# Patient Record
Sex: Male | Born: 1935 | Race: Black or African American | Hispanic: No | Marital: Married | Smoking: Former smoker
Health system: Southern US, Community
[De-identification: ages and names within clinical notes are randomized; demographics above are authoritative.]

## PROBLEM LIST (undated history)

## (undated) DIAGNOSIS — I2510 Atherosclerotic heart disease of native coronary artery without angina pectoris: Secondary | ICD-10-CM

## (undated) DIAGNOSIS — M069 Rheumatoid arthritis, unspecified: Secondary | ICD-10-CM

## (undated) DIAGNOSIS — I509 Heart failure, unspecified: Secondary | ICD-10-CM

## (undated) DIAGNOSIS — Z860101 Personal history of adenomatous and serrated colon polyps: Secondary | ICD-10-CM

## (undated) DIAGNOSIS — I10 Essential (primary) hypertension: Secondary | ICD-10-CM

## (undated) DIAGNOSIS — Z86010 Personal history of colonic polyps: Secondary | ICD-10-CM

## (undated) DIAGNOSIS — N289 Disorder of kidney and ureter, unspecified: Secondary | ICD-10-CM

## (undated) DIAGNOSIS — E785 Hyperlipidemia, unspecified: Secondary | ICD-10-CM

## (undated) DIAGNOSIS — R911 Solitary pulmonary nodule: Secondary | ICD-10-CM

## (undated) DIAGNOSIS — I219 Acute myocardial infarction, unspecified: Secondary | ICD-10-CM

## (undated) HISTORY — DX: Rheumatoid arthritis, unspecified: M06.9

## (undated) HISTORY — DX: Heart failure, unspecified: I50.9

## (undated) HISTORY — DX: Disorder of kidney and ureter, unspecified: N28.9

## (undated) HISTORY — PX: HERNIA REPAIR: SHX51

## (undated) HISTORY — DX: Personal history of adenomatous and serrated colon polyps: Z86.0101

## (undated) HISTORY — DX: Personal history of colonic polyps: Z86.010

## (undated) HISTORY — DX: Hyperlipidemia, unspecified: E78.5

## (undated) HISTORY — DX: Acute myocardial infarction, unspecified: I21.9

## (undated) HISTORY — PX: ANGIOPLASTY: SHX39

## (undated) HISTORY — DX: Essential (primary) hypertension: I10

## (undated) HISTORY — DX: Atherosclerotic heart disease of native coronary artery without angina pectoris: I25.10

## (undated) HISTORY — DX: Solitary pulmonary nodule: R91.1

---

## 1998-03-05 ENCOUNTER — Ambulatory Visit (HOSPITAL_COMMUNITY): Admission: RE | Admit: 1998-03-05 | Discharge: 1998-03-05 | Payer: Self-pay | Admitting: Nephrology

## 1998-04-20 ENCOUNTER — Ambulatory Visit (HOSPITAL_COMMUNITY): Admission: RE | Admit: 1998-04-20 | Discharge: 1998-04-20 | Payer: Self-pay | Admitting: Nephrology

## 1999-02-23 ENCOUNTER — Encounter: Payer: Self-pay | Admitting: Internal Medicine

## 2000-02-15 ENCOUNTER — Ambulatory Visit (HOSPITAL_COMMUNITY): Admission: RE | Admit: 2000-02-15 | Discharge: 2000-02-15 | Payer: Self-pay | Admitting: Internal Medicine

## 2000-02-15 ENCOUNTER — Encounter: Payer: Self-pay | Admitting: Internal Medicine

## 2000-02-23 ENCOUNTER — Ambulatory Visit (HOSPITAL_COMMUNITY): Admission: RE | Admit: 2000-02-23 | Discharge: 2000-02-23 | Payer: Self-pay | Admitting: Internal Medicine

## 2000-02-23 ENCOUNTER — Encounter: Payer: Self-pay | Admitting: Internal Medicine

## 2000-03-20 ENCOUNTER — Encounter: Admission: RE | Admit: 2000-03-20 | Discharge: 2000-04-05 | Payer: Self-pay | Admitting: Internal Medicine

## 2000-04-04 ENCOUNTER — Encounter: Admission: RE | Admit: 2000-04-04 | Discharge: 2000-07-03 | Payer: Self-pay | Admitting: Internal Medicine

## 2000-10-09 HISTORY — PX: CORONARY STENT PLACEMENT: SHX1402

## 2000-11-23 ENCOUNTER — Inpatient Hospital Stay (HOSPITAL_COMMUNITY): Admission: EM | Admit: 2000-11-23 | Discharge: 2000-11-29 | Payer: Self-pay | Admitting: Emergency Medicine

## 2000-11-23 ENCOUNTER — Encounter: Payer: Self-pay | Admitting: Emergency Medicine

## 2000-11-27 ENCOUNTER — Encounter: Payer: Self-pay | Admitting: *Deleted

## 2004-09-15 ENCOUNTER — Ambulatory Visit: Payer: Self-pay | Admitting: Internal Medicine

## 2005-01-12 ENCOUNTER — Ambulatory Visit: Payer: Self-pay | Admitting: Internal Medicine

## 2005-02-01 ENCOUNTER — Ambulatory Visit: Payer: Self-pay | Admitting: Internal Medicine

## 2005-04-26 ENCOUNTER — Ambulatory Visit: Payer: Self-pay | Admitting: Internal Medicine

## 2005-05-03 ENCOUNTER — Ambulatory Visit: Payer: Self-pay | Admitting: Internal Medicine

## 2005-06-13 ENCOUNTER — Ambulatory Visit: Payer: Self-pay | Admitting: Internal Medicine

## 2005-06-15 ENCOUNTER — Encounter: Admission: RE | Admit: 2005-06-15 | Discharge: 2005-06-15 | Payer: Self-pay | Admitting: Internal Medicine

## 2005-06-19 ENCOUNTER — Ambulatory Visit: Payer: Self-pay | Admitting: Internal Medicine

## 2005-08-23 ENCOUNTER — Ambulatory Visit: Payer: Self-pay | Admitting: Internal Medicine

## 2005-12-22 ENCOUNTER — Ambulatory Visit: Payer: Self-pay | Admitting: Internal Medicine

## 2006-02-01 ENCOUNTER — Ambulatory Visit: Payer: Self-pay | Admitting: Internal Medicine

## 2006-03-22 ENCOUNTER — Ambulatory Visit: Payer: Self-pay | Admitting: Internal Medicine

## 2006-03-29 ENCOUNTER — Ambulatory Visit: Payer: Self-pay | Admitting: Internal Medicine

## 2006-07-11 ENCOUNTER — Encounter: Payer: Self-pay | Admitting: Internal Medicine

## 2006-07-11 ENCOUNTER — Ambulatory Visit: Payer: Self-pay | Admitting: Internal Medicine

## 2006-07-11 DIAGNOSIS — E785 Hyperlipidemia, unspecified: Secondary | ICD-10-CM

## 2006-07-11 DIAGNOSIS — I252 Old myocardial infarction: Secondary | ICD-10-CM

## 2006-07-11 DIAGNOSIS — I10 Essential (primary) hypertension: Secondary | ICD-10-CM | POA: Insufficient documentation

## 2006-07-11 DIAGNOSIS — I509 Heart failure, unspecified: Secondary | ICD-10-CM | POA: Insufficient documentation

## 2006-07-11 DIAGNOSIS — E118 Type 2 diabetes mellitus with unspecified complications: Secondary | ICD-10-CM

## 2006-07-11 DIAGNOSIS — E1165 Type 2 diabetes mellitus with hyperglycemia: Secondary | ICD-10-CM

## 2006-10-23 ENCOUNTER — Ambulatory Visit: Payer: Self-pay | Admitting: Internal Medicine

## 2006-10-23 LAB — CONVERTED CEMR LAB
ALT: 17 units/L (ref 0–40)
AST: 20 units/L (ref 0–37)
Albumin: 3.3 g/dL — ABNORMAL LOW (ref 3.5–5.2)
Alkaline Phosphatase: 63 units/L (ref 39–117)
BUN: 24 mg/dL — ABNORMAL HIGH (ref 6–23)
Bilirubin, Direct: 0.2 mg/dL (ref 0.0–0.3)
CO2: 27 meq/L (ref 19–32)
Calcium: 9 mg/dL (ref 8.4–10.5)
Chloride: 101 meq/L (ref 96–112)
Cholesterol: 145 mg/dL (ref 0–200)
Creatinine, Ser: 1.7 mg/dL — ABNORMAL HIGH (ref 0.4–1.5)
GFR calc Af Amer: 51 mL/min
GFR calc non Af Amer: 42 mL/min
Glucose, Bld: 134 mg/dL — ABNORMAL HIGH (ref 70–99)
HDL: 29.9 mg/dL — ABNORMAL LOW (ref >39.0)
Hgb A1c MFr Bld: 6.9 % — ABNORMAL HIGH (ref 4.6–6.0)
LDL Cholesterol: 87 mg/dL (ref 0–99)
Potassium: 4 meq/L (ref 3.5–5.1)
Sodium: 136 meq/L (ref 135–145)
Total Bilirubin: 1 mg/dL (ref 0.3–1.2)
Total CHOL/HDL Ratio: 4.8
Total Protein: 7.1 g/dL (ref 6.0–8.3)
Triglycerides: 141 mg/dL (ref 0–149)
VLDL: 28 mg/dL (ref 0–40)

## 2007-01-23 ENCOUNTER — Ambulatory Visit: Payer: Self-pay | Admitting: Internal Medicine

## 2007-02-18 ENCOUNTER — Inpatient Hospital Stay (HOSPITAL_COMMUNITY): Admission: EM | Admit: 2007-02-18 | Discharge: 2007-02-20 | Payer: Self-pay | Admitting: Emergency Medicine

## 2007-02-18 ENCOUNTER — Ambulatory Visit: Payer: Self-pay | Admitting: Internal Medicine

## 2007-02-18 ENCOUNTER — Ambulatory Visit: Payer: Self-pay | Admitting: Cardiology

## 2007-02-22 ENCOUNTER — Encounter: Payer: Self-pay | Admitting: Internal Medicine

## 2007-02-22 ENCOUNTER — Ambulatory Visit: Payer: Self-pay | Admitting: Internal Medicine

## 2007-02-22 ENCOUNTER — Ambulatory Visit: Payer: Self-pay | Admitting: Cardiology

## 2007-02-22 ENCOUNTER — Ambulatory Visit: Payer: Self-pay

## 2007-02-22 LAB — CONVERTED CEMR LAB
BUN: 23 mg/dL (ref 6–23)
CO2: 27 meq/L (ref 19–32)
Calcium: 9.2 mg/dL (ref 8.4–10.5)
Chloride: 103 meq/L (ref 96–112)
Creatinine, Ser: 1.8 mg/dL — ABNORMAL HIGH (ref 0.4–1.5)
GFR calc Af Amer: 48 mL/min
GFR calc Af Amer: 51 mL/min
GFR calc non Af Amer: 40 mL/min
GFR calc non Af Amer: 42 mL/min
Glucose, Bld: 130 mg/dL — ABNORMAL HIGH (ref 70–99)
Potassium: 4.4 meq/L (ref 3.5–5.1)
Potassium: 4 meq/L (ref 3.5–5.1)
Sodium: 139 meq/L (ref 135–145)
Sodium: 137 meq/L (ref 135–145)

## 2007-03-07 ENCOUNTER — Ambulatory Visit: Payer: Self-pay | Admitting: Cardiology

## 2007-03-15 ENCOUNTER — Ambulatory Visit: Payer: Self-pay | Admitting: Internal Medicine

## 2007-03-15 LAB — CONVERTED CEMR LAB
ALT: 18 units/L (ref 0–40)
AST: 19 units/L (ref 0–37)
Albumin: 3.4 g/dL — ABNORMAL LOW (ref 3.5–5.2)
Alkaline Phosphatase: 63 units/L (ref 39–117)
BUN: 22 mg/dL (ref 6–23)
Bilirubin, Direct: 0.1 mg/dL (ref 0.0–0.3)
CO2: 28 meq/L (ref 19–32)
Calcium: 9.3 mg/dL (ref 8.4–10.5)
Chloride: 107 meq/L (ref 96–112)
Cholesterol: 138 mg/dL (ref 0–200)
Creatinine, Ser: 1.8 mg/dL — ABNORMAL HIGH (ref 0.4–1.5)
GFR calc Af Amer: 48 mL/min
GFR calc non Af Amer: 40 mL/min
Glucose, Bld: 109 mg/dL — ABNORMAL HIGH (ref 70–99)
HDL: 27.7 mg/dL — ABNORMAL LOW (ref >39.0)
Hgb A1c MFr Bld: 7.1 % — ABNORMAL HIGH (ref 4.6–6.0)
LDL Cholesterol: 89 mg/dL (ref 0–99)
Potassium: 4.8 meq/L (ref 3.5–5.1)
Sodium: 140 meq/L (ref 135–145)
Total Bilirubin: 1 mg/dL (ref 0.3–1.2)
Total CHOL/HDL Ratio: 5
Total Protein: 7.6 g/dL (ref 6.0–8.3)
Triglycerides: 105 mg/dL (ref 0–149)
VLDL: 21 mg/dL (ref 0–40)

## 2007-03-21 DIAGNOSIS — N259 Disorder resulting from impaired renal tubular function, unspecified: Secondary | ICD-10-CM | POA: Insufficient documentation

## 2007-03-21 DIAGNOSIS — I2510 Atherosclerotic heart disease of native coronary artery without angina pectoris: Secondary | ICD-10-CM | POA: Insufficient documentation

## 2007-04-22 ENCOUNTER — Encounter: Payer: Self-pay | Admitting: Internal Medicine

## 2007-04-22 ENCOUNTER — Ambulatory Visit: Payer: Self-pay | Admitting: Internal Medicine

## 2007-05-22 ENCOUNTER — Ambulatory Visit: Payer: Self-pay | Admitting: Internal Medicine

## 2007-05-22 LAB — CONVERTED CEMR LAB
Cholesterol, target level: 200 mg/dL
HDL goal, serum: 40 mg/dL
LDL Goal: 70 mg/dL

## 2007-05-28 ENCOUNTER — Telehealth (INDEPENDENT_AMBULATORY_CARE_PROVIDER_SITE_OTHER): Payer: Self-pay | Admitting: *Deleted

## 2007-07-03 ENCOUNTER — Ambulatory Visit: Payer: Self-pay | Admitting: Internal Medicine

## 2007-07-04 LAB — CONVERTED CEMR LAB
ALT: 14 units/L (ref 0–53)
AST: 20 units/L (ref 0–37)
Albumin: 3.8 g/dL (ref 3.5–5.2)
Alkaline Phosphatase: 69 units/L (ref 39–117)
BUN: 20 mg/dL (ref 6–23)
Bilirubin, Direct: 0.2 mg/dL (ref 0.0–0.3)
CO2: 29 meq/L (ref 19–32)
Calcium: 9.2 mg/dL (ref 8.4–10.5)
Chloride: 109 meq/L (ref 96–112)
Cholesterol: 161 mg/dL (ref 0–200)
Creatinine, Ser: 1.6 mg/dL — ABNORMAL HIGH (ref 0.4–1.5)
GFR calc Af Amer: 55 mL/min
GFR calc non Af Amer: 45 mL/min
Glucose, Bld: 115 mg/dL — ABNORMAL HIGH (ref 70–99)
HDL: 35.4 mg/dL — ABNORMAL LOW (ref >39.0)
Hgb A1c MFr Bld: 6.3 % — ABNORMAL HIGH (ref 4.6–6.0)
LDL Cholesterol: 96 mg/dL (ref 0–99)
Potassium: 4.5 meq/L (ref 3.5–5.1)
Sodium: 143 meq/L (ref 135–145)
Total Bilirubin: 0.8 mg/dL (ref 0.3–1.2)
Total CHOL/HDL Ratio: 4.5
Total Protein: 7.4 g/dL (ref 6.0–8.3)
Triglycerides: 147 mg/dL (ref 0–149)
VLDL: 29 mg/dL (ref 0–40)

## 2007-07-22 ENCOUNTER — Ambulatory Visit: Payer: Self-pay | Admitting: Internal Medicine

## 2007-08-13 ENCOUNTER — Ambulatory Visit: Payer: Self-pay

## 2007-08-13 ENCOUNTER — Encounter: Payer: Self-pay | Admitting: Internal Medicine

## 2007-09-25 ENCOUNTER — Ambulatory Visit: Payer: Self-pay | Admitting: Cardiology

## 2007-09-25 LAB — CONVERTED CEMR LAB
Chloride: 102 meq/L (ref 96–112)
Creatinine, Ser: 1.7 mg/dL — ABNORMAL HIGH (ref 0.4–1.5)
Glucose, Bld: 147 mg/dL — ABNORMAL HIGH (ref 70–99)
Sodium: 139 meq/L (ref 135–145)

## 2007-10-22 ENCOUNTER — Ambulatory Visit: Payer: Self-pay | Admitting: Internal Medicine

## 2007-10-22 LAB — CONVERTED CEMR LAB
ALT: 16 units/L (ref 0–53)
Albumin: 3.7 g/dL (ref 3.5–5.2)
Alkaline Phosphatase: 58 units/L (ref 39–117)
BUN: 23 mg/dL (ref 6–23)
Bilirubin, Direct: 0.2 mg/dL (ref 0.0–0.3)
Calcium: 9 mg/dL (ref 8.4–10.5)
GFR calc Af Amer: 55 mL/min
GFR calc non Af Amer: 45 mL/min
LDL Cholesterol: 93 mg/dL (ref 0–99)
Total CHOL/HDL Ratio: 4.7
VLDL: 33 mg/dL (ref 0–40)

## 2007-10-29 ENCOUNTER — Ambulatory Visit: Payer: Self-pay | Admitting: Internal Medicine

## 2007-12-02 ENCOUNTER — Ambulatory Visit: Payer: Self-pay

## 2008-02-21 ENCOUNTER — Ambulatory Visit: Payer: Self-pay | Admitting: Internal Medicine

## 2008-02-21 LAB — CONVERTED CEMR LAB
ALT: 15 units/L (ref 0–53)
CO2: 28 meq/L (ref 19–32)
Calcium: 8.9 mg/dL (ref 8.4–10.5)
Creatinine, Ser: 1.6 mg/dL — ABNORMAL HIGH (ref 0.4–1.5)
HDL: 30 mg/dL — ABNORMAL LOW (ref >39.0)
Hgb A1c MFr Bld: 6.8 % — ABNORMAL HIGH (ref 4.6–6.0)
LDL Cholesterol: 101 mg/dL — ABNORMAL HIGH (ref 0–99)
Total Bilirubin: 0.7 mg/dL (ref 0.3–1.2)
Total CHOL/HDL Ratio: 5.1
Total Protein: 7.3 g/dL (ref 6.0–8.3)
Triglycerides: 107 mg/dL (ref 0–149)

## 2008-02-28 ENCOUNTER — Ambulatory Visit: Payer: Self-pay | Admitting: Internal Medicine

## 2008-05-11 ENCOUNTER — Ambulatory Visit: Payer: Self-pay | Admitting: Internal Medicine

## 2008-05-12 ENCOUNTER — Ambulatory Visit: Payer: Self-pay | Admitting: Internal Medicine

## 2008-05-15 ENCOUNTER — Ambulatory Visit: Payer: Self-pay | Admitting: Internal Medicine

## 2008-06-18 ENCOUNTER — Ambulatory Visit: Payer: Self-pay | Admitting: Internal Medicine

## 2008-06-18 LAB — CONVERTED CEMR LAB
ALT: 15 units/L (ref 0–53)
AST: 19 units/L (ref 0–37)
Albumin: 3.6 g/dL (ref 3.5–5.2)
Alkaline Phosphatase: 48 units/L (ref 39–117)
BUN: 19 mg/dL (ref 6–23)
CO2: 27 meq/L (ref 19–32)
Chloride: 105 meq/L (ref 96–112)
Cholesterol: 152 mg/dL (ref 0–200)
Glucose, Bld: 103 mg/dL — ABNORMAL HIGH (ref 70–99)
Potassium: 4.3 meq/L (ref 3.5–5.1)
Total Protein: 7.6 g/dL (ref 6.0–8.3)
VLDL: 27 mg/dL (ref 0–40)

## 2008-06-26 ENCOUNTER — Ambulatory Visit: Payer: Self-pay | Admitting: Internal Medicine

## 2008-09-09 ENCOUNTER — Ambulatory Visit: Payer: Self-pay | Admitting: Cardiology

## 2008-10-16 ENCOUNTER — Ambulatory Visit: Payer: Self-pay | Admitting: Internal Medicine

## 2008-10-16 LAB — CONVERTED CEMR LAB
ALT: 15 units/L (ref 0–53)
AST: 20 units/L (ref 0–37)
Bilirubin, Direct: 0.1 mg/dL (ref 0.0–0.3)
CO2: 30 meq/L (ref 19–32)
Chloride: 109 meq/L (ref 96–112)
GFR calc non Af Amer: 49 mL/min
Sodium: 142 meq/L (ref 135–145)
Total Bilirubin: 0.9 mg/dL (ref 0.3–1.2)
Total CHOL/HDL Ratio: 3.7

## 2008-10-20 ENCOUNTER — Ambulatory Visit: Payer: Self-pay | Admitting: Internal Medicine

## 2009-02-17 ENCOUNTER — Ambulatory Visit: Payer: Self-pay | Admitting: Internal Medicine

## 2009-02-17 LAB — CONVERTED CEMR LAB
BUN: 25 mg/dL — ABNORMAL HIGH (ref 6–23)
Bilirubin, Direct: 0.2 mg/dL (ref 0.0–0.3)
Chloride: 112 meq/L (ref 96–112)
Cholesterol: 122 mg/dL (ref 0–200)
GFR calc non Af Amer: 54.56 mL/min (ref >60)
HDL: 33.2 mg/dL — ABNORMAL LOW (ref >39.00)
Hgb A1c MFr Bld: 6.7 % — ABNORMAL HIGH (ref 4.6–6.5)
LDL Cholesterol: 73 mg/dL (ref 0–99)
Potassium: 4.4 meq/L (ref 3.5–5.1)
Sodium: 142 meq/L (ref 135–145)
Total Bilirubin: 0.8 mg/dL (ref 0.3–1.2)
Total Protein: 6.8 g/dL (ref 6.0–8.3)
VLDL: 15.6 mg/dL (ref 0.0–40.0)

## 2009-02-24 ENCOUNTER — Ambulatory Visit: Payer: Self-pay | Admitting: Internal Medicine

## 2009-03-15 ENCOUNTER — Ambulatory Visit: Payer: Self-pay | Admitting: Family Medicine

## 2009-03-17 ENCOUNTER — Ambulatory Visit: Payer: Self-pay | Admitting: Internal Medicine

## 2009-04-21 ENCOUNTER — Ambulatory Visit: Payer: Self-pay | Admitting: Internal Medicine

## 2009-04-26 ENCOUNTER — Telehealth: Payer: Self-pay | Admitting: Internal Medicine

## 2009-05-18 ENCOUNTER — Telehealth: Payer: Self-pay | Admitting: Internal Medicine

## 2009-06-16 ENCOUNTER — Ambulatory Visit: Payer: Self-pay | Admitting: Internal Medicine

## 2009-06-16 LAB — CONVERTED CEMR LAB
ALT: 13 units/L (ref 0–53)
AST: 18 units/L (ref 0–37)
Albumin: 3.3 g/dL — ABNORMAL LOW (ref 3.5–5.2)
Calcium: 8.7 mg/dL (ref 8.4–10.5)
GFR calc non Af Amer: 50.83 mL/min (ref >60)
HDL: 34.5 mg/dL — ABNORMAL LOW (ref >39.00)
Hgb A1c MFr Bld: 6.6 % — ABNORMAL HIGH (ref 4.6–6.5)
Potassium: 4.2 meq/L (ref 3.5–5.1)
Sodium: 140 meq/L (ref 135–145)
Total Bilirubin: 0.8 mg/dL (ref 0.3–1.2)
Total Protein: 7.5 g/dL (ref 6.0–8.3)
Triglycerides: 106 mg/dL (ref 0.0–149.0)
VLDL: 21.2 mg/dL (ref 0.0–40.0)

## 2009-06-23 ENCOUNTER — Ambulatory Visit: Payer: Self-pay | Admitting: Internal Medicine

## 2009-06-23 LAB — CONVERTED CEMR LAB: Anti Nuclear Antibody(ANA): NEGATIVE

## 2009-06-24 ENCOUNTER — Ambulatory Visit: Payer: Self-pay | Admitting: Internal Medicine

## 2009-06-24 LAB — CONVERTED CEMR LAB: Rhuematoid fact SerPl-aCnc: 270.2 intl units/mL — ABNORMAL HIGH (ref 0.0–20.0)

## 2009-06-30 LAB — CONVERTED CEMR LAB: Cyclic Citrullin Peptide Ab: 800 units — ABNORMAL HIGH (ref <7)

## 2009-07-05 ENCOUNTER — Ambulatory Visit: Payer: Self-pay | Admitting: Internal Medicine

## 2009-07-05 DIAGNOSIS — M069 Rheumatoid arthritis, unspecified: Secondary | ICD-10-CM | POA: Insufficient documentation

## 2009-07-16 ENCOUNTER — Encounter: Payer: Self-pay | Admitting: Internal Medicine

## 2009-07-19 ENCOUNTER — Ambulatory Visit: Payer: Self-pay | Admitting: Internal Medicine

## 2009-08-03 ENCOUNTER — Telehealth: Payer: Self-pay | Admitting: Internal Medicine

## 2009-08-09 ENCOUNTER — Ambulatory Visit: Payer: Self-pay | Admitting: Internal Medicine

## 2009-08-09 DIAGNOSIS — M109 Gout, unspecified: Secondary | ICD-10-CM

## 2009-08-16 ENCOUNTER — Encounter (INDEPENDENT_AMBULATORY_CARE_PROVIDER_SITE_OTHER): Payer: Self-pay | Admitting: *Deleted

## 2009-09-10 ENCOUNTER — Ambulatory Visit: Payer: Self-pay | Admitting: Cardiology

## 2009-09-13 ENCOUNTER — Telehealth: Payer: Self-pay | Admitting: Internal Medicine

## 2009-09-14 ENCOUNTER — Encounter (INDEPENDENT_AMBULATORY_CARE_PROVIDER_SITE_OTHER): Payer: Self-pay | Admitting: *Deleted

## 2009-09-15 ENCOUNTER — Encounter (INDEPENDENT_AMBULATORY_CARE_PROVIDER_SITE_OTHER): Payer: Self-pay | Admitting: *Deleted

## 2009-09-17 ENCOUNTER — Ambulatory Visit: Payer: Self-pay | Admitting: Cardiology

## 2009-09-27 LAB — CONVERTED CEMR LAB
BUN: 21 mg/dL (ref 6–23)
Calcium: 9 mg/dL (ref 8.4–10.5)
Chloride: 104 meq/L (ref 96–112)
Creatinine, Ser: 1.8 mg/dL — ABNORMAL HIGH (ref 0.4–1.5)

## 2009-12-15 ENCOUNTER — Encounter: Payer: Self-pay | Admitting: Internal Medicine

## 2010-02-15 ENCOUNTER — Encounter: Payer: Self-pay | Admitting: Internal Medicine

## 2010-04-08 LAB — HM DIABETES EYE EXAM: HM Diabetic Eye Exam: NORMAL

## 2010-04-13 ENCOUNTER — Telehealth (INDEPENDENT_AMBULATORY_CARE_PROVIDER_SITE_OTHER): Payer: Self-pay | Admitting: *Deleted

## 2010-04-13 ENCOUNTER — Encounter: Payer: Self-pay | Admitting: Internal Medicine

## 2010-05-03 ENCOUNTER — Ambulatory Visit: Payer: Self-pay | Admitting: Internal Medicine

## 2010-05-05 LAB — CONVERTED CEMR LAB
Albumin: 3.7 g/dL (ref 3.5–5.2)
Basophils Absolute: 0 10*3/uL (ref 0.0–0.1)
CO2: 29 meq/L (ref 19–32)
Calcium: 8.9 mg/dL (ref 8.4–10.5)
Cholesterol: 162 mg/dL (ref 0–200)
Creatinine, Ser: 1.6 mg/dL — ABNORMAL HIGH (ref 0.4–1.5)
Direct LDL: 81.2 mg/dL
Eosinophils Relative: 1.2 % (ref 0.0–5.0)
GFR calc non Af Amer: 55.18 mL/min (ref >60)
Glucose, Bld: 125 mg/dL — ABNORMAL HIGH (ref 70–99)
HCT: 38.8 % — ABNORMAL LOW (ref 39.0–52.0)
Hemoglobin: 12.8 g/dL — ABNORMAL LOW (ref 13.0–17.0)
Hgb A1c MFr Bld: 8.3 % — ABNORMAL HIGH (ref 4.6–6.5)
Lymphs Abs: 1.1 10*3/uL (ref 0.7–4.0)
MCV: 92.7 fL (ref 78.0–100.0)
Monocytes Absolute: 0.7 10*3/uL (ref 0.1–1.0)
Monocytes Relative: 6.3 % (ref 3.0–12.0)
Neutro Abs: 9.2 10*3/uL — ABNORMAL HIGH (ref 1.4–7.7)
PSA: 2.07 ng/mL (ref 0.10–4.00)
Platelets: 140 10*3/uL — ABNORMAL LOW (ref 150.0–400.0)
RDW: 16.9 % — ABNORMAL HIGH (ref 11.5–14.6)
Sodium: 137 meq/L (ref 135–145)
Total CHOL/HDL Ratio: 4
Total Protein: 7 g/dL (ref 6.0–8.3)
Triglycerides: 215 mg/dL — ABNORMAL HIGH (ref 0.0–149.0)

## 2010-05-19 ENCOUNTER — Encounter: Payer: Self-pay | Admitting: Internal Medicine

## 2010-05-27 ENCOUNTER — Encounter (INDEPENDENT_AMBULATORY_CARE_PROVIDER_SITE_OTHER): Payer: Self-pay | Admitting: *Deleted

## 2010-06-15 ENCOUNTER — Telehealth: Payer: Self-pay | Admitting: Internal Medicine

## 2010-06-15 ENCOUNTER — Ambulatory Visit: Payer: Self-pay | Admitting: Internal Medicine

## 2010-06-15 DIAGNOSIS — D649 Anemia, unspecified: Secondary | ICD-10-CM

## 2010-06-17 LAB — CONVERTED CEMR LAB
Albumin: 3.8 g/dL (ref 3.5–5.2)
Basophils Absolute: 0 10*3/uL (ref 0.0–0.1)
CO2: 27 meq/L (ref 19–32)
Calcium: 9.1 mg/dL (ref 8.4–10.5)
Creatinine, Ser: 1.5 mg/dL (ref 0.4–1.5)
Eosinophils Absolute: 0.2 10*3/uL (ref 0.0–0.7)
Folate: 11.9 ng/mL
Iron: 85 ug/dL (ref 42–165)
Lymphocytes Relative: 21.5 % (ref 12.0–46.0)
MCHC: 32.9 g/dL (ref 30.0–36.0)
Monocytes Relative: 11.7 % (ref 3.0–12.0)
Neutro Abs: 4.4 10*3/uL (ref 1.4–7.7)
Neutrophils Relative %: 63.7 % (ref 43.0–77.0)
Platelets: 137 10*3/uL — ABNORMAL LOW (ref 150.0–400.0)
RDW: 15.2 % — ABNORMAL HIGH (ref 11.5–14.6)
Sodium: 139 meq/L (ref 135–145)
TSH: 1.43 microintl units/mL (ref 0.35–5.50)
Vitamin B-12: 395 pg/mL (ref 211–911)

## 2010-07-27 ENCOUNTER — Ambulatory Visit: Payer: Self-pay | Admitting: Internal Medicine

## 2010-08-18 ENCOUNTER — Telehealth: Payer: Self-pay | Admitting: Internal Medicine

## 2010-09-06 ENCOUNTER — Ambulatory Visit: Payer: Self-pay | Admitting: Internal Medicine

## 2010-09-06 LAB — CONVERTED CEMR LAB
ALT: 19 units/L (ref 0–53)
GFR calc non Af Amer: 51.71 mL/min (ref >60)
Glucose, Bld: 98 mg/dL (ref 70–99)
HDL: 32.7 mg/dL — ABNORMAL LOW (ref >39.00)
Hgb A1c MFr Bld: 7.7 % — ABNORMAL HIGH (ref 4.6–6.5)
LDL Cholesterol: 56 mg/dL (ref 0–99)
Potassium: 3.8 meq/L (ref 3.5–5.1)
Sodium: 139 meq/L (ref 135–145)
Total Bilirubin: 0.7 mg/dL (ref 0.3–1.2)
Total Protein: 7.6 g/dL (ref 6.0–8.3)
VLDL: 36 mg/dL (ref 0.0–40.0)

## 2010-09-08 ENCOUNTER — Ambulatory Visit: Payer: Self-pay | Admitting: Cardiology

## 2010-09-08 ENCOUNTER — Encounter: Payer: Self-pay | Admitting: Cardiology

## 2010-09-13 ENCOUNTER — Ambulatory Visit: Payer: Self-pay | Admitting: Internal Medicine

## 2010-09-14 ENCOUNTER — Telehealth: Payer: Self-pay | Admitting: *Deleted

## 2010-09-20 ENCOUNTER — Telehealth: Payer: Self-pay | Admitting: Internal Medicine

## 2010-10-25 ENCOUNTER — Ambulatory Visit
Admission: RE | Admit: 2010-10-25 | Discharge: 2010-10-25 | Payer: Self-pay | Source: Home / Self Care | Attending: Internal Medicine | Admitting: Internal Medicine

## 2010-10-30 ENCOUNTER — Encounter: Payer: Self-pay | Admitting: Cardiology

## 2010-11-08 NOTE — Miscellaneous (Signed)
Summary: Orders Update   Clinical Lists Changes  Orders: Added new Referral order of Gastroenterology Referral (GI) - Signed 

## 2010-11-08 NOTE — Assessment & Plan Note (Signed)
Summary: f1y  Medications Added HYDROCORTISONE-ACETIC ACID 1-2 % SOLN (HYDROCORTISONE-ACETIC ACID) as needed      Allergies Added: NKDA  Visit Type:  1 year follow up Primary Provider:  Birdie Sons MD  CC:  No complaints.  History of Present Illness: The patient is 77 years old and return for management of CAD. He had remote stenting of the LAD and PTCA of the circumflex artery. In 2008 he had PTCA of the mid right coronary artery. It was a difficult procedure. The vessel is heavily calcified and we were not able to get a stent to the lesion.  He's done well since that time has had no recent chest pain.   His other problems include hyperlipidemia hypertension and diabetes. He also has chronic renal insufficiency with a creatinine ranging 1.7. His last lipid profile in July was excellent    Current Medications (verified): 1)  Lopressor 50 Mg Tabs (Metoprolol Tartrate) .... Take 1 Tablet By Mouth Two Times A Day 2)  Glucovance 2.5-500 Mg Tabs (Glyburide-Metformin) .... Take 1 Tablet By Mouth Two Times A Day 3)  Lantus 100 Unit/ml Soln (Insulin Glargine) .Marland Kitchen.. 11 Units Subcutaneos Every Morning 4)  Aspir-Low 81 Mg Tbec (Aspirin) .... Take 1 Tablet By Mouth Once A Day 5)  Amlodipine Besylate 10 Mg  Tabs (Amlodipine Besylate) .... Once Daily 6)  Tylenol 8 Hour 650 Mg  Tbcr (Acetaminophen) .... As Needed 7)  Onetouch Test   Strp (Glucose Blood) .... Use As Directed Once  or Twice Daily 8)  Simvastatin 40 Mg Tabs (Simvastatin) .... Take One Tablet At Bedtime 9)  Voltaren 1 % Gel (Diclofenac Sodium) .... Apply Two Times A Day To Wrist Two Times A Day As Needed 10)  Hydrochlorothiazide 25 Mg Tabs (Hydrochlorothiazide) .... Take One Tablet By Mouth Daily. 11)  Folic Acid 1 Mg Tabs (Folic Acid) .... Once Daily 12)  Hydrocortisone-Acetic Acid 1-2 % Soln (Hydrocortisone-Acetic Acid) .... As Needed  Allergies (verified): No Known Drug Allergies  Past History:  Past Medical  History: Reviewed history from 09/09/2009 and no changes required. Congestive heart failure Diabetes mellitus, type II Hyperlipidemia Hypertension Myocardial infarction, hx of dm nephropathy--microalbuminuria  2006 renal insufficiency Coronary artery disease Renal insufficiency 1. Coronary artery disease status post multiple percutaneous     interventions.  The last of which was percutaneous transluminal     coronary angioplasty of the mid-right coronary artery, now stable. 2. Good left ventricular function. 3. Hypertension. 4. Diabetes. 5. Hyperlipidemia. 6. Pulmonary nodule by a CT scan. 7. Chronic renal insufficiency with creatinine in the range of 1.7.   Review of Systems       ROS is negative except as outlined in HPI.   Vital Signs:  Patient profile:   77 year old male Height:      68 inches Weight:      230.25 pounds BMI:     35.14 Pulse rate:   59 / minute Pulse rhythm:   irregular Resp:     18 per minute BP sitting:   130 / 78  (left arm) Cuff size:   large  Vitals Entered By: Vikki Ports (September 08, 2010 11:47 AM)  Physical Exam  Additional Exam:  Gen. Well-nourished, in no distress   Neck: No JVD, thyroid not enlarged, no carotid bruits Lungs: No tachypnea, clear without rales, rhonchi or wheezes Cardiovascular: Rhythm regular, PMI not displaced,  heart sounds  normal, no murmurs or gallops, no peripheral edema, pulses normal in all 4  extremities. Abdomen: BS normal, abdomen soft and non-tender without masses or organomegaly, no hepatosplenomegaly. MS: No deformities, no cyanosis or clubbing   Neuro:  No focal sns   Skin:  no lesions    Impression & Recommendations:  Problem # 1:  CORONARY ARTERY DISEASE (ICD-414.00) He has had previous PCI procedures as described in the history of present illness. He has had no chest pain his problem appears stable. His updated medication list for this problem includes:    Lopressor 50 Mg Tabs (Metoprolol  tartrate) .Marland Kitchen... Take 1 tablet by mouth two times a day    Aspir-low 81 Mg Tbec (Aspirin) .Marland Kitchen... Take 1 tablet by mouth once a day    Amlodipine Besylate 10 Mg Tabs (Amlodipine besylate) ..... Once daily  Orders: EKG w/ Interpretation (93000)  Problem # 2:  HYPERLIPIDEMIA (ICD-272.4) He had an excellent lipid profile a few months ago. We will continue current therapy. His updated medication list for this problem includes:    Simvastatin 40 Mg Tabs (Simvastatin) .Marland Kitchen... Take one tablet at bedtime  Problem # 3:  HYPERTENSION (ICD-401.9) His blood pressure is well-controlled on current therapy. His updated medication list for this problem includes:    Lopressor 50 Mg Tabs (Metoprolol tartrate) .Marland Kitchen... Take 1 tablet by mouth two times a day    Aspir-low 81 Mg Tbec (Aspirin) .Marland Kitchen... Take 1 tablet by mouth once a day    Amlodipine Besylate 10 Mg Tabs (Amlodipine besylate) ..... Once daily    Hydrochlorothiazide 25 Mg Tabs (Hydrochlorothiazide) .Marland Kitchen... Take one tablet by mouth daily.  Patient Instructions: 1)  Your physician recommends that you continue on your current medications as directed. Please refer to the Current Medication list given to you today. 2)  Your physician wants you to follow-up in: 1 year with Dr. Clifton James.   You will receive a reminder letter in the mail two months in advance. If you don't receive a letter, please call our office to schedule the follow-up appointment.

## 2010-11-08 NOTE — Letter (Signed)
Summary: Referral - not able to see patient  [facility]  [address] [place], [place] [address]   Phone: [phone]  Fax: [phone]    May 27, 2010    Birdie Sons, M.D. [address] [place], [place] [address]   Re:   Matthew Bean DOB:  1933/02/08 MRN:   [id number]    Dear Dr. Cato Mulligan:  Thank you for your kind referral of the above patient.  We have attempted to schedule the recommended procedure Screening Colonoscopy but have not been able to schedule because:  ___ The patient was not available by phone and/or has not returned our calls.   X  The patient declined to schedule the procedure at this time.  We appreciate the referral and hope that we will have the opportunity to treat this patient in the future.    Sincerely,    [facility] [phone]

## 2010-11-08 NOTE — Letter (Signed)
Summary: Rheumatology-Dr. Stacey Drain  Rheumatology-Dr. Stacey Drain   Imported By: Maryln Gottron 04/29/2010 13:10:49  _____________________________________________________________________  External Attachment:    Type:   Image     Comment:   External Document

## 2010-11-08 NOTE — Letter (Signed)
Summary: Rheumatology-Dr. Stacey Drain  Rheumatology-Dr. Stacey Drain   Imported By: Maryln Gottron 03/01/2010 13:33:10  _____________________________________________________________________  External Attachment:    Type:   Image     Comment:   External Document

## 2010-11-08 NOTE — Assessment & Plan Note (Signed)
Summary: 6 WK ROV/NJR   Vital Signs:  Patient profile:   77 year old male Weight:      238 pounds Temp:     97.7 degrees F oral Pulse rate:   74 / minute Resp:     16 per minute BP sitting:   162 / 80  Vitals Entered By: Lynann Beaver CMA (July 27, 2010 10:02 AM)  Serial Vital Signs/Assessments:  Time      Position  BP       Pulse  Resp  Temp     By                     135/70                         Birdie Sons MD  CC: rov Is Patient Diabetic? Yes Pain Assessment Patient in pain? no        Primary Care Provider:  Birdie Sons MD  CC:  rov.  History of Present Illness:  Follow-Up Visit      This is a 77 year old man who presents for Follow-up visit.  The patient denies chest pain and palpitations.  Since the last visit the patient notes no new problems or concerns.  The patient reports taking meds as prescribed and not monitoring BP.  When questioned about possible medication side effects, the patient notes none.    All other systems reviewed and were negative   Current Medications (verified): 1)  Lopressor 50 Mg Tabs (Metoprolol Tartrate) .... Take 1 Tablet By Mouth Two Times A Day 2)  Glucovance 2.5-500 Mg Tabs (Glyburide-Metformin) .... Take 1 Tablet By Mouth Two Times A Day 3)  Lantus 100 Unit/ml Soln (Insulin Glargine) .Marland Kitchen.. 11 Units Subcutaneos Every Morning 4)  Aspir-Low 81 Mg Tbec (Aspirin) .... Take 1 Tablet By Mouth Once A Day 5)  Amlodipine Besylate 10 Mg  Tabs (Amlodipine Besylate) .... Once Daily 6)  Tylenol 8 Hour 650 Mg  Tbcr (Acetaminophen) .... As Needed 7)  Onetouch Test   Strp (Glucose Blood) .... Use As Directed Once  or Twice Daily 8)  Simvastatin 40 Mg Tabs (Simvastatin) .... Take One Tablet At Bedtime 9)  Voltaren 1 % Gel (Diclofenac Sodium) .... Apply Two Times A Day To Wrist Two Times A Day As Needed 10)  Hydrochlorothiazide 25 Mg Tabs (Hydrochlorothiazide) .... Take One Tablet By Mouth Daily. 11)  Folic Acid 1 Mg Tabs (Folic Acid) ....  Once Daily  Allergies (verified): No Known Drug Allergies  Past History:  Past Medical History: Last updated: 09/09/2009 Congestive heart failure Diabetes mellitus, type II Hyperlipidemia Hypertension Myocardial infarction, hx of dm nephropathy--microalbuminuria  2006 renal insufficiency Coronary artery disease Renal insufficiency 1. Coronary artery disease status post multiple percutaneous     interventions.  The last of which was percutaneous transluminal     coronary angioplasty of the mid-right coronary artery, now stable. 2. Good left ventricular function. 3. Hypertension. 4. Diabetes. 5. Hyperlipidemia. 6. Pulmonary nodule by a CT scan. 7. Chronic renal insufficiency with creatinine in the range of 1.7.   Past Surgical History: Last updated: 03/18/2007 ventral hernia repair angioplasty 1997 stent placed 2002 (coronary) Inguinal herniorrhaphy stent RCA ( angioplasty)--2008  Family History: Last updated: 07-21-06 father deceased stroke age 43 Mother deceased stroke 59  Social History: Last updated: 21-Jul-2006 Retired Married Former Smoker Alcohol use-no none in 4 months since taking meds for back pain (2007)  Risk  Factors: Exercise: no (05/22/2007)  Risk Factors: Smoking Status: quit > 6 months (07/19/2009) Cigars/wk: 2-3 per week (07/19/2009)  Physical Exam  General:  alert and well-developed.   Head:  normocephalic and atraumatic.   Eyes:  pupils equal and pupils round.   Ears:  R ear normal and L ear normal.   Neck:  No deformities, masses, or tenderness noted. Lungs:  normal respiratory effort.   Heart:  normal rate and regular rhythm.   Abdomen:  soft and non-tender.  overweight Skin:  turgor normal and color normal.   Psych:  normally interactive and good eye contact.     Impression & Recommendations:  Problem # 1:  HYPERTENSION (ICD-401.9) rechecked---will continue to follow on same meds His updated medication list for this problem  includes:    Lopressor 50 Mg Tabs (Metoprolol tartrate) .Marland Kitchen... Take 1 tablet by mouth two times a day    Amlodipine Besylate 10 Mg Tabs (Amlodipine besylate) ..... Once daily    Hydrochlorothiazide 25 Mg Tabs (Hydrochlorothiazide) .Marland Kitchen... Take one tablet by mouth daily.  BP today: 162/80 Prior BP: 120/74 (06/15/2010)  Prior 10 Yr Risk Heart Disease: N/A (05/22/2007)  Labs Reviewed: K+: 3.8 (06/15/2010) Creat: : 1.5 (06/15/2010)   Chol: 162 (05/03/2010)   HDL: 46.10 (05/03/2010)   LDL: 56 (06/16/2009)   TG: 215.0 (05/03/2010)  Problem # 2:  HYPERLIPIDEMIA (ICD-272.4) controlled continue current medications  His updated medication list for this problem includes:    Simvastatin 40 Mg Tabs (Simvastatin) .Marland Kitchen... Take one tablet at bedtime  Labs Reviewed: SGOT: 16 (05/03/2010)   SGPT: 14 (05/03/2010)  Lipid Goals: Chol Goal: 200 (05/22/2007)   HDL Goal: 40 (05/22/2007)   LDL Goal: 70 (05/22/2007)   TG Goal: 150 (05/22/2007)  Prior 10 Yr Risk Heart Disease: N/A (05/22/2007)   HDL:46.10 (05/03/2010), 34.50 (06/16/2009)  LDL:56 (06/16/2009), 73 (02/17/2009)  Chol:162 (05/03/2010), 112 (06/16/2009)  Trig:215.0 (05/03/2010), 106.0 (06/16/2009)  Problem # 3:  DIABETES MELLITUS, TYPE II, CONTROLLED, W/RENAL COMPS (ICD-250.40) not controlled advised weight loss and reguralr exercise His updated medication list for this problem includes:    Glucovance 2.5-500 Mg Tabs (Glyburide-metformin) .Marland Kitchen... Take 1 tablet by mouth two times a day    Lantus 100 Unit/ml Soln (Insulin glargine) .Marland Kitchen... 11 units subcutaneos every morning    Aspir-low 81 Mg Tbec (Aspirin) .Marland Kitchen... Take 1 tablet by mouth once a day  Labs Reviewed: Creat: 1.5 (06/15/2010)     Last Eye Exam: normal-pt's report (04/08/2010) Reviewed HgBA1c results: 8.3 (05/03/2010)  6.6 (06/16/2009)  Complete Medication List: 1)  Lopressor 50 Mg Tabs (Metoprolol tartrate) .... Take 1 tablet by mouth two times a day 2)  Glucovance 2.5-500 Mg Tabs  (Glyburide-metformin) .... Take 1 tablet by mouth two times a day 3)  Lantus 100 Unit/ml Soln (Insulin glargine) .Marland Kitchen.. 11 units subcutaneos every morning 4)  Aspir-low 81 Mg Tbec (Aspirin) .... Take 1 tablet by mouth once a day 5)  Amlodipine Besylate 10 Mg Tabs (Amlodipine besylate) .... Once daily 6)  Tylenol 8 Hour 650 Mg Tbcr (Acetaminophen) .... As needed 7)  Onetouch Test Strp (Glucose blood) .... Use as directed once  or twice daily 8)  Simvastatin 40 Mg Tabs (Simvastatin) .... Take one tablet at bedtime 9)  Voltaren 1 % Gel (Diclofenac sodium) .... Apply two times a day to wrist two times a day as needed 10)  Hydrochlorothiazide 25 Mg Tabs (Hydrochlorothiazide) .... Take one tablet by mouth daily. 11)  Folic Acid 1 Mg Tabs (Folic acid) .Marland KitchenMarland KitchenMarland Kitchen  Once daily  Patient Instructions: 1)  see me 6 weeks 2)  labs one week prior to visit 3)  lipids---272.4 4)  lfts-995.2 5)  bmet-995.2 6)  A1C-250.02 7)       Orders Added: 1)  Est. Patient Level IV [37902]

## 2010-11-08 NOTE — Letter (Signed)
Summary: Generic Letter  [place] [facility]  [address] [place], [place] [address]   Phone: [phone]  Fax: [phone]    12/15/2009  Matthew Bean [address] [place], [place]  [address]  To Whom It May Concern,  The above patient needs Uloric as a medical necessity to treat his gout.  He is unable to take Allopurinol, that you suggest, due to renal failure that would cause adverse effects for him.  Thank you for giving this your consideration for this patient.        Sincerely,      Anothony Bursch H. Elisha Mcgruder, MD

## 2010-11-08 NOTE — Progress Notes (Signed)
Summary: Rx Refill  Phone Note Refill Request Call back at Home Phone [phone] Message from:  Patient on August 18, 2010 8:10 AM  Refills Requested: Medication #1:  VOLTAREN 1 % GEL apply two times a day to wrist two times a day as needed  Method Requested: Electronic Initial call taken by: Trixie Dredge,  August 18, 2010 8:10 AM  Follow-up for Phone Call        Rx called to pharmacy Follow-up by: Alfred Levins, CMA,  August 18, 2010 9:31 AM

## 2010-11-08 NOTE — Assessment & Plan Note (Signed)
Summary: 6 wk rov/njr   Vital Signs:  Patient profile:   77 year old male Height:      68 inches Weight:      233 pounds BMI:     35.56 Temp:     98.3 degrees F oral Pulse rate:   68 / minute BP sitting:   130 / 70  (left arm)  Vitals Entered By: Jeremy Johann CMA (September 13, 2010 8:33 AM) CC: f/u review labs   Primary Care Provider:  Birdie Sons MD  CC:  f/u review labs.  History of Present Illness:  Follow-Up Visit      This is a 77 year old man who presents for Follow-up visit.  The patient denies chest pain and palpitations.  Since the last visit the patient notes no new problems or concerns.  The patient reports taking meds as prescribed, not monitoring BP, dietary noncompliance, and not exercising.  When questioned about possible medication side effects, the patient notes none.  patient has no new problems but he does continue to suffer from symptoms of rheumatoid arthritis. Joints affected are mostly in the hands. He states his hands are stiff in the morning with some swelling. He did to get better during the day but it takes several hours. I have reviewed previous notes by rheumatology. In July was recommended that the patient start methotrexate. Patient took a few doses for a few weeks but then self discontinued.  All other systems reviewed and were negative   Current Problems (verified): 1)  Anemia, Other Unspec  (ICD-285.9) 2)  Health Screening  (ICD-V70.0) 3)  Rheumatoid Arthritis  (ICD-714.0) 4)  Gout  (ICD-274.9) 5)  Renal Insufficiency  (ICD-588.9) 6)  Coronary Artery Disease  (ICD-414.00) 7)  Myocardial Infarction, Hx of  (ICD-412) 8)  Congestive Heart Failure  (ICD-428.0) 9)  Diabetes Mellitus, Type II, Controlled, W/renal Comps  (ICD-250.40) 10)  Hyperlipidemia  (ICD-272.4) 11)  Hypertension  (ICD-401.9)  Current Medications (verified): 1)  Lopressor 50 Mg Tabs (Metoprolol Tartrate) .... Take 1 Tablet By Mouth Two Times A Day 2)  Glucovance 2.5-500 Mg  Tabs (Glyburide-Metformin) .... Take 1 Tablet By Mouth Two Times A Day 3)  Lantus 100 Unit/ml Soln (Insulin Glargine) .Marland Kitchen.. 11 Units Subcutaneos Every Morning 4)  Aspir-Low 81 Mg Tbec (Aspirin) .... Take 1 Tablet By Mouth Once A Day 5)  Amlodipine Besylate 10 Mg  Tabs (Amlodipine Besylate) .... Once Daily 6)  Tylenol 8 Hour 650 Mg  Tbcr (Acetaminophen) .... As Needed 7)  Onetouch Test   Strp (Glucose Blood) .... Use As Directed Once  or Twice Daily 8)  Simvastatin 40 Mg Tabs (Simvastatin) .... Take One Tablet At Bedtime 9)  Voltaren 1 % Gel (Diclofenac Sodium) .... Apply Two Times A Day To Wrist Two Times A Day As Needed 10)  Hydrochlorothiazide 25 Mg Tabs (Hydrochlorothiazide) .... Take One Tablet By Mouth Daily. 11)  Folic Acid 1 Mg Tabs (Folic Acid) .... Once Daily 12)  Hydrocortisone-Acetic Acid 1-2 % Soln (Hydrocortisone-Acetic Acid) .... As Needed  Allergies (verified): No Known Drug Allergies  Past History:  Past Medical History: Congestive heart failure Diabetes mellitus, type II Hyperlipidemia Hypertension Myocardial infarction, hx of dm nephropathy--microalbuminuria  2006 renal insufficiency Coronary artery disease Renal insufficiency 1. Coronary artery disease status post multiple percutaneous     interventions.  The last of which was percutaneous transluminal     coronary angioplasty of the mid-right coronary artery, now stable. 2. Good left ventricular function. 3.  Hypertension. 4. Diabetes. 5. Hyperlipidemia. 6. Pulmonary nodule by a CT scan. 7. Chronic renal insufficiency with creatinine in the range of 1.7.  Rheumatoid arthritis  Physical Exam  General:  well-developed well-nourished male in no acute distress. HEENT exam atraumatic, normocephalic symmetric her muscles are intact. Neck is supple. Chest clear auscultation cardiac exam S1-S2 are regular with a 2/6 systolic ejection murmur at the left upper sternal border. Abdominal exam overweight, to bowel sounds,  soft and nontender. Extremities with no clubbing cyanosis or edema. Muscles feel exam. He does have some effusions and tenosynovitis of the PIP, DIP and MCP joints bilaterally.   Impression & Recommendations:  Problem # 1:  ANEMIA, OTHER UNSPEC (ICD-285.9) resolved. I suspect previously related to rheumatoid arthritis. His updated medication list for this problem includes:    Folic Acid 1 Mg Tabs (Folic acid) ..... Once daily  Hgb: 13.6 (06/15/2010)   Hct: 41.3 (06/15/2010)   Platelets: 137.0 (06/15/2010) RBC: 4.45 (06/15/2010)   RDW: 15.2 (06/15/2010)   WBC: 7.0 (06/15/2010) MCV: 92.9 (06/15/2010)   MCHC: 32.9 (06/15/2010) Ferritin: 309.6 (06/15/2010) Iron: 85 (06/15/2010)   % Sat: 24.6 (06/15/2010) B12: 395 (06/15/2010)   Folate: 11.9 (06/15/2010)   TSH: 1.43 (06/15/2010)  Problem # 2:  RHEUMATOID ARTHRITIS (ICD-714.0) I think this is the most likely explanation of his joint pain in his hands. Will start low-dose prednisone and methotrexate. We'll taper prednisone relatively quickly given his history of diabetes.  Side effects of both medications discussed. He will avoid alcohol while on methotrexate.  (note---MTX originally sent in error. Pharmacy notified not to fill that prescription. Prescription sent to same pharmacy)  Problem # 3:  DIABETES MELLITUS, TYPE II, CONTROLLED, W/RENAL COMPS (ICD-250.40) will continue current medications. Patient prefers Lantus 10. I'll provide that. His updated medication list for this problem includes:    Glucovance 2.5-500 Mg Tabs (Glyburide-metformin) .Marland Kitchen... Take 1 tablet by mouth two times a day    Lantus Solostar 100 Unit/ml Soln (Insulin glargine) ..... Use as directed    Aspir-low 81 Mg Tbec (Aspirin) .Marland Kitchen... Take 1 tablet by mouth once a day  Labs Reviewed: Creat: 1.7 (09/06/2010)     Last Eye Exam: normal-pt's report (04/08/2010) Reviewed HgBA1c results: 7.7 (09/06/2010)  8.3 (05/03/2010)  Problem # 4:  GOUT (ICD-274.9) pinkies any  acute gout flare. His symptoms were consistent with rheumatoid arthritis.  Problem # 5:  HYPERLIPIDEMIA (ICD-272.4)  His updated medication list for this problem includes:    Simvastatin 40 Mg Tabs (Simvastatin) .Marland Kitchen... Take one tablet at bedtime  Labs Reviewed: SGOT: 40 (09/06/2010)   SGPT: 19 (09/06/2010)  Lipid Goals: Chol Goal: 200 (05/22/2007)   HDL Goal: 40 (05/22/2007)   LDL Goal: 70 (05/22/2007)   TG Goal: 150 (05/22/2007)  Prior 10 Yr Risk Heart Disease: N/A (05/22/2007)   HDL:32.70 (09/06/2010), 46.10 (05/03/2010)  LDL:56 (09/06/2010), 56 (06/16/2009)  Chol:125 (09/06/2010), 162 (05/03/2010)  Trig:180.0 (09/06/2010), 215.0 (05/03/2010)  Problem # 6:  RENAL INSUFFICIENCY (ICD-588.9)  Complete Medication List: 1)  Lopressor 50 Mg Tabs (Metoprolol tartrate) .... Take 1 tablet by mouth two times a day 2)  Glucovance 2.5-500 Mg Tabs (Glyburide-metformin) .... Take 1 tablet by mouth two times a day 3)  Lantus Solostar 100 Unit/ml Soln (Insulin glargine) .... Use as directed 4)  Aspir-low 81 Mg Tbec (Aspirin) .... Take 1 tablet by mouth once a day 5)  Amlodipine Besylate 10 Mg Tabs (Amlodipine besylate) .... Once daily 6)  Tylenol 8 Hour 650 Mg Tbcr (Acetaminophen) .... As  needed 7)  Onetouch Test Strp (Glucose blood) .... Use as directed once  or twice daily 8)  Simvastatin 40 Mg Tabs (Simvastatin) .... Take one tablet at bedtime 9)  Voltaren 1 % Gel (Diclofenac sodium) .... Apply two times a day to wrist two times a day as needed 10)  Hydrochlorothiazide 25 Mg Tabs (Hydrochlorothiazide) .... Take one tablet by mouth daily. 11)  Folic Acid 1 Mg Tabs (Folic acid) .... Once daily 12)  Hydrocortisone-acetic Acid 1-2 % Soln (Hydrocortisone-acetic acid) .... As needed 13)  Methotrexate 2.5 Mg Tabs (Methotrexate sodium) .... 5 by mouth once weekly 14)  Prednisone 10 Mg Tabs (Prednisone) .... 2 by mouth once daily for 5 days and then 1 by mouth once daily for 5 days and then 1/2 by mouth  once daily for 5 days  Patient Instructions: 1)  Please schedule a follow-up appointment in 1 month. Prescriptions: PREDNISONE 10 MG  TABS (PREDNISONE) 2 by mouth once daily for 5 days and then 1 by mouth once daily for 5 days and then 1/2 by mouth once daily for 5 days  #20 x 0   Entered and Authorized by:   Birdie Sons MD   Signed by:   Birdie Sons MD on 09/13/2010   Method used:   Electronically to        [facility]  [address] [phone]* (retail)       [address]       [place], [place]  [address]       Ph: [phone]       Fax: [phone]   RxID:   [id number] METHOTREXATE 2.5 MG TABS (METHOTREXATE SODIUM) 5 by mouth once weekly  #20 x 6   Entered and Authorized by:   Birdie Sons MD   Signed by:   Birdie Sons MD on 09/13/2010   Method used:   [facility]ronically to        [facility]  [address] [phone]* (retail)       [address]       [place], [place]  [address]       Ph: [phone]       Fax: [phone]   RxID:   [id number] LANTUS SOLOSTAR 100 UNIT/ML  SOLN (INSULIN GLARGINE) use as directed  #3 boxes x 3   Entered and Authorized by:   Birdie Sons MD   Signed by:   Birdie Sons MD on 09/13/2010   Method used:   [facility]        [facility]  [address] [phone]* (retail)       [address]       [place], [place]  [address]       Ph: [phone]       Fax: [phone]   RxID:   [id number]    Orders Added: 1)  Est. Patient Level V [43154]

## 2010-11-08 NOTE — Medication Information (Signed)
Summary: Letter of Appeal for [employer]  Letter of Appeal for [employer]   Imported By: Maryln Gottron 12/20/2009 12:54:24  _____________________________________________________________________  External Attachment:    Type:   Image     Comment:   External Document

## 2010-11-08 NOTE — Progress Notes (Signed)
Summary: medical record request  Phone Note Other Incoming   Caller: [facility] Reason for Call: Confirm/change Appt, Get patient information Request: Send information Summary of Call: just rec'd fax that reqiured Dx code - but they also needed copy of last office note fax too. Please fax   ph [phone] fax [phone] Initial call taken by: Duard Brady LPN,  April 13, 1190 10:53 AM  Follow-up for Phone Call        I have attempt to call - they did not give 'area code' , and I do not see where this has made it into scan yet.  Hopefully they will call and request info again. KIK Follow-up by: Duard Brady LPN,  April 15, 4781 3:59 PM

## 2010-11-08 NOTE — Assessment & Plan Note (Signed)
Summary: cpx/pt coming in fasting/cjr   Vital Signs:  Patient profile:   77 year old male Height:      68 inches Weight:      228 pounds BMI:     34.79 Temp:     97.9 degrees F oral BP sitting:   150 / 80  (left arm) Cuff size:   large  Vitals Entered By: Kathrynn Speed CMA (May 03, 2010 9:58 AM) CC: cpx, fasting, src   Primary Care Provider:  Birdie Sons MD  CC:  cpx, fasting, and src.  History of Present Illness: cpx   Current Problems (verified): 1)  Rheumatoid Arthritis  (ICD-714.0) 2)  Gout  (ICD-274.9) 3)  Renal Insufficiency  (ICD-588.9) 4)  Coronary Artery Disease  (ICD-414.00) 5)  Myocardial Infarction, Hx of  (ICD-412) 6)  Congestive Heart Failure  (ICD-428.0) 7)  Diabetes Mellitus, Type II, Controlled, W/renal Comps  (ICD-250.40) 8)  Hyperlipidemia  (ICD-272.4) 9)  Hypertension  (ICD-401.9)  Current Medications (verified): 1)  Lopressor 50 Mg Tabs (Metoprolol Tartrate) .... Take 1 Tablet By Mouth Two Times A Day 2)  Glucovance 2.5-500 Mg Tabs (Glyburide-Metformin) .... Take 1 Tablet By Mouth Two Times A Day 3)  Lantus 100 Unit/ml Soln (Insulin Glargine) .Marland Kitchen.. 11 Units Subcutaneos Every Morning 4)  Aspir-Low 81 Mg Tbec (Aspirin) .... Take 1 Tablet By Mouth Once A Day 5)  Amlodipine Besylate 10 Mg  Tabs (Amlodipine Besylate) .... Once Daily 6)  Tylenol 8 Hour 650 Mg  Tbcr (Acetaminophen) .... As Needed 7)  Onetouch Test   Strp (Glucose Blood) .... Use As Directed Once  or Twice Daily 8)  Simvastatin 40 Mg Tabs (Simvastatin) .... Take One Tablet At Bedtime 9)  Voltaren 1 % Gel (Diclofenac Sodium) .... Apply Two Times A Day To Wrist Two Times A Day As Needed 10)  Prednisone 10 Mg  Tabs (Prednisone) .... 1/2 Once Daily 11)  Hydrochlorothiazide 25 Mg Tabs (Hydrochlorothiazide) .... Take One Tablet By Mouth Daily. 12)  Uloric 40 Mg Tabs (Febuxostat) .... Take 1 Tablet By Mouth Once A Day 13)  Folic Acid 1 Mg Tabs (Folic Acid) .... Once Daily 14)  Rheumatrex 2.5  Mg Tabs (Methotrexate (Anti-Rheumatic)) .... Five Tablets A Day As Needed  Allergies (verified): No Known Drug Allergies  Past History:  Past Medical History: Last updated: 09/09/2009 Congestive heart failure Diabetes mellitus, type II Hyperlipidemia Hypertension Myocardial infarction, hx of dm nephropathy--microalbuminuria  2006 renal insufficiency Coronary artery disease Renal insufficiency 1. Coronary artery disease status post multiple percutaneous     interventions.  The last of which was percutaneous transluminal     coronary angioplasty of the mid-right coronary artery, now stable. 2. Good left ventricular function. 3. Hypertension. 4. Diabetes. 5. Hyperlipidemia. 6. Pulmonary nodule by a CT scan. 7. Chronic renal insufficiency with creatinine in the range of 1.7.   Past Surgical History: Last updated: 03/18/2007 ventral hernia repair angioplasty 1997 stent placed 2002 (coronary) Inguinal herniorrhaphy stent RCA ( angioplasty)--2008  Family History: Last updated: 2006-07-17 father deceased stroke age 58 Mother deceased stroke 41  Social History: Last updated: 07-17-2006 Retired Married Former Smoker Alcohol use-no none in 4 months since taking meds for back pain (2007)  Risk Factors: Exercise: no (05/22/2007)  Risk Factors: Smoking Status: quit > 6 months (07/19/2009) Cigars/wk: 2-3 per week (07/19/2009)  Physical Exam  General:  alert and well-developed.   Head:  normocephalic and atraumatic.   Eyes:  pupils equal and pupils round.   Ears:  R ear normal and L ear normal.   Neck:  No deformities, masses, or tenderness noted. Chest Wall:  No deformities, masses, tenderness or gynecomastia noted. Lungs:  normal respiratory effort.   Heart:  normal rate and regular rhythm.   Abdomen:  soft and non-tender.  overweight Msk:  FROM both wrists, no effusion or tenosynovitis Pulses:  R radial normal and L radial normal.   Neurologic:  cranial nerves  II-XII intact and gait normal.    Diabetes Management Exam:    Eye Exam:       Eye Exam done elsewhere          Date: 04/08/2010          Results: normal-pt's report          Done by: ophtha   Impression & Recommendations:  Problem # 1:  HEALTH SCREENING (ICD-V70.0) health maint utd advised regulr exercise weight loss encouraged Orders: Venipuncture ([address]) TLB-Lipid Panel (80061-LIPID) TLB-CBC Platelet - w/Differential (85025-CBCD) TLB-BMP (Basic Metabolic Panel-BMET) (80048-METABOL) TLB-Hepatic/Liver Function Pnl (80076-HEPATIC) TLB-TSH (Thyroid Stimulating Hormone) (84443-TSH) TLB-PSA (Prostate Specific Antigen) (84153-PSA) UA Dipstick w/o Micro (automated)  (81003)  Problem # 2:  RHEUMATOID ARTHRITIS (ICD-714.0) followed by dr Kellie Simmering  Problem # 3:  GOUT (ICD-274.9) no recurrence  Problem # 4:  CORONARY ARTERY DISEASE (ICD-414.00)  no sxs continue current medications  His updated medication list for this problem includes:    Lopressor 50 Mg Tabs (Metoprolol tartrate) .Marland Kitchen... Take 1 tablet by mouth two times a day    Aspir-low 81 Mg Tbec (Aspirin) .Marland Kitchen... Take 1 tablet by mouth once a day    Amlodipine Besylate 10 Mg Tabs (Amlodipine besylate) ..... Once daily    Hydrochlorothiazide 25 Mg Tabs (Hydrochlorothiazide) .Marland Kitchen... Take one tablet by mouth daily.  Labs Reviewed: Chol: 112 (06/16/2009)   HDL: 34.50 (06/16/2009)   LDL: 56 (06/16/2009)   TG: 106.0 (06/16/2009)  Lipid Goals: Chol Goal: 200 (05/22/2007)   HDL Goal: 40 (05/22/2007)   LDL Goal: 70 (05/22/2007)   TG Goal: 150 (05/22/2007)  Complete Medication List: 1)  Lopressor 50 Mg Tabs (Metoprolol tartrate) .... Take 1 tablet by mouth two times a day 2)  Glucovance 2.5-500 Mg Tabs (Glyburide-metformin) .... Take 1 tablet by mouth two times a day 3)  Lantus 100 Unit/ml Soln (Insulin glargine) .Marland Kitchen.. 11 units subcutaneos every morning 4)  Aspir-low 81 Mg Tbec (Aspirin) .... Take 1 tablet by mouth once a day 5)   Amlodipine Besylate 10 Mg Tabs (Amlodipine besylate) .... Once daily 6)  Tylenol 8 Hour 650 Mg Tbcr (Acetaminophen) .... As needed 7)  Onetouch Test Strp (Glucose blood) .... Use as directed once  or twice daily 8)  Simvastatin 40 Mg Tabs (Simvastatin) .... Take one tablet at bedtime 9)  Voltaren 1 % Gel (Diclofenac sodium) .... Apply two times a day to wrist two times a day as needed 10)  Prednisone 10 Mg Tabs (Prednisone) .... 1/2 once daily 11)  Hydrochlorothiazide 25 Mg Tabs (Hydrochlorothiazide) .... Take one tablet by mouth daily. 12)  Folic Acid 1 Mg Tabs (Folic acid) .... Once daily 13)  Rheumatrex 2.5 Mg Tabs (Methotrexate (anti-rheumatic)) .... Five tablets a day as needed  Other Orders: TLB-A1C / Hgb A1C (Glycohemoglobin) (83036-A1C)  Appended Document: Orders Update     Clinical Lists Changes  Orders: Added new Service order of Specimen Handling (60454) - Signed Observations: Added new observation of COMMENTS: Rita Ohara  May 03, 2010 11:58 AM  (05/03/2010 10:34) Added new observation  of PH URINE: 6.5  (05/03/2010 10:34) Added new observation of SPEC GR URIN: 1.020  (05/03/2010 10:34) Added new observation of APPEARANCE U: Clear  (05/03/2010 10:34) Added new observation of UA COLOR: yellow  (05/03/2010 10:34) Added new observation of WBC DIPSTK U: negative  (05/03/2010 10:34) Added new observation of NITRITE URN: negative  (05/03/2010 10:34) Added new observation of UROBILINOGEN: negative  (05/03/2010 10:34) Added new observation of PROTEIN, URN: 2+  (05/03/2010 10:34) Added new observation of BLOOD UR DIP: trace-lysed  (05/03/2010 10:34) Added new observation of KETONES URN: negative  (05/03/2010 10:34) Added new observation of BILIRUBIN UR: negative  (05/03/2010 10:34) Added new observation of GLUCOSE, URN: negative  (05/03/2010 10:34)      Laboratory Results   Urine Tests    Routine Urinalysis   Color: yellow Appearance: Clear Glucose: negative    (Normal Range: Negative) Bilirubin: negative   (Normal Range: Negative) Ketone: negative   (Normal Range: Negative) Spec. Gravity: 1.020   (Normal Range: 1.003-1.035) Blood: trace-lysed   (Normal Range: Negative) pH: 6.5   (Normal Range: 5.0-8.0) Protein: 2+   (Normal Range: Negative) Urobilinogen: negative   (Normal Range: 0-1) Nitrite: negative   (Normal Range: Negative) Leukocyte Esterace: negative   (Normal Range: Negative)    Comments: Rita Ohara  May 03, 2010 11:58 AM     Laboratory Results   Urine Tests    Routine Urinalysis   Color: yellow Appearance: Clear Glucose: negative   (Normal Range: Negative) Bilirubin: negative   (Normal Range: Negative) Ketone: negative   (Normal Range: Negative) Spec. Gravity: 1.020   (Normal Range: 1.003-1.035) Blood: trace-lysed   (Normal Range: Negative) pH: 6.5   (Normal Range: 5.0-8.0) Protein: 2+   (Normal Range: Negative) Urobilinogen: negative   (Normal Range: 0-1) Nitrite: negative   (Normal Range: Negative) Leukocyte Esterace: negative   (Normal Range: Negative)    Comments: Rita Ohara  May 03, 2010 11:58 AM

## 2010-11-08 NOTE — Progress Notes (Signed)
Summary: FYI--Declined appt  Phone Note From Other Clinic   Summary of Call: FYI-- GI called patient on 05-27-10 to schedule and patient declined. Initial call taken by: Lucious Groves CMA,  June 15, 2010 1:11 PM

## 2010-11-08 NOTE — Progress Notes (Signed)
Summary: Pt called and said scripts went to wrong pharmacy  Phone Note Call from Patient Call back at Home Phone [phone]   Caller: Patient Summary of Call: Pt called and said that all [facility] meds needed [facility] [address]        Initial call taken by: Lucy Antigua,  September 14, 2010 8:22 AM  Follow-up for Phone Call        [facility]. Left PT detail message that Rx have been resent to corrected pharmacy...........Marland KitchenFelecia Deloach CMA  September 14, 2010 11:39 AM     Prescriptions: PREDNISONE 10 MG  TABS (PREDNISONE) 2 by mouth once daily for 5 days and then 1 by mouth once daily for 5 days and then 1/2 by mouth once daily for 5 days  #20 x 0   Entered by:   Jeremy Johann CMA   Authorized by:   Alfred Levins, CMA   Signed by:   Jeremy Johann CMA on 09/14/2010   Method used:   Re-Faxed to ...       [facility] [address] [phone]* (retail)       [address]       [place], [place]  [address]       Ph: [phone]       Fax: [phone]   RxID:   [id number] METHOTREXATE 2.5 MG TABS (METHOTREXATE SODIUM) 5 by mouth once weekly  #20 x 6   Entered by:   Jeremy Johann CMA   Authorized by:   Alfred Levins, CMA   Signed by:   Jeremy Johann CMA on 09/14/2010   Method used:   Re-Faxed to ...       [facility] [address] [phone]* (retail)       [address]       [place], [place]  [address]       Ph: [phone]       Fax: [phone]   RxID:   [id number] LANTUS SOLOSTAR 100 UNIT/ML  SOLN (INSULIN GLARGINE) use as directed  #3 boxes x 3   Entered by:   Jeremy Johann CMA   Authorized by:   Alfred Levins, CMA   Signed by:   Jeremy Johann CMA on 09/14/2010   Method used:   Re-Faxed to ...       [facility] [address] [phone]* (retail)       [address]       [place], [place]  [address]       Ph: [phone]       Fax: [phone]   RxID:   [id number]

## 2010-11-08 NOTE — Assessment & Plan Note (Signed)
Summary: follow up labs - rv   Vital Signs:  Patient profile:   77 year old male Height:      68 inches (172.72 cm) Weight:      229.25 pounds (104.20 kg) BMI:     34.98 Temp:     98.3 degrees F (36.83 degrees C) oral BP sitting:   120 / 74  (left arm) Cuff size:   large  Vitals Entered By: Lucious Groves CMA (June 15, 2010 8:27 AM) CC: F/U on labs./kb Is Patient Diabetic? Yes Pain Assessment Patient in pain? no      Comments Patient states that he is not taking kRhematrex. Lucious Groves CMA  June 15, 2010 8:28 AM    Primary Care Provider:  Birdie Sons MD  CC:  F/U on labs./kb.  History of Present Illness:  Follow-Up Visit      This is a 77 year old man who presents for Follow-up visit.  The patient denies chest pain and palpitations.  Since the last visit the patient notes no new problems or concerns.  The patient reports taking meds as prescribed.  When questioned about possible medication side effects, the patient notes none.  minimal joint stiffness  All other systems reviewed and were negative   Current Medications (verified): 1)  Lopressor 50 Mg Tabs (Metoprolol Tartrate) .... Take 1 Tablet By Mouth Two Times A Day 2)  Glucovance 2.5-500 Mg Tabs (Glyburide-Metformin) .... Take 1 Tablet By Mouth Two Times A Day 3)  Lantus 100 Unit/ml Soln (Insulin Glargine) .Marland Kitchen.. 11 Units Subcutaneos Every Morning 4)  Aspir-Low 81 Mg Tbec (Aspirin) .... Take 1 Tablet By Mouth Once A Day 5)  Amlodipine Besylate 10 Mg  Tabs (Amlodipine Besylate) .... Once Daily 6)  Tylenol 8 Hour 650 Mg  Tbcr (Acetaminophen) .... As Needed 7)  Onetouch Test   Strp (Glucose Blood) .... Use As Directed Once  or Twice Daily 8)  Simvastatin 40 Mg Tabs (Simvastatin) .... Take One Tablet At Bedtime 9)  Voltaren 1 % Gel (Diclofenac Sodium) .... Apply Two Times A Day To Wrist Two Times A Day As Needed 10)  Prednisone 10 Mg  Tabs (Prednisone) .... 1/2 Once Daily 11)  Hydrochlorothiazide 25 Mg Tabs  (Hydrochlorothiazide) .... Take One Tablet By Mouth Daily. 12)  Folic Acid 1 Mg Tabs (Folic Acid) .... Once Daily 13)  Rheumatrex 2.5 Mg Tabs (Methotrexate (Anti-Rheumatic)) .... Five Tablets A Day As Needed  Allergies (verified): No Known Drug Allergies  Physical Exam  General:  alert and well-developed.   Head:  normocephalic and atraumatic.   Eyes:  pupils equal and pupils round.   Ears:  R ear normal and L ear normal.   Neck:  No deformities, masses, or tenderness noted. Chest Wall:  No deformities, masses, tenderness or gynecomastia noted. Lungs:  normal respiratory effort.   Heart:  normal rate and regular rhythm.   Abdomen:  soft and non-tender.  overweight Msk:  No deformity or scoliosis noted of thoracic or lumbar spine.   Neurologic:  cranial nerves II-XII intact and gait normal.     Impression & Recommendations:  Problem # 1:  RENAL INSUFFICIENCY (ICD-588.9)  note on metformin----recheck bmet---may need to stop  Orders: TLB-Renal Function Panel (80069-RENAL)  Problem # 2:  CORONARY ARTERY DISEASE (ICD-414.00) no sxs continue current medications  His updated medication list for this problem includes:    Lopressor 50 Mg Tabs (Metoprolol tartrate) .Marland Kitchen... Take 1 tablet by mouth two times a day  Aspir-low 81 Mg Tbec (Aspirin) .Marland Kitchen... Take 1 tablet by mouth once a day    Amlodipine Besylate 10 Mg Tabs (Amlodipine besylate) ..... Once daily    Hydrochlorothiazide 25 Mg Tabs (Hydrochlorothiazide) .Marland Kitchen... Take one tablet by mouth daily.  Problem # 3:  ANEMIA, OTHER UNSPEC (ICD-285.9) Assessment: New  refer to GI suspect anemia due to renal insufficiency His updated medication list for this problem includes:    Folic Acid 1 Mg Tabs (Folic acid) ..... Once daily  Orders: Venipuncture (16109) TLB-CBC Platelet - w/Differential (85025-CBCD) TLB-B12 + Folate Pnl (60454_09811-B14/NWG) TLB-IBC Pnl (Iron/FE;Transferrin) (83550-IBC) TLB-Ferritin (82728-FER)  Problem # 4:   RHEUMATOID ARTHRITIS (ICD-714.0) no recurrent sxs has f/u with rheumatology.   Complete Medication List: 1)  Lopressor 50 Mg Tabs (Metoprolol tartrate) .... Take 1 tablet by mouth two times a day 2)  Glucovance 2.5-500 Mg Tabs (Glyburide-metformin) .... Take 1 tablet by mouth two times a day 3)  Lantus 100 Unit/ml Soln (Insulin glargine) .Marland Kitchen.. 11 units subcutaneos every morning 4)  Aspir-low 81 Mg Tbec (Aspirin) .... Take 1 tablet by mouth once a day 5)  Amlodipine Besylate 10 Mg Tabs (Amlodipine besylate) .... Once daily 6)  Tylenol 8 Hour 650 Mg Tbcr (Acetaminophen) .... As needed 7)  Onetouch Test Strp (Glucose blood) .... Use as directed once  or twice daily 8)  Simvastatin 40 Mg Tabs (Simvastatin) .... Take one tablet at bedtime 9)  Voltaren 1 % Gel (Diclofenac sodium) .... Apply two times a day to wrist two times a day as needed 10)  Prednisone 10 Mg Tabs (Prednisone) .... 1/2 once daily 11)  Hydrochlorothiazide 25 Mg Tabs (Hydrochlorothiazide) .... Take one tablet by mouth daily. 12)  Folic Acid 1 Mg Tabs (Folic acid) .... Once daily 13)  Rheumatrex 2.5 Mg Tabs (Methotrexate (anti-rheumatic)) .... Five tablets a day as needed  Other Orders: Gastroenterology Referral (GI) TLB-TSH (Thyroid Stimulating Hormone) [phone])  Patient Instructions: 1)  see me 6 weeks Prescriptions: LOPRESSOR 50 MG TABS (METOPROLOL TARTRATE) Take 1 tablet by mouth two times a day  #200 x 3   Entered and Authorized by:   Birdie Sons MD   Signed by:   Birdie Sons MD on 06/15/2010   Method used:   Electronically to        [facility]* (retail)       [address]       [place], [place]  [address]       Ph: [phone]       Fax: [phone]   RxID:   [id number] SIMVASTATIN 40 MG TABS (SIMVASTATIN) Take one tablet at bedtime  #90 x 3   Entered and Authorized by:   Birdie Sons MD   Signed by:   Birdie Sons MD on 06/15/2010   Method used:   Electronically to        [facility]* (retail)       [address]       [place], [place]  [address]       Ph: [phone]       Fax: [phone]   RxID:   [id number]   Appended Document: Orders Update     Clinical Lists Changes  Orders: Added new Service order of Specimen Handling (27062) - Signed

## 2010-11-10 NOTE — Progress Notes (Signed)
Summary: NEW REFILL  Phone Note Refill Request Call back at Home Phone [phone] Message from:  Patient  BD STERILE NEEDLES CALL INTO [facility] [address]  Initial call taken by: Heron Sabins,  September 20, 2010 12:46 PM  Follow-up for Phone Call        Rx called to pharmacy Follow-up by: Alfred Levins, CMA,  September 21, 2010 7:40 AM    New/Updated Medications: INSULIN SYRINGE 28G X 1/2" 0.5 ML MISC (INSULIN SYRINGE-NEEDLE U-100) use as directed Prescriptions: INSULIN SYRINGE 28G X 1/2" 0.5 ML MISC (INSULIN SYRINGE-NEEDLE U-100) use as directed  #100 x 3   Entered by:   Alfred Levins, CMA   Authorized by:   Birdie Sons MD   Signed by:   Alfred Levins, CMA on 09/21/2010   Method used:   Electronically to        [facility] [phone]* (retail)       [address]       [place], [place]  [address]       Ph: [phone]       Fax: [phone]   RxID:   [id number]

## 2010-11-10 NOTE — Assessment & Plan Note (Signed)
Summary: 1 month rov/njr/PT RESCD//CCM   Vital Signs:  Patient profile:   77 year old male Weight:      237 pounds Temp:     98.5 degrees F oral Pulse rate:   80 / minute Pulse rhythm:   regular BP sitting:   134 / 76  (left arm) Cuff size:   large  Vitals Entered By: Alfred Levins, CMA (October 25, 2010 11:38 AM) CC: f/u, bs 77 this am Is Patient Diabetic? Yes Did you bring your meter with you today? No   Primary Care Provider:  Birdie Sons MD  CC:  f/u and bs 77 this am.  History of Present Illness:  Follow-Up Visit      This is a 77 year old man who presents for Follow-up visit.  The patient denies chest pain and palpitations.  Since the last visit the patient notes no new problems or concerns.  The patient reports taking meds as prescribed, not monitoring BP, dietary noncompliance, and not exercising.  When questioned about possible medication side effects, the patient notes none.  patient has no new problems   RA sxs are much better  All other systems reviewed and were negative   Current Problems (verified): 1)  Anemia, Other Unspec  (ICD-285.9) 2)  Health Screening  (ICD-V70.0) 3)  Rheumatoid Arthritis  (ICD-714.0) 4)  Gout  (ICD-274.9) 5)  Renal Insufficiency  (ICD-588.9) 6)  Coronary Artery Disease  (ICD-414.00) 7)  Myocardial Infarction, Hx of  (ICD-412) 8)  Congestive Heart Failure  (ICD-428.0) 9)  Diabetes Mellitus, Type II, Controlled, W/renal Comps  (ICD-250.40) 10)  Hyperlipidemia  (ICD-272.4) 11)  Hypertension  (ICD-401.9)  Current Medications (verified): 1)  Lopressor 50 Mg Tabs (Metoprolol Tartrate) .... Take 1 Tablet By Mouth Two Times A Day 2)  Glucovance 2.5-500 Mg Tabs (Glyburide-Metformin) .... Take 1 Tablet By Mouth Two Times A Day 3)  Lantus Solostar 100 Unit/ml  Soln (Insulin Glargine) .... Use As Directed 4)  Aspir-Low 81 Mg Tbec (Aspirin) .... Take 1 Tablet By Mouth Once A Day 5)  Amlodipine Besylate 10 Mg  Tabs (Amlodipine Besylate) ....  Once Daily 6)  Tylenol 8 Hour 650 Mg  Tbcr (Acetaminophen) .... As Needed 7)  Onetouch Test   Strp (Glucose Blood) .... Use As Directed Once  or Twice Daily 8)  Simvastatin 40 Mg Tabs (Simvastatin) .... Take One Tablet At Bedtime 9)  Voltaren 1 % Gel (Diclofenac Sodium) .... Apply Two Times A Day To Wrist Two Times A Day As Needed 10)  Hydrochlorothiazide 25 Mg Tabs (Hydrochlorothiazide) .... Take One Tablet By Mouth Daily. 11)  Folic Acid 1 Mg Tabs (Folic Acid) .... Once Daily 12)  Hydrocortisone-Acetic Acid 1-2 % Soln (Hydrocortisone-Acetic Acid) .... As Needed 13)  Methotrexate 2.5 Mg Tabs (Methotrexate Sodium) .... 5 By Mouth Once Weekly 14)  Insulin Syringe 28g X 1/2" 0.5 Ml Misc (Insulin Syringe-Needle U-100) .... Use As Directed  Allergies (verified): No Known Drug Allergies  Past History:  Past Medical History: Last updated: 09/13/2010 Congestive heart failure Diabetes mellitus, type II Hyperlipidemia Hypertension Myocardial infarction, hx of dm nephropathy--microalbuminuria  2006 renal insufficiency Coronary artery disease Renal insufficiency 1. Coronary artery disease status post multiple percutaneous     interventions.  The last of which was percutaneous transluminal     coronary angioplasty of the mid-right coronary artery, now stable. 2. Good left ventricular function. 3. Hypertension. 4. Diabetes. 5. Hyperlipidemia. 6. Pulmonary nodule by a CT scan. 7. Chronic renal insufficiency with  creatinine in the range of 1.7.  Rheumatoid arthritis  Past Surgical History: Last updated: 03/18/2007 ventral hernia repair angioplasty 1997 stent placed 2002 (coronary) Inguinal herniorrhaphy stent RCA ( angioplasty)--2008  Family History: Last updated: 08/05/2006 father deceased stroke age 75 Mother deceased stroke 60  Social History: Last updated: 2006-08-05 Retired Married Former Smoker Alcohol use-no none in 4 months since taking meds for back pain  (2007)  Risk Factors: Exercise: no (05/22/2007)  Risk Factors: Smoking Status: quit > 6 months (07/19/2009) Cigars/wk: 2-3 per week (07/19/2009)  Physical Exam  General:  alert and well-developed.   Head:  normocephalic and atraumatic.   Eyes:  pupils equal and pupils round.   Neck:  No deformities, masses, or tenderness noted. Chest Wall:  No deformities, masses, tenderness or gynecomastia noted. Lungs:  normal respiratory effort.   Abdomen:  soft and non-tender.   Msk:  No deformity or scoliosis noted of thoracic or lumbar spine.   Neurologic:  cranial nerves II-XII intact and gait normal.     Impression & Recommendations:  Problem # 1:  RHEUMATOID ARTHRITIS (ICD-714.0) sxs much better continue current medications   Problem # 2:  RENAL INSUFFICIENCY (ICD-588.9) reviewed labs  Problem # 3:  CORONARY ARTERY DISEASE (ICD-414.00) no sxs His updated medication list for this problem includes:    Lopressor 50 Mg Tabs (Metoprolol tartrate) .Marland Kitchen... Take 1 tablet by mouth two times a day    Aspir-low 81 Mg Tbec (Aspirin) .Marland Kitchen... Take 1 tablet by mouth once a day    Amlodipine Besylate 10 Mg Tabs (Amlodipine besylate) ..... Once daily    Hydrochlorothiazide 25 Mg Tabs (Hydrochlorothiazide) .Marland Kitchen... Take one tablet by mouth daily.  Labs Reviewed: Chol: 125 (09/06/2010)   HDL: 32.70 (09/06/2010)   LDL: 56 (09/06/2010)   TG: 180.0 (09/06/2010)  Lipid Goals: Chol Goal: 200 (05/22/2007)   HDL Goal: 40 (05/22/2007)   LDL Goal: 70 (05/22/2007)   TG Goal: 150 (05/22/2007)  Problem # 4:  DIABETES MELLITUS, TYPE II, CONTROLLED, W/RENAL COMPS (ICD-250.40)  His updated medication list for this problem includes:    Glyburide 5 Mg Tabs (Glyburide) .Marland Kitchen... Take 1 tablet by mouth two times a day with meals    Lantus Solostar 100 Unit/ml Soln (Insulin glargine) ..... Use as directed    Aspir-low 81 Mg Tbec (Aspirin) .Marland Kitchen... Take 1 tablet by mouth once a day  Labs Reviewed: Creat: 1.7 (09/06/2010)      Last Eye Exam: normal-pt's report (04/08/2010) Reviewed HgBA1c results: 7.7 (09/06/2010)  8.3 (05/03/2010)  Problem # 5:  HYPERLIPIDEMIA (ICD-272.4) continue same His updated medication list for this problem includes:    Simvastatin 40 Mg Tabs (Simvastatin) .Marland Kitchen... Take one tablet at bedtime  Labs Reviewed: SGOT: 40 (09/06/2010)   SGPT: 19 (09/06/2010)  Lipid Goals: Chol Goal: 200 (05/22/2007)   HDL Goal: 40 (05/22/2007)   LDL Goal: 70 (05/22/2007)   TG Goal: 150 (05/22/2007)  Prior 10 Yr Risk Heart Disease: N/A (05/22/2007)   HDL:32.70 (09/06/2010), 46.10 (05/03/2010)  LDL:56 (09/06/2010), 56 (06/16/2009)  Chol:125 (09/06/2010), 162 (05/03/2010)  Trig:180.0 (09/06/2010), 215.0 (05/03/2010)  Complete Medication List: 1)  Lopressor 50 Mg Tabs (Metoprolol tartrate) .... Take 1 tablet by mouth two times a day 2)  Glyburide 5 Mg Tabs (Glyburide) .... Take 1 tablet by mouth two times a day with meals 3)  Lantus Solostar 100 Unit/ml Soln (Insulin glargine) .... Use as directed 4)  Aspir-low 81 Mg Tbec (Aspirin) .... Take 1 tablet by mouth once a day 5)  Amlodipine Besylate 10 Mg Tabs (Amlodipine besylate) .... Once daily 6)  Tylenol 8 Hour 650 Mg Tbcr (Acetaminophen) .... As needed 7)  Onetouch Test Strp (Glucose blood) .... Use as directed once  or twice daily 8)  Simvastatin 40 Mg Tabs (Simvastatin) .... Take one tablet at bedtime 9)  Voltaren 1 % Gel (Diclofenac sodium) .... Apply two times a day to wrist two times a day as needed 10)  Hydrochlorothiazide 25 Mg Tabs (Hydrochlorothiazide) .... Take one tablet by mouth daily. 11)  Folic Acid 1 Mg Tabs (Folic acid) .... Once daily 12)  Hydrocortisone-acetic Acid 1-2 % Soln (Hydrocortisone-acetic acid) .... As needed 13)  Methotrexate 2.5 Mg Tabs (Methotrexate sodium) .... 5 by mouth once weekly 14)  [employer] Pen Needles 31g X 6 Mm Misc (Insulin pen needle) .... Use once daily  Patient Instructions: 1)  Please schedule a follow-up  appointment in 2 months. 2)  labs one week prior to visit 3)  lipids---272.4 4)  lfts-995.2 5)  bmet-995.2 6)  A1C-250.02 7)     Prescriptions: GLYBURIDE 5 MG  TABS (GLYBURIDE) Take 1 tablet by mouth two times a day with meals  #180 x 3   Entered and Authorized by:   Birdie Sons MD   Signed by:   Birdie Sons MD on 10/30/2010   Method used:   Electronically to        [facility]* (retail)       [address]       [place], [place]  [address]       Ph: [phone]       Fax: [phone]   RxID:   [id number] ME[employer]ER PEN NEEDLES 31G X 6 MM MISC (INSULIN PEN NEEDLE) use once daily  #100 x 3   Entered and Authorized by:   Birdie Sons MD   Signed by:   Birdie Sons MD on 10/25/2010   Method used:   Print then Give to Patient   RxID:   [id number]

## 2010-12-12 IMAGING — CR DG HAND COMPLETE 3+V*L*
3 series · 3 of 3 positions shown · non-contrast
Comparison: None.

CLINICAL DATA: Pain and swelling.  No injury.

LEFT HAND - COMPLETE 3+ VIEW

[view not recorded (1 of 3)]
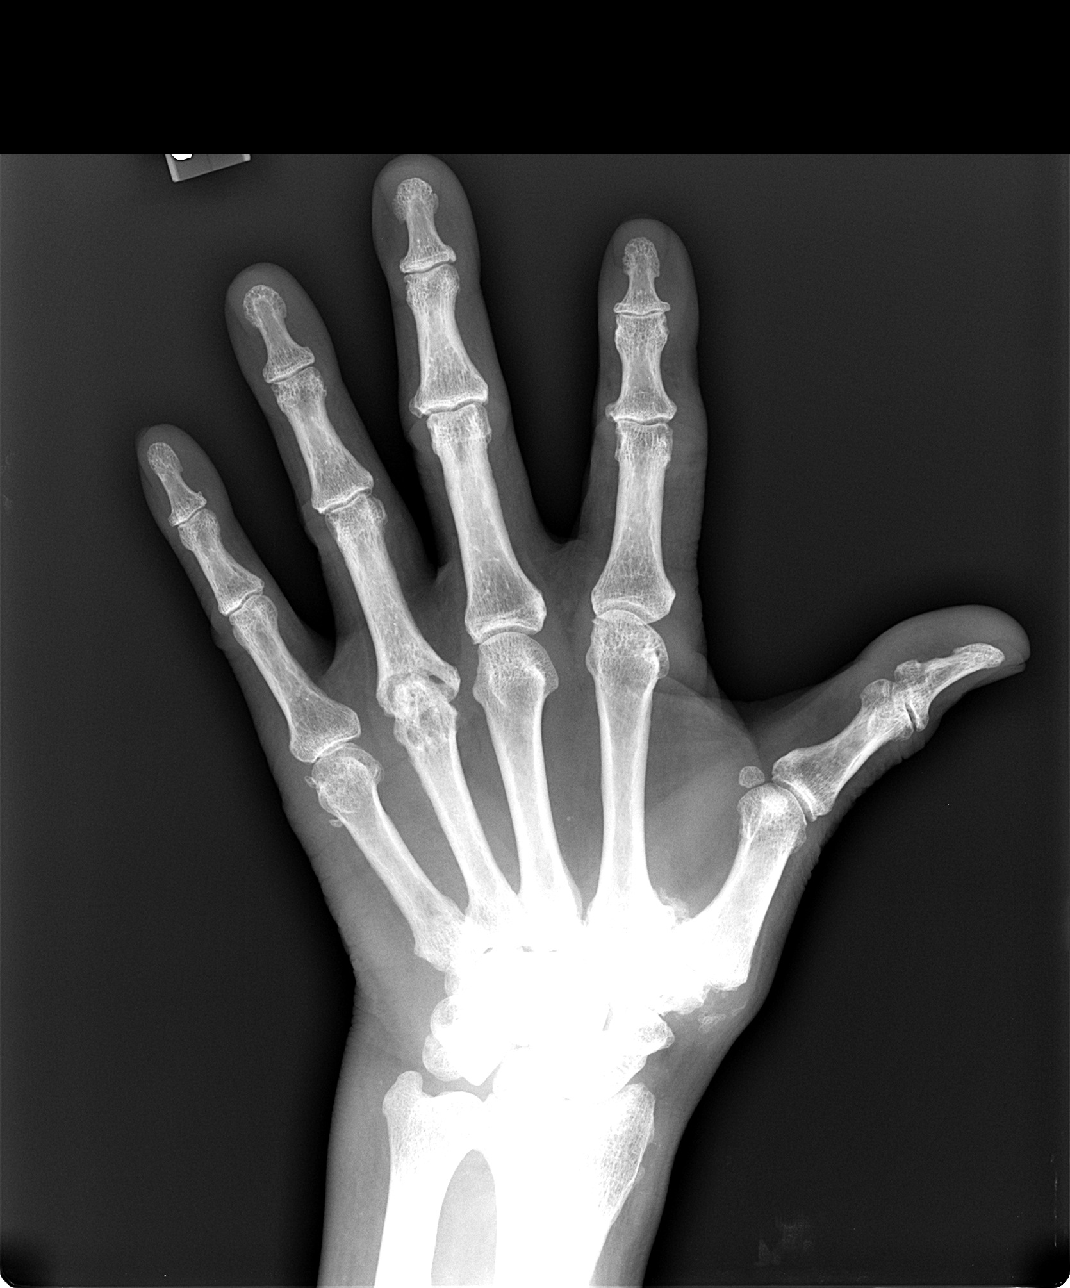

[view not recorded (2 of 3)]
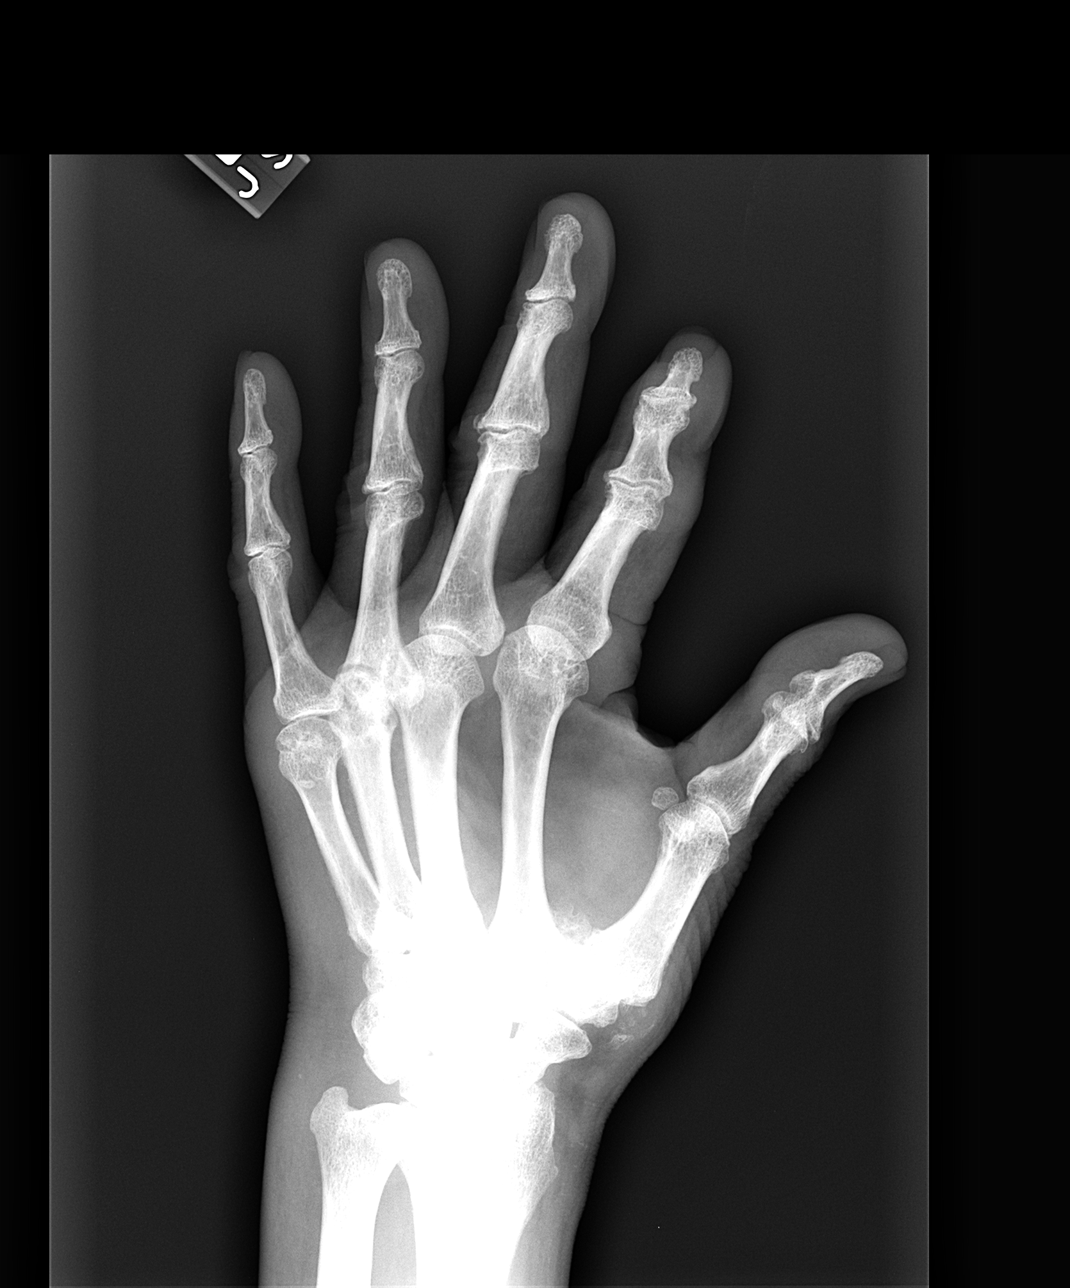

[view not recorded (3 of 3)]
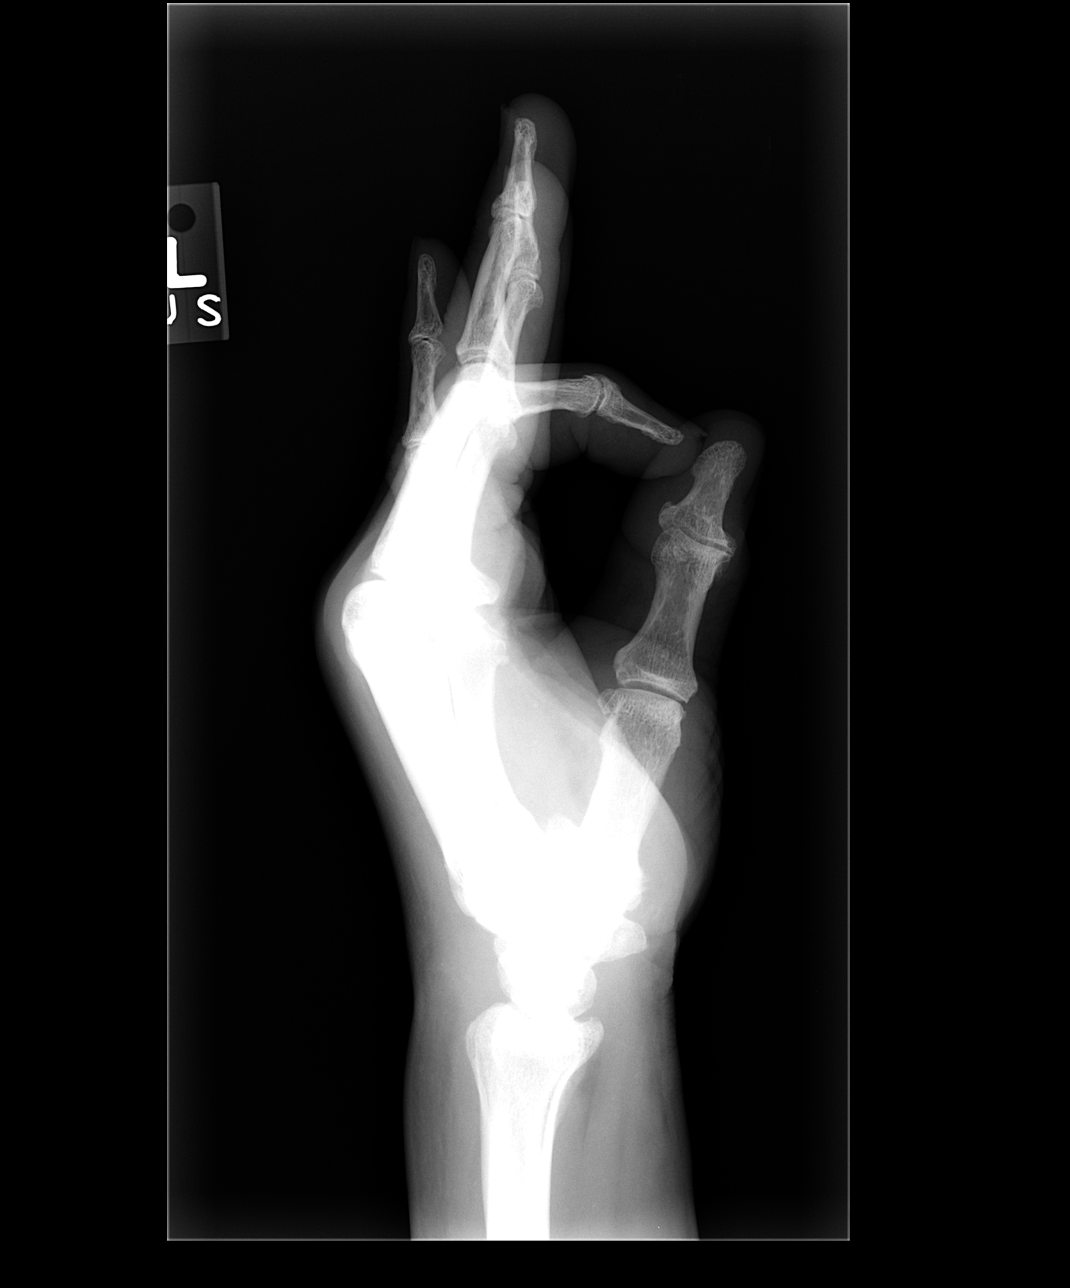

[3 of 3 positions shown; findings below may reference images not displayed]

FINDINGS: Moderate to advanced degenerative change in the first
carpal metacarpal joint with spurring and subchondral cyst
formation in the triquetrum.  There is advanced arthropathy of the
fourth metacarpal phalangeal joint which may be due to old trauma.
There is some subluxation and remodeling of the MCP joint.  Mild
degenerative change in the second DIP and PIP.  No erosions are
seen and  the mineralization is normal.
IMPRESSION: Moderate to advanced degenerative change in the first carpal
metacarpal joint.

Deformity of the fourth metacarpal phalangeal joint which may be
due to chronic osteoarthritis or post traumatic arthropathy.  Mild
degenerative changes elsewhere.  No erosions are identified.

## 2010-12-12 IMAGING — CR DG KNEE COMPLETE 4+V*L*
4 series · 4 of 4 positions shown · non-contrast
Comparison: None

CLINICAL DATA: Knee pain and swelling.

LEFT KNEE - COMPLETE 4+ VIEW

[view not recorded (1 of 4)]
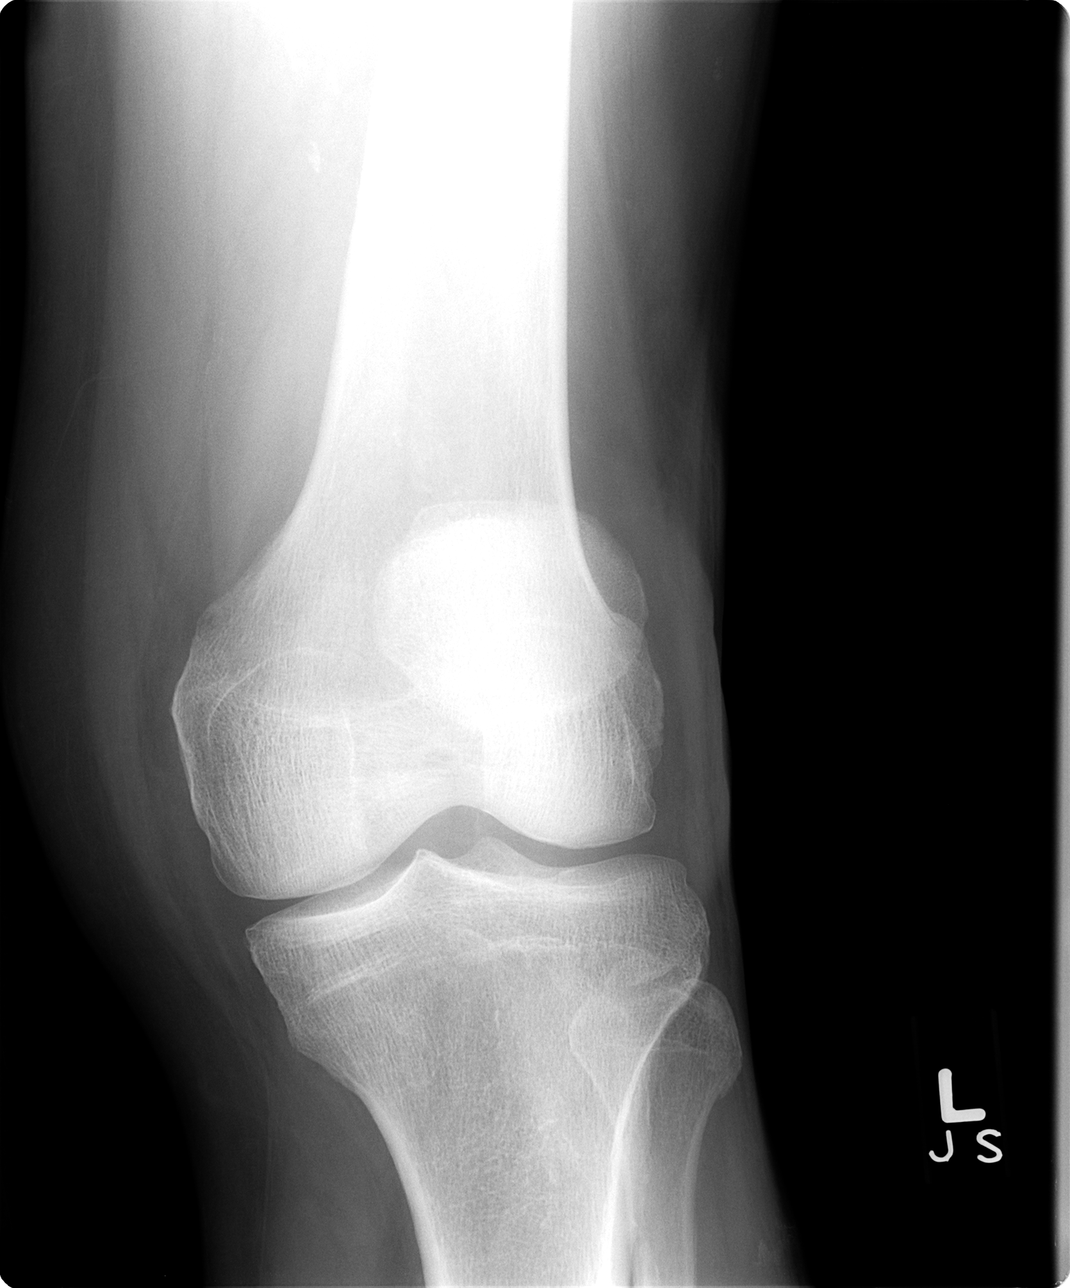

[view not recorded (2 of 4)]
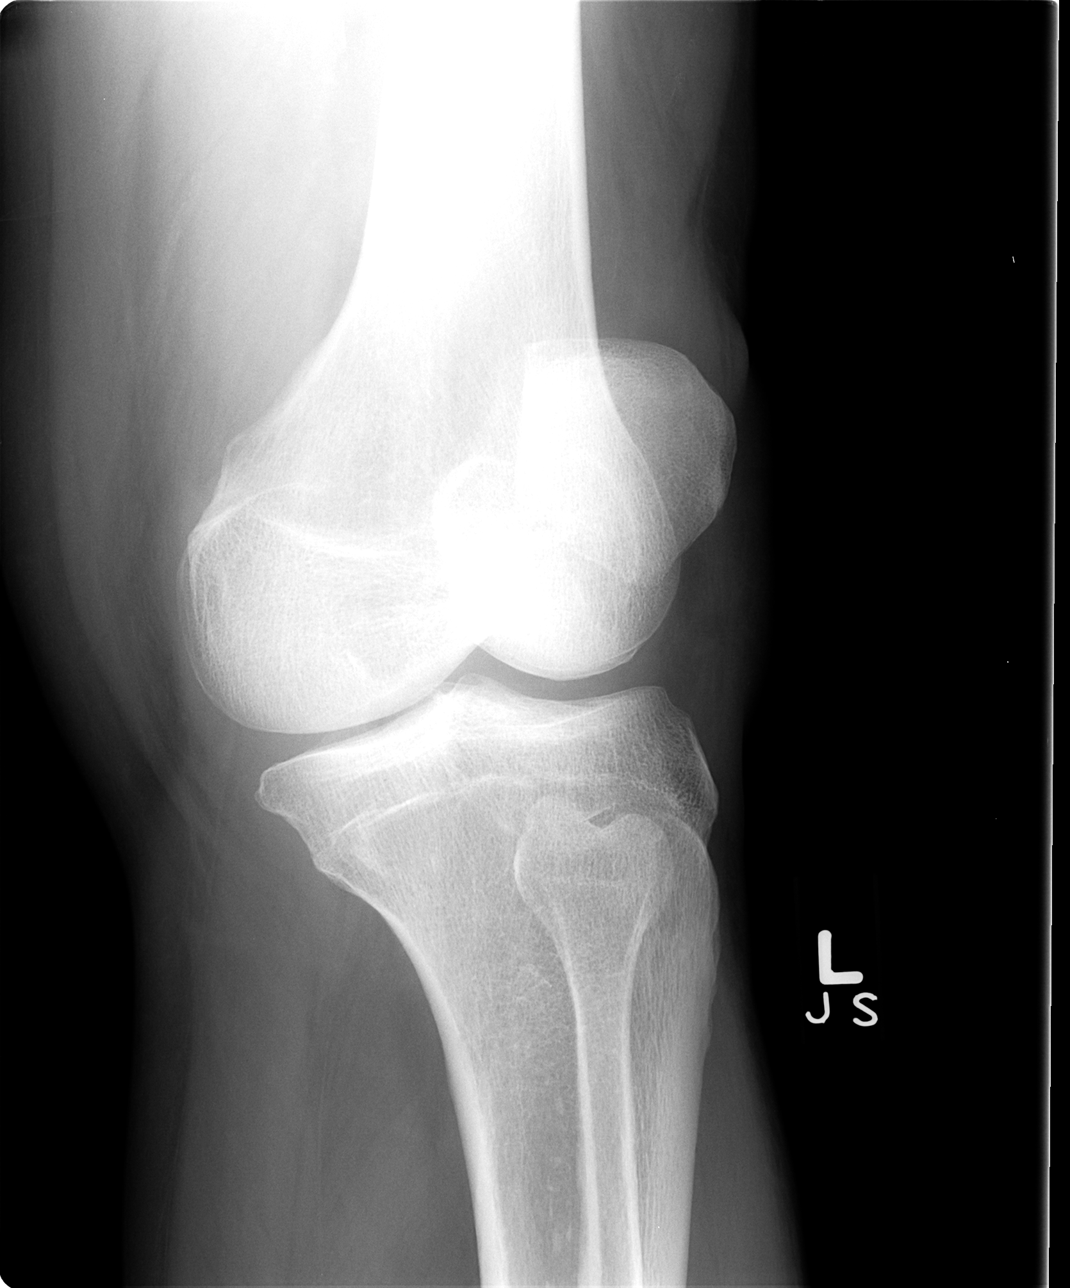

[view not recorded (3 of 4)]
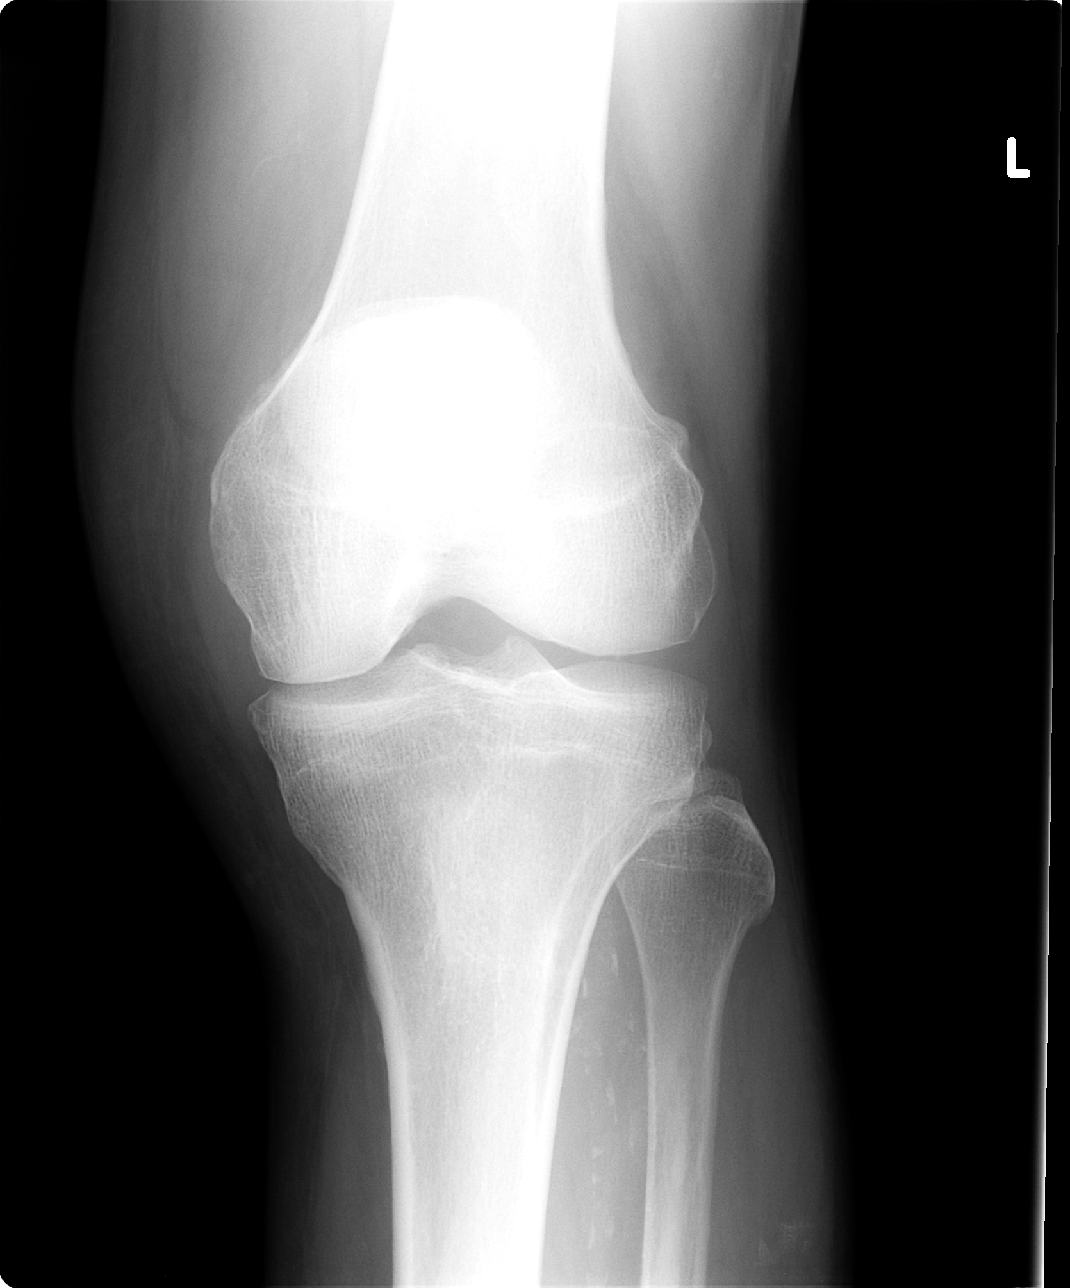

[view not recorded (4 of 4)]
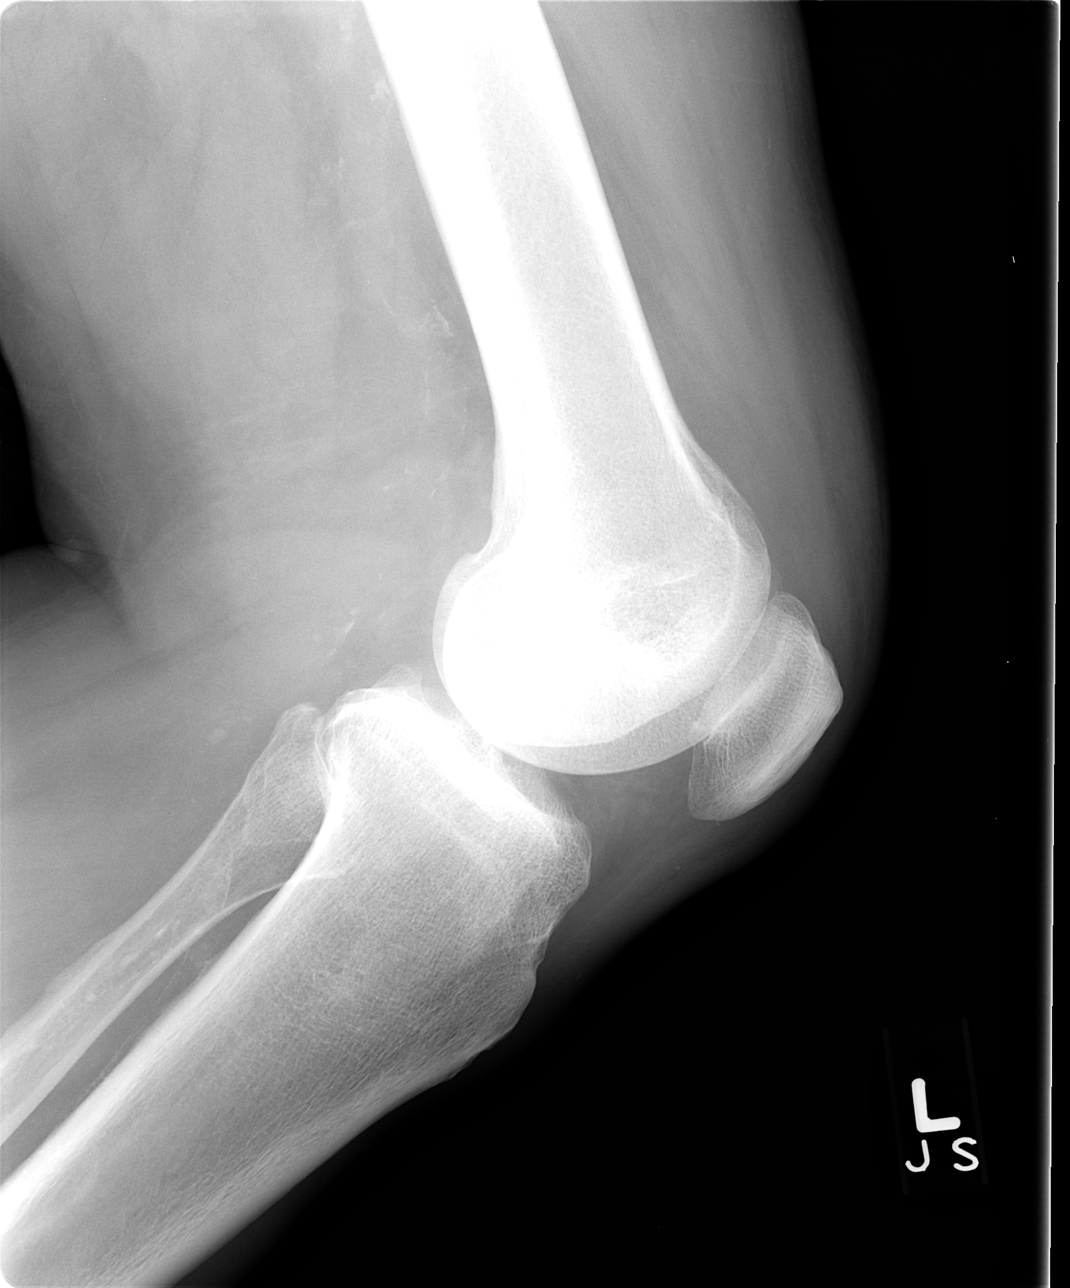

[4 of 4 positions shown; findings below may reference images not displayed]

FINDINGS: Minimal osteophytic lipping of the medial tibial plateau.
No joint space narrowing.  No para-articular erosions.  Negative
for joint effusion.
IMPRESSION: Minimal degenerative changes.

## 2010-12-20 ENCOUNTER — Other Ambulatory Visit (INDEPENDENT_AMBULATORY_CARE_PROVIDER_SITE_OTHER): Payer: Medicare Other

## 2010-12-20 DIAGNOSIS — T887XXA Unspecified adverse effect of drug or medicament, initial encounter: Secondary | ICD-10-CM

## 2010-12-20 DIAGNOSIS — E785 Hyperlipidemia, unspecified: Secondary | ICD-10-CM

## 2010-12-20 DIAGNOSIS — E119 Type 2 diabetes mellitus without complications: Secondary | ICD-10-CM

## 2010-12-20 LAB — LIPID PANEL
HDL: 32.8 mg/dL — ABNORMAL LOW (ref >39.00)
Triglycerides: 133 mg/dL (ref 0.0–149.0)

## 2010-12-20 LAB — BASIC METABOLIC PANEL
Calcium: 9.1 mg/dL (ref 8.4–10.5)
GFR: 40.33 mL/min — ABNORMAL LOW (ref >60.00)
Sodium: 140 mEq/L (ref 135–145)

## 2010-12-20 LAB — HEPATIC FUNCTION PANEL
ALT: 18 U/L (ref 0–53)
AST: 22 U/L (ref 0–37)
Albumin: 4.1 g/dL (ref 3.5–5.2)

## 2010-12-22 ENCOUNTER — Encounter: Payer: Self-pay | Admitting: Internal Medicine

## 2010-12-27 ENCOUNTER — Ambulatory Visit: Payer: Self-pay | Admitting: Internal Medicine

## 2010-12-29 ENCOUNTER — Ambulatory Visit (INDEPENDENT_AMBULATORY_CARE_PROVIDER_SITE_OTHER): Payer: Medicare Other | Admitting: Internal Medicine

## 2010-12-29 ENCOUNTER — Encounter: Payer: Self-pay | Admitting: Internal Medicine

## 2010-12-29 VITALS — BP 114/66 | HR 76 | Temp 98.2°F | Ht 73.0 in | Wt 241.0 lb

## 2010-12-29 DIAGNOSIS — Z0000 Encounter for general adult medical examination without abnormal findings: Secondary | ICD-10-CM

## 2010-12-29 DIAGNOSIS — N259 Disorder resulting from impaired renal tubular function, unspecified: Secondary | ICD-10-CM

## 2010-12-29 DIAGNOSIS — E1129 Type 2 diabetes mellitus with other diabetic kidney complication: Secondary | ICD-10-CM

## 2010-12-29 DIAGNOSIS — I509 Heart failure, unspecified: Secondary | ICD-10-CM

## 2010-12-29 DIAGNOSIS — I2510 Atherosclerotic heart disease of native coronary artery without angina pectoris: Secondary | ICD-10-CM

## 2010-12-29 DIAGNOSIS — I10 Essential (primary) hypertension: Secondary | ICD-10-CM

## 2010-12-29 DIAGNOSIS — N19 Unspecified kidney failure: Secondary | ICD-10-CM

## 2010-12-29 MED ORDER — LISINOPRIL 5 MG PO TABS: 5.0000 mg | ORAL_TABLET | Freq: Every day | ORAL | Status: DC

## 2010-12-29 NOTE — Assessment & Plan Note (Signed)
Somewhat worse. I've advised absolute abstinence from all nonsteroidal anti-inflammatory drugs. It is okay for him to use acetaminophen as needed for arthritic pain. He should not exceed 2 g daily. I have reviewed previous notes. I think his best to try a low dose ACE inhibitor. Side effects discussed. I will check laboratories next week.

## 2010-12-29 NOTE — Assessment & Plan Note (Signed)
I have reviewed multiple notes. I can't determine why he is not on an ACE inhibitor. I think it's best to try this. We'll start a low dose and follow kidney function regularly. I have reviewed multiple chemistry tests dating back to 2007.

## 2010-12-29 NOTE — Progress Notes (Signed)
  Subjective:    Patient ID: Matthew Bean, male    DOB: November 12, 1932, 77 y.o.   MRN: [id number]  HPI   patient comes in for followup of multiple medical problems including type 2 diabetes, hyperlipidemia, hypertension. The patient does not check blood sugar or blood pressure at home. The patetient does not follow an exercise or diet program. The patient denies any polyuria, polydipsia.  In the past the patient has gone to diabetic treatment center. The patient is tolerating medications  Without difficulty. The patient does admit to medication compliance.   Pt notes increased thickening of toe nails---"i can't cut them anymore"  CHF---no sxs CAD---no chest pain, SOB, PND Renal insufficiency - thought to be DM related---creatinine has been ranging 1.5-1.8 mg/dl. I have reviewed OTC meds with him. He does admit to taking aleve frequently for "a little twinge in my back"  Past Medical History  Diagnosis Date  . Hyperlipidemia   . Diabetes mellitus   . CHF (congestive heart failure)   . Hypertension   . Myocardial infarction   . Nephropathy   . Renal insufficiency   . CAD (coronary artery disease)   . Pulmonary nodule   . Arthritis    Past Surgical History  Procedure Date  . Angioplasty 1997, 2008  . Coronary stent placement 2002    coronary  . Hernia repair     Inguinal and ventral    reports that he has quit smoking. He does not have any smokeless tobacco history on file. He reports that he does not drink alcohol. His drug history not on file. family history includes Stroke in his father and mother. No Known Allergies     Review of Systems  patient denies chest pain, shortness of breath, orthopnea. Denies lower extremity edema, abdominal pain, change in appetite, change in bowel movements. Patient denies rashes, musculoskeletal complaints. No other specific complaints in a complete review of systems.      Objective:   Physical Exam  well-developed well-nourished male in no  acute distress. HEENT exam atraumatic, normocephalic, neck supple without jugular venous distention. Chest clear to auscultation cardiac exam S1-S2 are regular. Abdominal exam overweight with bowel sounds, soft and nontender. Extremities no edema. Neurologic exam is alert with a normal gait. Thickened toenails bilaterally        Assessment & Plan:

## 2010-12-29 NOTE — Assessment & Plan Note (Signed)
Is not having any symptoms. We'll continue current medications. Will add ACE inhibitor.

## 2010-12-29 NOTE — Assessment & Plan Note (Signed)
Controlled See plan for CHF and DM

## 2010-12-29 NOTE — Assessment & Plan Note (Signed)
Much better controlled with Lantus therapy. Will continue same dosing.

## 2011-01-05 ENCOUNTER — Other Ambulatory Visit (INDEPENDENT_AMBULATORY_CARE_PROVIDER_SITE_OTHER): Payer: Medicare Other | Admitting: Internal Medicine

## 2011-01-05 DIAGNOSIS — I10 Essential (primary) hypertension: Secondary | ICD-10-CM

## 2011-01-05 LAB — BASIC METABOLIC PANEL
BUN: 28 mg/dL — ABNORMAL HIGH (ref 6–23)
CO2: 29 mEq/L (ref 19–32)
Calcium: 9.3 mg/dL (ref 8.4–10.5)
Chloride: 108 mEq/L (ref 96–112)
Creatinine, Ser: 1.8 mg/dL — ABNORMAL HIGH (ref 0.4–1.5)
Glucose, Bld: 76 mg/dL (ref 70–99)

## 2011-01-18 ENCOUNTER — Other Ambulatory Visit: Payer: Self-pay | Admitting: *Deleted

## 2011-01-18 MED ORDER — HYDROCHLOROTHIAZIDE 25 MG PO TABS: 25.0000 mg | ORAL_TABLET | Freq: Every day | ORAL | Status: DC

## 2011-01-26 ENCOUNTER — Encounter: Payer: Self-pay | Admitting: Internal Medicine

## 2011-01-26 ENCOUNTER — Ambulatory Visit (INDEPENDENT_AMBULATORY_CARE_PROVIDER_SITE_OTHER): Payer: Medicare Other | Admitting: Internal Medicine

## 2011-01-26 VITALS — BP 124/72 | HR 76 | Temp 98.2°F | Wt 238.0 lb

## 2011-01-26 DIAGNOSIS — N259 Disorder resulting from impaired renal tubular function, unspecified: Secondary | ICD-10-CM

## 2011-01-26 LAB — BASIC METABOLIC PANEL
BUN: 30 mg/dL — ABNORMAL HIGH (ref 6–23)
Calcium: 9.4 mg/dL (ref 8.4–10.5)
Creatinine, Ser: 1.9 mg/dL — ABNORMAL HIGH (ref 0.4–1.5)
GFR: 45.9 mL/min — ABNORMAL LOW (ref >60.00)
Glucose, Bld: 114 mg/dL — ABNORMAL HIGH (ref 70–99)

## 2011-01-26 NOTE — Progress Notes (Signed)
  Subjective:    Patient ID: Matthew Bean, male    DOB: [birth year], 77 y.o.   MRN: [id number]  HPI   patient comes in for followup of multiple medical problems including type 2 diabetes, hyperlipidemia, hypertension. The patient does not check blood sugar or blood pressure at home. The patetient does not follow an exercise or diet program. The patient denies any polyuria, polydipsia.  In the past the patient has gone to diabetic treatment center. The patient is tolerating medications  Without difficulty. The patient does admit to medication compliance.  Note ace-I stopped due to renal insufficiency. Over the years Creatinine has increased from 1.5-2.1  Past Medical History  Diagnosis Date  . Hyperlipidemia   . Diabetes mellitus   . CHF (congestive heart failure)   . Hypertension   . Myocardial infarction   . Nephropathy   . Renal insufficiency   . CAD (coronary artery disease)   . Pulmonary nodule   . Arthritis    Past Surgical History  Procedure Date  . Angioplasty 1997, 2008  . Coronary stent placement 2002    coronary  . Hernia repair     Inguinal and ventral    reports that he has quit smoking. He does not have any smokeless tobacco history on file. He reports that he does not drink alcohol. His drug history not on file. family history includes Stroke in his father and mother. No Known Allergies   Review of Systems  patient denies chest pain, shortness of breath, orthopnea. Denies lower extremity edema, abdominal pain, change in appetite, change in bowel movements. Patient denies rashes, musculoskeletal complaints. No other specific complaints in a complete review of systems.      Objective:   Physical Exam  well-developed well-nourished male in no acute distress. HEENT exam atraumatic, normocephalic, neck supple without jugular venous distention. Chest clear to auscultation cardiac exam S1-S2 are regular. Abdominal exam overweight with bowel sounds, soft and nontender.  Extremities no edema. Neurologic exam is alert with a normal gait.        Assessment & Plan:

## 2011-01-26 NOTE — Assessment & Plan Note (Signed)
Pt does not have any sxs Reviewed last Echo No Ace-I or arb for now secondary to renal insufficiency- May resume after renal visit  SUMMARY- echo May-2008 - LVEF is approximately 40% with hypokinesis inferior wall, mid/distal inferoseptal wall, distal lateral, distal anterior and apex. Left ventricular wall thickness was moderately increased. - AV is thickened, moderately calcified with mildly restricted motion. Mean gradient through the valve is 8 mm HG. There was trivial aortic valvular regurgitation

## 2011-01-26 NOTE — Assessment & Plan Note (Signed)
A1c controlled Weight loss is key to long term success

## 2011-01-26 NOTE — Assessment & Plan Note (Signed)
He had remote stenting of the LAD and PTCA of the circumflex artery. In 2008 he had PTCA of the mid right coronary artery. It was a difficult procedure. The vessel is heavily calcified and we were not able to get a stent to the lesion.  Pt has no sxs Continue risk factor modification

## 2011-02-07 ENCOUNTER — Ambulatory Visit (AMBULATORY_SURGERY_CENTER): Payer: Medicare Other

## 2011-02-07 VITALS — Ht 72.0 in | Wt 235.0 lb

## 2011-02-07 DIAGNOSIS — Z1211 Encounter for screening for malignant neoplasm of colon: Secondary | ICD-10-CM

## 2011-02-07 MED ORDER — PEG-KCL-NACL-NASULF-NA ASC-C 100 G PO SOLR: 1.0000 | Freq: Once | ORAL | Status: AC

## 2011-02-08 ENCOUNTER — Encounter: Payer: Self-pay | Admitting: Internal Medicine

## 2011-02-17 ENCOUNTER — Encounter: Payer: Self-pay | Admitting: Internal Medicine

## 2011-02-20 ENCOUNTER — Ambulatory Visit (AMBULATORY_SURGERY_CENTER): Payer: Medicare Other | Admitting: Internal Medicine

## 2011-02-20 ENCOUNTER — Encounter: Payer: Self-pay | Admitting: Internal Medicine

## 2011-02-20 VITALS — BP 132/80 | HR 89 | Temp 97.2°F | Resp 20

## 2011-02-20 DIAGNOSIS — D126 Benign neoplasm of colon, unspecified: Secondary | ICD-10-CM

## 2011-02-20 DIAGNOSIS — Z1211 Encounter for screening for malignant neoplasm of colon: Secondary | ICD-10-CM

## 2011-02-20 DIAGNOSIS — Z860100 Personal history of colon polyps, unspecified: Secondary | ICD-10-CM | POA: Insufficient documentation

## 2011-02-20 HISTORY — PX: COLONOSCOPY W/ POLYPECTOMY: SHX1380

## 2011-02-20 LAB — GLUCOSE, CAPILLARY
Glucose-Capillary: 117 mg/dL — ABNORMAL HIGH (ref 70–99)
Glucose-Capillary: 64 mg/dL — ABNORMAL LOW (ref 70–99)

## 2011-02-20 MED ORDER — SODIUM CHLORIDE 0.9 % IV SOLN: 500.0000 mL | INTRAVENOUS | Status: DC

## 2011-02-20 MED ORDER — ASPIRIN 81 MG PO TABS: 81.0000 mg | ORAL_TABLET | Freq: Every day | ORAL | Status: DC

## 2011-02-20 NOTE — Progress Notes (Signed)
Patient took both parts of Moviprep 02/19/11. Patient stating last BM was 5/13, muddy in appearance no BM today,  Informed Dr. Leone Payor, Fleets enema ordered and given.

## 2011-02-20 NOTE — Patient Instructions (Addendum)
[employer] reviewed with patient and care partners.  Blue Discharge instructions signed by care partner.  Hold Aspirin for 1 week.  Handout given on polyps.  8 polyps were removed today. They all looked benign but we need to have them analyzed by a pathologist. I will send you a letter about the results and what it means.  Iva Boop, MD, [facility]

## 2011-02-21 ENCOUNTER — Telehealth: Payer: Self-pay

## 2011-02-21 NOTE — Assessment & Plan Note (Signed)
[facility]                            CARDIOLOGY OFFICE NOTE   NAME:Ulloa, MICAJAH DENNIN                         MRN:          [id number]  DATE:03/07/2007                            DOB:          [birth year]    PRIMARY CARE PHYSICIAN:  Valetta Mole. Swords, MD   CLINICAL HISTORY:  Mr. Matera is 77 years old and returned for a followup  visit after his recent hospitalization with unstable angina, treated  with PCI of the right coronary artery. He has known coronary disease and  has had previous stenting of the LAD and PTCA of his circumflex artery.  He also has diabetes, hypertension, hyperlipidemia. He was admitted to  the hospital with chest pain and underwent catheterization and was found  to have a tight lesion in the proximal to mid right coronary artery. He  underwent percutaneous intervention. We were able to open this with the  balloon, but because of its calcification and tortuosity were not able  to get a stent to the lesion. We settled for balloon angioplasty only.  He has done quite well since that time and has had no recurrent chest  pain, shortness of breath, or palpitations. His residual coronary  disease showed no major obstruction in the LAD with a patent stent with  60-70% narrowing in the mid LAD and 80% ostial stenosis of a diagonal  branch and 60% narrowing in the proximal circumflex artery. No left  ventriculogram was performed.   PAST MEDICAL HISTORY:  1. Hypertension.  2. Hyperlipidemia.  3. Diabetes.   CURRENT MEDICATIONS:  1. Aspirin.  2. Plavix which was scheduled to stop today.  3. Lopressor.  4. Micardis.  5. Lantus.  6. Lovastatin.  7. Amlodipine.  8. Glucovance.   PHYSICAL EXAMINATION:  His blood pressure is 154/76, pulse 63 and  regular. There is no venous distention. The carotid pulses were full.  CHEST: Was clear.  CARDIAC: Rhythm was regular. There were no murmurs or gallops.  ABDOMEN: Was soft with normal bowel  sounds. Right femoral artery site  was well-healed.  I had difficulty feeling pedal pulses. There is no peripheral edema.   An ECG showed inferior and lateral T-wave changes.   ADDENDUM:  A CT scan of the chest showed a 6-mm right lower lobe nodule with  recommendations for followup in 6 to 12 months.   IMPRESSION:  1. Coronary artery disease status post prior stenting of the left      anterior descending artery (LAD) and prior percutaneous      transluminal coronary angioplasty (PTCA) of the circumflex artery      with recent unstable angina and percutaneous transluminal coronary      angioplasty (PTCA) of a lesion in the proximal right coronary      artery with residual disease as described above, now stable.  2. Insulin requiring diabetes.  3. Hypertension.  4. Hyperlipidemia.  5. Pulmonary nodule by CT scan.   RECOMMENDATIONS:  I think Mr. Halt is doing well. His blood pressure is  not optimal and will increase his amlodipine from 5  a day to 10 a day.  He is scheduled to see Dr.  Cato Mulligan within the next two weeks and I will  let him make any decisions about further adjustment in his cholesterol  medications and followup on his blood pressure. I will plan to see him  back in followup in 6 months. He did not have any evaluation of his left  ventricular function in the hospital and will plan to do a rest/stress  Myoview scan prior to that visit to assess him for both re-stenosis and  to evaluate his left ventricular function. I will see him for a followup  in 6 months after that scan. Will also get a CT scan of the chest prior  to that visit to followup on his pulmonary nodule as recommended by the  radiologist.     Everardo Beals. Juanda Chance, MD, [facility]  Electronically Signed    BRB/MedQ  DD: 03/07/2007  DT: 03/07/2007  Job #: [id number]   cc:   Valetta Mole. Swords, MD

## 2011-02-21 NOTE — Consult Note (Signed)
Matthew Bean, Matthew Bean                  ACCOUNT NO.:  [id number]   MEDICAL RECORD NO.:  [id number]          PATIENT TYPE:  INP   LOCATION:  1826                         FACILITY:  [facility]   PHYSICIAN:  Matthew Friends. Dietrich Pates, MD, FACCDATE OF BIRTH:  Jun 12, 1933   DATE OF CONSULTATION:  02/18/2007  DATE OF DISCHARGE:                                 CONSULTATION   Referring:  Matthew Signs A. Everardo All, MD  Primary care physician:  Matthew Mole. Swords, MD  Primary cardiologist:  Matthew Duke Salvia, MD, and Matthew Melter,  MD.   HISTORY OF PRESENT ILLNESS:  A 77 year old gentleman admitted to  hospital with recurrent chest pain.  Matthew Bean cardiac history dates to  1994, when he suffered an acute myocardial infarction and was treated  with percutaneous intervention of the proximal left anterior descending  coronary artery.  Approximately 6 months later in December 1994,  intervention to the proximal AV circumflex was performed.  He did well  until 2002, when he returned with left anterior chest discomfort.  Cardiac catheterization demonstrated moderate three-vessel disease and  mild to moderate impairment in LV systolic function with an estimated  ejection fraction of 0.40.  A stent was placed to the mid left anterior  descending in the region of a 70% stenosis.  He then did well until the  day of admission, when he noted moderately severe left anterior chest  pressure.  There was no associated dyspnea, diaphoresis, nor nausea.  There was no radiation of the discomfort.  He called his primary care  physician's office and was directed to the emergency department, where  he was apparently treated only with aspirin.  Nonetheless, his symptoms  gradually faded and now are virtually gone.   Contributors to cardiovascular disease include hypertension, diabetes  and hyperlipidemia.   PAST MEDICAL HISTORY:  Otherwise notable for a ventral hernia repair.   The patient has no known allergies.   CURRENT  MEDICATIONS:  1. Aspirin 81 mg daily.  2. Metformin.  3. Lantus insulin.  4. Micardis.  5. Metoprolol.  6. Lovastatin.   SOCIAL HISTORY:  Married and lives in [place];:  Retired from  [employer] work; prior use of tobacco that was discontinued  approximately 10 years ago.  Recovering alcoholic.  Performs regular  exercise without problems.   FAMILY HISTORY:  Mother died at age 19 with CVA and hypertension.  Father died at age 57, also with CVA and hypertension.  He has a sister  with hypertension who died at age 35 due to neoplastic disease.   REVIEW OF SYSTEMS:  Notable for occasional sweats, urinary hesitancy and  nocturia.  Bean other systems reviewed and are negative.   PHYSICAL EXAMINATION:  GENERAL:  A pleasant, well-appearing gentleman in  no acute distress.  VITAL SIGNS:  The temperature is 97, heart rate 75 and regular,  respirations 18, blood pressure 165/55, O2 saturation 97% on room air.  HEENT:  Bilateral arcus; anicteric sclerae; pupils equal, round, react  to light; normal lids and conjunctivae; normal oral mucosa.  NECK:  No jugular venous distension; normal carotid  upstrokes without  bruits.  ENDOCRINE:  No thyromegaly.  HEMATOPOIETIC:  No adenopathy.  LUNGS:  Clear.  CARDIAC:  Distant first and second heart sounds.  ABDOMEN:  Soft and nontender; normal bowel sounds; no masses; no  organomegaly.  SKIN:  No significant lesions.  EXTREMITIES:  1/2+ ankle edema.  NEUROMUSCULAR:  Symmetric strength and tone; normal cranial nerves.  PSYCHIATRIC:  Alert and oriented; normal affect.   EKG:  Normal sinus rhythm; first-degree AV block; delayed R-wave  progression; prior inferior myocardial infarction; anterolateral T-wave  inversions.  When compared to a prior tracing of November 29, 2006,  inferior Q waves and poor R wave progression persist; ST-T wave  abnormality is more prominent on the current tracing.   IMPRESSION:  Matthew Bean experiences episodes of chest  pain with long  intervals in between.  On each occasion, he has had significant coronary  disease requiring intervention.  His EKG is similar to previous tracings  but somewhat more impressive.  I believe it would be most prudent to  proceed with coronary angiography.  The risks and benefits were  explained to the patient, who agrees to proceed.  For the time being, we  will start intravenous heparin.  Intravenous nitroglycerin will be added  if he experiences additional episodes of chest discomfort.  His other  usual medications will be continued except for metformin.      Matthew Friends. Dietrich Pates, MD, [facility]  Electronically Signed     RMR/MEDQ  D:  02/18/2007  T:  02/19/2007  Job:  [id number]

## 2011-02-21 NOTE — Cardiovascular Report (Signed)
NAMEHALLIE, ISHIDA NO.:  [id number]   MEDICAL RECORD NO.:  [id number]          PATIENT TYPE:  INP   LOCATION:  6523                         FACILITY:  [facility]   PHYSICIAN:  Stacie Glaze, MD    DATE OF BIRTH:  1932-10-16   DATE OF PROCEDURE:  DATE OF DISCHARGE:  02/20/2007                            CARDIAC CATHETERIZATION   HOSPITAL COURSE:  Patient is a 77 year old African-American male who  presented with a five-hour history of moderate left substernal chest  pain, but did not radiate to his shoulders with no other associated  symptoms, but he was admitted because of his past history of myocardial  infarction angioplasty twice in the 90s and coronary catheterization  showing three-vessel disease approximately six years ago.  His risk  factors included diabetes, hypertension and dyslipidemia.   On admission, he medications were metformin, Lantus, Micardis, aspirin,  Lopressor and Mevacor.  He was admitted to the general medicine service.  Cardiology was consulted for his chest pain.  Cardiac catheterization  was obtained on Feb 19, 2003 which showed left anterior disease, 60 to  70% mid and 80% in one of the diagonals.  His circ had a 60% mid and his  RCA had a 70% proximal and 90% mid.  He had PTCA to his RCA reducing it  from 90% to 30% and his other disease was to be medically managed with  Plavix and aspirin.   After his cardiac catheterization, cardiology felt he was stable for  discharge, recommended that he be discharged on Plavix and aspirin at  that time. Other problems noted during this hospitalization are chronic  renal insufficiency.  On admission, his creatinine was 1.6.  His  creatinine at discharge was 1.5.  Given his age and creatinine, it was  recommended that his metformin be discontinued and that he be discharged  on Plavix and aspirin in addition to his prior medications.  I discussed  his diabetic control with his primary care physician  who agreed that he  should be seen in the office within one week and since his A1c was well  controlled at this point, that no new medication would be started until  he had an office visit to determine what was the best medication to  control his diabetes.  He will continue his Lantus insulin which should  be adequate at this time for control.   He is discharged on his home medications with the exception of  discontinuing his metformin.  He is to take per cardiology an enteric-  coated aspirin and 75 mg of Plavix on a daily basis status post PTCA and  stent placement.  He is also on Lopressor 25 mg twice a day, Micardis 40  mg daily which is a reduced dose from his admitting dose.  He is to  continue his Nexium and his Mevacor.      Stacie Glaze, MD  Electronically Signed     JEJ/MEDQ  D:  02/20/2007  T:  02/20/2007  Job:  [id number]   cc:   Valetta Mole. Swords, MD

## 2011-02-21 NOTE — Assessment & Plan Note (Signed)
[facility]                            CARDIOLOGY OFFICE NOTE   NAME:Marxen, Matthew Bean                         MRN:          [id number]  DATE:09/09/2008                            DOB:          08/27/1933    PRIMARY CARE PHYSICIAN:  Valetta Mole. Swords, MD   CLINICAL HISTORY:  [name] is 77 years old who returned for followup  management of his coronary heart disease.  He has had remote stenting of  the LAD.  In last May, he had PTCA of a calcified lesion in the mid-  right coronary artery.  We are unable to get a stent to the lesion, but  we got a reasonable result with angioplasty alone.  He had a followup  scan after that, which showed no evidence of ischemia.   He states he has been doing well recently with no recent chest pain,  shortness of breath, or palpitations.   PAST MEDICAL HISTORY:  Significant for hypertension and diabetes.   CURRENT MEDICATIONS:  1. Aspirin.  2. Lopressor 25 mg b.i.d.  3. Micardis/hydrochlorothiazide 80/25 daily.  4. Lovastatin 40 mg daily.  5. Glucovance.  6. Amlodipine 10 mg daily.   PHYSICAL EXAMINATION:  VITAL SIGNS:  The blood pressure is 146/77 and  pulse 56 and regular.  NECK:  There was no venous distension.  The carotid pulses were full  without bruits.  CHEST:  Clear.  CARDIAC:  Rhythm was regular.  I could hear no murmurs, rubs, or  gallops.  ABDOMEN:  Soft without organomegaly.  EXTREMITIES:  Peripheral pulses were full with no peripheral edema.   IMPRESSION:  1. Coronary artery disease status post multiple percutaneous      interventions.  The last of which was percutaneous transluminal      coronary angioplasty of the mid-right coronary artery, now stable.  2. Good left ventricular function.  3. Hypertension.  4. Diabetes.  5. Hyperlipidemia.  6. Pulmonary nodule by a CT scan.  7. Chronic renal insufficiency with creatinine in the range of 1.7.   RECOMMENDATIONS:  I think, [name] is doing well  from a standpoint of  his heart.  Dr. Cato Mulligan is planning on checking his lipids and he is  managing that, and he also is managing followup CT scan for his  pulmonary nodule.  His cardiac situation is stable and I will plan to  see him back in followup in a year.     Bruce Elvera Lennox Juanda Chance, MD, [facility]  Electronically Signed    BRB/MedQ  DD: 09/09/2008  DT: 09/10/2008  Job #: [id number]   cc:   Valetta Mole. Swords, MD

## 2011-02-21 NOTE — Assessment & Plan Note (Signed)
[facility]                            CARDIOLOGY OFFICE NOTE   NAME:Matthew Bean, Matthew Bean                         MRN:          [id number]  DATE:09/25/2007                            DOB:          Oct 13, 1932    PRIMARY CARE PHYSICIAN:  Dr. Birdie Sons.   PAST MEDICAL HISTORY:  Mr. Pryde is 77 years old and returns for followup  management of his coronary heart disease.  He has had previous stenting  of the LAD and has had previous PTCA of the circumflex artery.  In May  of this year, he had PTCA of a lesion in the mid portion of the right  coronary artery.  We did PTC alone because we were unable to pass a  stent to the lesion due to calcification and tortuosity.  We did a  followup Myoview scan that showed inferior apical scarring, but no  evidence of ischemia and this scan had not changed from a previous scan.   Mr. Damron says he is doing well from a standpoint of his heart with no  chest pain, shortness of breath, or palpitations.   PAST MEDICAL HISTORY:  Significant for hypertension, which has been  somewhat difficult to control and hyperlipidemia, and diabetes.  He said  his blood pressures at home have been as high as 170 systolic.  He says  some of this could be related to using a small cuff.   CURRENT MEDICATIONS:  Aspirin.  Lopressor.  Micardis/hydrochlorothiazide.  Amlodipine.  Lovastatin.  Glucovance.   EXAMINATION:  Blood pressure is 148/78, pulse 59 and regular.  There was no venous distension.  The carotid pulses were full without  bruits.  CHEST:  Clear.  CARDIAC:  Rhythm was regular.  There were no murmurs or gallops.  ABDOMEN:  Soft with normal bowel sounds.  There is no  hepatosplenomegaly.  There was no peripheral edema.  Difficult to obtain pedal pulses.   IMPRESSION:  1. Coronary artery disease status post multiple percutaneous coronary      interventions as described above, the last of which was PTCA of the      mid right coronary  artery with no evidence of ischemia on recent      scan.  2. Hypertension not under optimum control.  3. Insulin dependent diabetes.  4. Hyperlipidemia.  5. Pulmonary nodule by CT scan.  6. Chronic renal insufficiency.   RECOMMENDATIONS:  Mr. Faulcon blood pressure is still not easily  controlled.  He is on 4 drugs, and he is on maximum dose of amlodipine  and beta blocker.  He is reluctant to go up on the Micardis, because he  thinks it can cause dizziness.  I will encourage him to watch the salt  in his diet, lose weight, and exercise regularly, and he can follow up  with Dr. Cato Mulligan next month.  He can decide about further adjustments.  I  will plan to get a CT angiogram to rule out renal artery stenosis.  Creatinine has been in the 1.5 range.  I will plan to see him back in  followup in a year.   ADDENDUM:  He had a pulmonary nodule by CT scan and he had a followup CT  scan of his chest in October 2008 that showed persistence of the nodule,  and a CT scan was recommended a year after that.  I will defer that to  Dr. Cato Mulligan.     Bruce Elvera Lennox Juanda Chance, MD, [facility]  Electronically Signed    BRB/MedQ  DD: 09/25/2007  DT: 09/25/2007  Job #: [id number]

## 2011-02-21 NOTE — Telephone Encounter (Signed)
Follow up Call- Patient questions:  Do you have a fever, pain , or abdominal swelling? no Pain Score  0 *  Have you tolerated food without any problems? yes  Have you been able to return to your normal activities? yes  Do you have any questions about your discharge instructions: Diet   no Medications  no Follow up visit  no  Do you have questions or concerns about your Care? no  Actions: * If pain score is 4 or above: No action needed, pain <4. I woke the pt up.  No complaints. MAW

## 2011-02-21 NOTE — H&P (Signed)
NAMEHARVARD, ZEISS                  ACCOUNT NO.:  [id number]   MEDICAL RECORD NO.:  [id number]          PATIENT TYPE:  EMS   LOCATION:  MAJO                         FACILITY:  [facility]   PHYSICIAN:  Sean A. Everardo All, MD    DATE OF BIRTH:  [birth year]   DATE OF ADMISSION:  02/18/2007  DATE OF DISCHARGE:                              HISTORY & PHYSICAL   REASON FOR ADMISSION:  Chest pain.   HISTORY OF PRESENT ILLNESS:  A 77 year old man who reports a 5-hour  history of moderate left substernal chest pain which does not radiate to  the shoulders.  No associated symptoms.  He was seen in the emergency  department and states he was given some aspirin, which has caused the  pain to almost completely resolve.   PAST MEDICAL HISTORY:  1. Myocardial infarction in the early 1990s.  He apparently had      percutaneous coronary angioplasty twice in the 1990s and he had      another cardiac catheterization 6 years ago showing three-vessel      coronary disease.  2. Diabetes.  3. Hypertension.  4. Dyslipidemia.   MEDICATIONS (DOSAGES UNKNOWN):  Metformin, Lantus, Micardis, aspirin,  Lopressor and Mevacor.   FAMILY HISTORY:  Negative for DVT and heart disease.   SOCIAL HISTORY:  He is retired.  He is married.  Does not drink or  smoke.   REVIEW OF SYSTEMS:  He has slight urinary hesitancy and a slight  headache, but he denies the following:  Weight loss, visual loss,  seizure, sore throat, earache, cough, abdominal pain, rectal bleeding,  hematuria, dysuria, and skin rash.   PHYSICAL EXAMINATION:  VITAL SIGNS:  Blood pressure is 163/87, heart  rate 73, respiratory rate 20, and he is febrile.  GENERAL:  A healthy-appearing man in no distress.  SKIN:  No rash, not diaphoretic.  HEENT:  No evidence of head trauma to external examination.  No  proptosis.  No periorbital swelling.  Pharynx is normal.  NECK:  Supple.  CHEST:  Clear to auscultation.  No respiratory distress.  The chest wall  is  nontender.  CARDIOVASCULAR:  No JVD,  There is 1+ bilateral edema.  Regular rate and  rhythm.  No murmur.  Pedal pulses are intact.  ABDOMEN:  Soft, nontender.  No hepatosplenomegaly, no mass.  GENITAL/RECTAL:  Examination not done at this time due to patient  condition.  EXTREMITIES: No deformities seen.  NEUROLOGIC:  Alert, well-oriented.  Cranial nerves appear to be intact  and he readily moves all fours.   LABORATORY STUDIES:  Electrocardiogram is not currently available.  D-  dimer is 2.40.  Initial cardiac enzymes are negative.  Hemoglobin,  hematocrit, sodium, potassium, BUN are normal.  Glucose 130.   IMPRESSION:  1. Chest pain of uncertain etiology.  2. History of coronary artery disease.  3. Diabetes, for which he is on uncertain medication dosages.  4. Hypertension, uncertain medication and dosages.  5. Dyslipidemia, uncertain medication dosages.   PLAN:  1. Check CT scan of the chest.  2. Finish cardiac enzymes series.  3. Change insulin to p.r.n. for now for safety reasons.  4. Full code at the patient's request.  5. Symptomatic therapy.  6. PPI therapy.  7. I will prescribe his other medications at empiric dosages, as I do      know his home dosages now.      Sean A. Everardo All, MD  Electronically Signed     SAE/MEDQ  D:  02/18/2007  T:  02/19/2007  Job:  [id number]   cc:   Valetta Mole. Swords, MD

## 2011-02-21 NOTE — Cardiovascular Report (Signed)
NAMEBRAIDON, CHERMAK                  ACCOUNT NO.:  [id number]   MEDICAL RECORD NO.:  [id number]          PATIENT TYPE:  INP   LOCATION:  4707                         FACILITY:  [facility]   PHYSICIAN:  Everardo Beals. Juanda Chance, MD, FACCDATE OF BIRTH:  March 10, 1933   DATE OF PROCEDURE:  02/19/2007  DATE OF DISCHARGE:                            CARDIAC CATHETERIZATION   PAST MEDICAL HISTORY:  Mr. Jeancharles is 77 years old and has known coronary  disease.  He had previous PTCA of the circumflex artery and had a stent  placed to the LAD in 2002.  He was admitted day with chest pain that he  says was very similar to what he had when he had his previous stents  placed.   PROCEDURE:  The procedure was performed via the right femoral artery  using arterial sheath and 6-French preformed coronary catheters.  A  front wall arterial puncture was performed and Omnipaque contrast was  used.  After completion of the diagnostic study, made the decision to  proceed with intervention on the right coronary.   The patient given AngioMax bolus and infusion and had previously been  given an aspirin load and was given a Plavix load.  The vessel was  tortuous and calcified in the proximal portion so we chose an AL-1 6-  [place] guiding catheter with side holes.  We passed a PT two wire,  crossed the lesion without difficulty.  We then passed a 2.25 x 12 mm  Maverick to the lesion and dilated up to 12 atmospheres for 30 seconds.  We attempted to pass a 3.0 x 16 mm Endeavor stent, but were unable to  pass the stent beyond the proximal bend where there was calcification.  We then tried a 3.0 x 12 mm Endeavor stent and still were unable to pass  the stent.  We finally tried a 3.0 x 8 mm Endeavor stent thinking we  could overlap 2 short stents, but still were unable to pass the stents  across the lesion despite the use of buddy wire and an Amplatz  supporting guiding catheter.  We finally had to settle for a balloon and  went back in  with a 3.0 and a 3.25 x 15 Maverick balloon and performed a  total of 4 inflations up to 8 atmospheres for 30 seconds.  Final  diagnostic studies were then performed through the guiding catheter.  The final result was less than optimal, but satisfactory.   RESULTS:  THE LEFT MAIN CORONARY ARTERY is free of significant disease.   LEFT ANTERIOR DESCENDING ARTERY gave rise to a large diagonal branch, 2  small diagonal branch and 3 septal perforators.  There was a stent in  the proximal vessel that had less than 10% stenosis.  There was 80%  stenosis at the osteum of the first large diagonal branch which arose  from the stent.  There was calcification of the proximal and mid vessel.  There was 60-70% narrowing in the mid vessel.   THE CIRCUMFLEX ARTERY gave rise to a marginal branch and a  posterolateral branch.  There  was 60% narrowing in the proximal to mid  vessel and 60% narrowing in a posterolateral branch.   THE RIGHT CORONARY ARTERY was a moderate-sized vessel which gave rise to  right ventricular branch, posterior descending branch and 3  posterolateral branches.  There was calcification and a sharp bend in  the proximal area of the vessel.  There was 70% segmental narrowing in  this area.  There was a 90% stenosis in the mid vessel located at the  takeoff of the right ventricular branch.   No left ventriculogram was performed.   After PTCA of the lesion in the mid right coronary, stenosis improved  from 90% to 30%.   HEMODYNAMIC DATA:  Aortic pressure was 151/83 with mean of 113 and left  ventricular pressure was 151/11.   CONCLUSIONS:  1. Coronary artery status post previous percutaneous coronary      intervention as described above with less than 10% stenosis at the      stent site in the proximal left anterior descending, 60-70%      narrowing in the mid left anterior descending, 80% osteal stenosis      in the first diagonal branch of the left anterior descending, 60%       stenosis in the proximal mid circumflex artery, 50% stenosis in the      posterolateral branch of the circumflex artery, 70% proximal and      90% mid stenosis in the right coronary.  2. Successful percutaneous transluminal coronary angioplasty of the      lesion in the mid right coronary artery using balloon angioplasty      only with improvement in central narrowing from 90% to 30%.   DISPOSITION:  The patient was returned to __________ for further  observation.  We were unable to place a stent at the lesion and the  result is not totally optimal, so the recurrence rate will be higher  than usual.  He also has a moderate amount of nonobstructive disease.  We will plan to continue secondary risk factor modification.      Bruce Elvera Lennox Juanda Chance, MD, [facility]  Electronically Signed     BRB/MEDQ  D:  02/19/2007  T:  02/19/2007  Job:  [id number]   cc:   Valetta Mole. Cato Mulligan, MD  Duke Salvia, MD, [facility]  Bruce R. Juanda Chance, MD, [facility]  Cardiopulmonary

## 2011-02-24 NOTE — Cardiovascular Report (Signed)
[place]. [facility]  Patient:    Matthew Bean, Matthew Bean                         MRN: [id number] Proc. Date: 11/26/00 Adm. Date:  [id number] Attending:  Nathen May CC:         Nathen May, M.D., [facility]  Bruce H. Swords, M.D. [facility]   Cardiac Catheterization  PROCEDURES PERFORMED 1. Left heart catheterization. 2. Left ventriculogram. 3. Selective coronary angiography.  DIAGNOSES 1. Three-vessel coronary artery disease. 2. Mild left ventricular systolic dysfunction.  HISTORY:  Matthew Bean is a 77 year old black gentleman with a history of diabetes mellitus, hypertension, hyperlipidemia and coronary artery disease, who was admitted to [facility] with recurrent substernal chest discomfort. The patient has a history of an anterior myocardial infarction on January 27, 1993, at which time he underwent percutaneous intervention of the proximal LAD; he subsequently underwent intervention to the proximal A-V circumflex on September 19, 1993.  He presents now with chest discomfort similar but not as severe as his prior pain.  The patient ruled out for acute myocardial infarction and he presents now for further cardiac assessment.  TECHNIQUE:  Informed consent was obtained.  The patient was brought to the catheterization lab.  A 6-French sheath was placed in the right femoral artery.  Selective angiography was then performed using preformed Judkins catheters.  A left ventriculogram was performed using a 6-French pigtail catheter.  At the termination of the case, the catheters and sheath were removed and manual pressure applied until adequate hemostasis was achieved. The patient tolerated the procedure well.  FINDINGS 1. Left main trunk:  Mild ostial taper of 30%. 2. LAD:  This is a large-caliber vessel that provides a large first diagonal    branch in the proximal segment and two smaller diagonal branches in the    midsection.  There is moderate  diffuse disease in the proximal and    midsection of 50%.  There is a focal narrowing of 70% between the second    and third diagonal branches.  The first diagonal branch has an ostial    narrowing of 50-60%.  The LAD is a large wrap-around vessel.  The apical    segment has diffuse disease of 30-40%. 3. Left circumflex artery:  This is a large-caliber vessel that provides two    marginal branches.  There is an ostial narrowing of at least 50-60%.  There    is then diffuse disease in the proximal and mid A-V circumflex of 50%.  The    marginal branches have mild diffuse disease. 4. Right coronary artery:  The right coronary artery is dominant.  This is a    medium-caliber vessel that provides a large RV marginal branch in the    midsection and a small posterior descending artery and posterior    ventricular branch in the terminal segment.  The right coronary artery has    a high-grade stenosis of 70% in the proximal segment; there is a further    narrowing of 50% in the midsection at the takeoff of the RV marginal    branch.  The posterior descending artery and posterior ventricular branches    are small-caliber vessels, as previously noted, that taper prior to    reaching the midsection of the inferior wall.  The RV marginal branch has a    focal narrowing of 70% in the midsection. 5. LV:  Mildly  dilated in end-systolic and end-diastolic dimensions.  Overall    left ventricular function is mildly impaired with ejection fraction of    approximately 40%.  There is hypokinesis of the mid-anterior wall as well    as the mid-inferior wall.  No mitral regurgitation is noted.  Overall left    ventricular ejection fraction is approximately 40%.  PRESSURES:  LV pressure is 130/5, aortic equals 130/75, LVEDP equals 15.  ASSESSMENT AND PLAN:  Matthew Bean is a 77 year old gentleman with severe coronary artery disease in the mid left anterior descending as well as the proximal right coronary artery.  He  has moderate disease in the left circumflex system with mild left ventricular dysfunction.  Given his significant co-morbidities, further discussion with the patients family will be pursued regarding revascularization options. DD:  11/26/00 TD:  11/26/00 Job: [id number]

## 2011-02-24 NOTE — Cardiovascular Report (Signed)
[place]. [facility]  Patient:    Matthew Bean, Matthew Bean                         MRN: [id number] Proc. Date: 11/28/00 Adm. Date:  [id number] Attending:  Nathen May CC:         Valetta Mole Swords, M.D. [facility]   Cardiac Catheterization  PROCEDURES PERFORMED: 1. Selective coronary angiography. 2. Percutaneous transluminal coronary angioplasty and stenting of the    mid left anterior descending.  DIAGNOSES: 1. Severe single-vessel coronary artery disease. 2. Unstable angina.  INDICATIONS:  Matthew Bean is a 77 year old black gentleman with a history of diabetes mellitus, hypertension, hyperlipidemia, and coronary artery disease, who presents with substernal chest discomfort.  The patient underwent cardiac catheterization on November 26, 2000, showing moderate diffuse disease of 30-50% in the mid LAD with a focal narrowing of 70% between the second and third diagonal branches.  This was within a large caliber vessel and the functional significance of this lesion was unknown.  Further assessment with a stress Cardiolite study was performed showing ischemia in the distal wall, and he presents now for percutaneous intervention to the LAD.  TECHNIQUE:  Informed consent was obtained, the patient was brought to the cardiac catheterization lab and a 7 French sheath was placed in the right femoral artery.  A 7 French Q4 guide catheter was then used to engage the left coronary artery and selective angiography was performed using manual injections of contrast.  The patient was then given heparin and Integrilin on a weight-adjusted basis to maintain ACT of approximately 200 seconds.  A 0.014 inch Patriot wire was advanced in the distal LAD and a 3.5 x 15 mm NIR Elite stent primarily deployed in the mid LAD between the second and third diagonal branch with a high-grade narrowing of 70% for 14 atmospheres at 60 seconds. Repeat angiography showed mild stepup due to residual  disease just prior to the stent area, and the stent delivery balloon was inflated just proximal to the deployed stent at 8 atmospheres for 60 seconds.  Repeat angiography was then performed after the administration of intracoronary nitroglycerin showing significant improvement in vessel lumen with reduction of 70% narrowing to 0%. There was residual disease of 30-50% in the mid section prior to the stented segment that was considered noncritical.  Final angiography was performed in various projections confirming no evidence of distal vessel damage and TIMI-3 flow through the LAD.  The guide catheter was then removed and sheath secured in position.  The patient tolerated the procedure well and was transferred to the ward in stable condition.  FINAL RESULTS:  Successful PTCA and stenting of the mid LAD with reduction of 70% narrowing to 0% with placement of a 3.5 x 15 mm NIR Elite stent. DD:  11/28/00 TD:  11/29/00 Job: [id number]

## 2011-02-24 NOTE — Cardiovascular Report (Signed)
[place]. [facility]  Patient:    Matthew Bean, Matthew Bean                         MRN: [id number] Proc. Date: 12/06/00 Adm. Date:  [id number] Attending:  Nathen May CC:         Nathen May, M.D., [facility]  Bruce H. Swords, M.D. [facility]   Cardiac Catheterization  PROCEDURES PERFORMED: 1. Left heart catheterization. 2. Left ventriculogram. 3. Selective coronary angiography.  DIAGNOSES: 1. Three vessel coronary artery disease. 2. Mild left ventricular systolic dysfunction.  HISTORY:  [name] is a 77 year old black gentleman with a history of diabetes mellitus, hypertension, hyperlipidemia and coronary artery disease who was admitted to [facility] with recurrent substernal chest discomfort. The patient has a history of anterior myocardial infarction on January 27, 1993 at which time he underwent percutaneous intervention of the proximal LAD.  He subsequently underwent intervention to the proximal AV circumflex on September 19, 1993.  He presents now with chest discomfort, similar, but not as severe as his prior pain.  The patient ruled out for acute myocardial infarction and he presents now for further cardiac assessment.  TECHNIQUE:  Informed consent was obtained.  The patient was brought to the catheterization laboratory and a #6 French sheath was placed in the right femoral artery.  Selective angiography was then performed using preformed Judkins catheters.  A left ventriculogram was performed using a #6 French pigtail catheter.  At the termination of the case, the catheters and sheaths were removed and manual pressure applied until adequate hemostasis was achieved.  The patient tolerated the procedure well.  FINDINGS: 1. Left main coronary artery:  The left main trunk revealed mild ostial    taper of 30%. 2. Left anterior descending artery:  This is a large caliber vessel and    provides a large first diagonal branch in the proximal  segment and two    smaller diagonal branches in the midsection.  There is moderate diffuse   disease in the proximal midsection of 50%.  There is a focal narrowing of  70% between the 2nd and 3rd diagonal branches.  The 1st diagonal branch  has an ostial narrowing of 50-60%.  The left anterior descending artery is  a large wrap-around vessel.  The apical segment had diffuse disease of  30-40%. 3. Left circumflex coronary artery:  This is a large caliber vessel and    provides two marginal branches.  There is an ostial narrowing of at least    50-60%, there is then diffuse disease in the proximal and mid-AV    circumflex of 50%.  The marginal branches have mild diffuse disease. 4. Right coronary artery:  The right coronary artery is dominant.  This is a    medium caliber vessel that provides a large RV marginal branch in the    midsection and a small posterior descending artery and posterior   ventricular branch in the terminal segment.  The right coronary artery has   a high grade stenosis of 70% in the proximal segment.  There is a further   narrowing of 50% in the midsection of the takeoff at the RV marginal  branch.  The posterior descending artery and posterior ventricular  branches are small caliber vessels as previously noted that taper prior to  reaching the midsection of the inferior wall.  The RV marginal branch has a focal narrowing of 70% in the  midsection. LEFT VENTRICULOGRAM: 1. The left ventricle is mildly dilated in systolic and end-diastolic dimensions. 2. Overall left ventricular function is mildly impaired. 3. The ejection fraction is approximately 40%. 4. There is hypokinesis of the midanterior wall as well as the midinferior   wall. 5. No mitral regurgitation is noted. 6. Overall left ventricular ejection fraction is approximately 40%. 7. Left ventricular pressure is 130/5. 8. Aortic pressure is 130/75. 9. Left ventricular end-diastolic pressure equals  15.  ASSESSMENT AND PLAN:  [name] is a 77 year old gentleman with severe coronary artery disease in the mid left anterior descending artery as well as proximal right coronary artery.  He has moderate disease in the left circumflex coronary system with mild left ventricular dysfunction. The significant comorbidities further discussion with the patients family will be pursued regarding revascularization options. DD:  11/26/00 TD:  11/26/00 Job: [id number]

## 2011-02-24 NOTE — Discharge Summary (Signed)
[place]. [facility]  Patient:    Matthew Bean, Matthew Bean                           MRN: [id number] Adm. Date:  11/23/00 Disc. Date: 11/29/00 Attending:  Veneda Melter, M.D. [facility] Dictator:   Delton See, P.A. CC:         Valetta Mole. Swords, M.D. [place]  Veneda Melter, M.D. [facility]   Discharge Summary  DATE OF BIRTH:  12/24/1932  HISTORY ON ADMISSION:  Matthew Bean is a 77 year old male who had an MI in 1995, at which time he had a PTCA performed.  We do not have the details regarding that procedure.  He was admitted to [facility]. [facility] on November 23, 2000, after he awoke with chest pain eventually relieved by nitroglycerin in the emergency room.  PAST MEDICAL HISTORY:  Significant for an MI as noted above.  He has diabetes mellitus, hyperlipidemia, and hypertension.  He is status post ventral hernia repair.  ALLERGIES:  No known drug allergies.  SOCIAL HISTORY:  The patient does not smoke.  He quit 10 years ago.  He drinks four alcohol drinks per week.  HOSPITAL COURSE:  As noted, this patient was admitted to [facility]. [facility] for further evaluation of chest pain.  Arrangements were made for cardiac catheterization which was performed on November 26, 2000, by Veneda Melter, M.D.  The patient was found to have three-vessel coronary artery disease with mild LV dysfunction and EF 40%.  It was felt that he would need percutaneous intervention or possibly bypass surgery.  He was scheduled for an exercise Cardiolite that was performed on November 27, 2000.  This revealed apical and anteroapical ischemia with a previous inferior infarct, EF estimated to be 36%.  A decision was made to proceed with percutaneous intervention on November 28, 2000.  The patient had a PTCA stent performed of the LAD lesion, reducing it from 70% to 0%.  The patient tolerated the procedure well.  Arrangements were made to discharge the patient the following day.  LABORATORY DATA:   Please see Cardiolite as noted above.  An EKG showed sinus rhythm, rate 54 beats per minute with an old inferior infarct and T-wave inversions in V4-V6, as well as inferiorly.  These changes were not felt to be new.  A CBC on the day of discharge revealed hemoglobin 13.5, hematocrit 40.5, WBC 5400, and platelets 148,000.  A basic metabolic panel on the day of discharge revealed BUN 13, creatinine 1.0, potassium 3.5, and glucose 136.  DISCHARGE MEDICATIONS: 1. Plavix 75 mg daily for four weeks. 2. Lipitor as previously taken. 3. Coated aspirin 325 mg daily. 4. Lopressor 50 mg one half tablet twice each day. 5. Hyzaar 100/25 one daily. 6. GlucoVance.  The patient was told to restart this on Saturday following his    Thursday discharge. 7. Nitroglycerin p.r.n. for chest pain.  The patient had previously been on digoxin.  This medication was discontinued during this admission.  ACTIVITY:  The patient was told to avoid any strenuous activity or driving for two days.  He was told that he could return to work on Wednesday, December 05, 2000.  DIET:  He was to be on a low-salt, low-fat, diabetic diet.  SPECIAL INSTRUCTIONS:  He was told to call the office if he had any increased pain, swelling, or bleeding from his groin.  He was to have a basic metabolic  panel at his next office visit.  He was to have an outpatient dietary consult and cardiac rehabilitation.  FOLLOW-UP:  The patient was told to see Veneda Melter, M.D., in six weeks and Bruce H. Swords, M.D., as needed or as scheduled.  He was to follow up with the [facility] physician assistance on December 13, 2000, at 8:30 a.m.  PROBLEM LIST AT THE TIME OF DISCHARGE: 1. Status post percutaneous transluminal coronary angioplasty stent of the    left anterior descending performed on November 28, 2000. 2. Mild to moderate left ventricular dysfunction, ejection fraction 40%. 3. Diffuse three-vessel coronary artery disease.  Please see  complete dictated    report from Veneda Melter, M.D. 4. Diabetes mellitus. 5. Hyperlipidemia. 6. History of hypertension. 7. Previous myocardial infarction in 1994 with percutaneous transluminal    coronary angioplasty performed at that time. 8. Mildly low potassium level supplemented prior to discharge. DD:  11/29/00 TD:  11/29/00 Job: [id number]

## 2011-02-28 ENCOUNTER — Encounter: Payer: Self-pay | Admitting: Internal Medicine

## 2011-04-27 ENCOUNTER — Ambulatory Visit: Payer: Medicare Other | Admitting: Internal Medicine

## 2011-05-01 ENCOUNTER — Ambulatory Visit: Payer: Medicare Other | Admitting: Internal Medicine

## 2011-05-10 LAB — HM DIABETES FOOT EXAM

## 2011-05-22 ENCOUNTER — Ambulatory Visit (INDEPENDENT_AMBULATORY_CARE_PROVIDER_SITE_OTHER): Payer: Medicare Other | Admitting: Internal Medicine

## 2011-05-22 DIAGNOSIS — D649 Anemia, unspecified: Secondary | ICD-10-CM

## 2011-05-22 DIAGNOSIS — M069 Rheumatoid arthritis, unspecified: Secondary | ICD-10-CM

## 2011-05-22 DIAGNOSIS — E1129 Type 2 diabetes mellitus with other diabetic kidney complication: Secondary | ICD-10-CM

## 2011-05-22 DIAGNOSIS — N259 Disorder resulting from impaired renal tubular function, unspecified: Secondary | ICD-10-CM

## 2011-05-22 DIAGNOSIS — Z23 Encounter for immunization: Secondary | ICD-10-CM

## 2011-05-22 LAB — HEMOGLOBIN A1C: Hgb A1c MFr Bld: 6.6 % — ABNORMAL HIGH (ref 4.6–6.5)

## 2011-05-22 LAB — BASIC METABOLIC PANEL
BUN: 28 mg/dL — ABNORMAL HIGH (ref 6–23)
Calcium: 8.9 mg/dL (ref 8.4–10.5)
Creatinine, Ser: 1.9 mg/dL — ABNORMAL HIGH (ref 0.4–1.5)
GFR: 44.75 mL/min — ABNORMAL LOW (ref >60.00)
Glucose, Bld: 88 mg/dL (ref 70–99)

## 2011-05-22 LAB — CBC WITH DIFFERENTIAL/PLATELET
Basophils Absolute: 0.1 10*3/uL (ref 0.0–0.1)
HCT: 40.5 % (ref 39.0–52.0)
Hemoglobin: 13.2 g/dL (ref 13.0–17.0)
Lymphs Abs: 1.4 10*3/uL (ref 0.7–4.0)
MCHC: 32.6 g/dL (ref 30.0–36.0)
MCV: 95 fl (ref 78.0–100.0)
Monocytes Relative: 9.4 % (ref 3.0–12.0)
Neutro Abs: 6 10*3/uL (ref 1.4–7.7)
RDW: 15.4 % — ABNORMAL HIGH (ref 11.5–14.6)

## 2011-05-22 LAB — LIPID PANEL
Cholesterol: 110 mg/dL (ref 0–200)
HDL: 38 mg/dL — ABNORMAL LOW (ref >39.00)
Triglycerides: 110 mg/dL (ref 0.0–149.0)

## 2011-05-22 LAB — HEPATIC FUNCTION PANEL: Albumin: 3.8 g/dL (ref 3.5–5.2)

## 2011-05-22 NOTE — Assessment & Plan Note (Signed)
Reviewed colonoscopy Check labs today Lab Results  Component Value Date   WBC 7.0 06/15/2010   HGB 13.6 06/15/2010   HCT 41.3 06/15/2010   MCV 92.9 06/15/2010   PLT 137.0* 06/15/2010

## 2011-05-22 NOTE — Assessment & Plan Note (Signed)
Check labs today Continue curent meds for now

## 2011-05-22 NOTE — Progress Notes (Signed)
  Subjective:    Patient ID: Matthew Bean, male    DOB: 09/13/1933, 77 y.o.   MRN: [id number]  HPI  Patient Active Problem List  Diagnoses  . DIABETES MELLITUS, TYPE II, CONTROLLED, W/RENAL COMPS---needs f/u labs  . HYPERLIPIDEMIA--tolerating meds, needs f/u  . GOUT---no recurrence, meds  . ANEMIA, OTHER UNSPEC---could be related to RA or CRI  . HYPERTENSION---no trouble tolerating meds  .   Mar[place] Kitchen CORONARY ARTERY DISEASE--no sxs  . CONGESTIVE HEART FAILURE- no SOB, PND, orthopnea  . RENAL INSUFFICIENCY---needs regular f/u  . RHEUMATOID ARTHRITIS---doing much better on MTX    Past Medical History  Diagnosis Date  . Hyperlipidemia   . Diabetes mellitus   . CHF (congestive heart failure)   . Hypertension   . Myocardial infarction   . Nephropathy   . Renal insufficiency   . CAD (coronary artery disease)   . Pulmonary nodule   . Rheumatoid arthritis   . Hx of adenomatous colonic polyps    Past Surgical History  Procedure Date  . Angioplasty 1997, 2008  . Coronary stent placement 2002    coronary  . Hernia repair     Inguinal and ventral  . Colonoscopy w/ polypectomy 02/20/2011    8 polyps removed, largest 1 cm, mix of adenomas, lymphoid aggregates and hyperplastic    reports that he quit smoking about 22 years ago. He quit smokeless tobacco use about 5 years ago. He reports that he does not drink alcohol or use illicit drugs. family history includes Stroke in his father and mother. No Known Allergies   Review of Systems  patient denies chest pain, shortness of breath, orthopnea. Denies lower extremity edema, abdominal pain, change in appetite, change in bowel movements. Patient denies rashes, musculoskeletal complaints. No other specific complaints in a complete review of systems except he has developed a nonproductive cough for 1 week. No SOB. Cough is mostly nocturnal.     Objective:   Physical Exam  well-developed well-nourished male in no acute distress. HEENT exam  atraumatic, normocephalic, neck supple without jugular venous distention. Chest clear to auscultation cardiac exam S1-S2 are regular. Abdominal exam overweight with bowel sounds, soft and nontender. Extremities no edema. Neurologic exam is alert with a normal gait.        Assessment & Plan:

## 2011-05-22 NOTE — Assessment & Plan Note (Signed)
Has been stable Check labs today

## 2011-05-22 NOTE — Assessment & Plan Note (Signed)
sxs are much improved Followed by rheumatology

## 2011-05-31 ENCOUNTER — Ambulatory Visit: Payer: Medicare Other | Admitting: Internal Medicine

## 2011-06-16 ENCOUNTER — Other Ambulatory Visit: Payer: Self-pay | Admitting: *Deleted

## 2011-06-16 ENCOUNTER — Ambulatory Visit (INDEPENDENT_AMBULATORY_CARE_PROVIDER_SITE_OTHER): Payer: Medicare Other | Admitting: Family Medicine

## 2011-06-16 ENCOUNTER — Ambulatory Visit (INDEPENDENT_AMBULATORY_CARE_PROVIDER_SITE_OTHER)
Admission: RE | Admit: 2011-06-16 | Discharge: 2011-06-16 | Disposition: A | Discharge status: Home / Self Care | Payer: Medicare Other | Source: Ambulatory Visit | Attending: Family Medicine | Admitting: Family Medicine

## 2011-06-16 VITALS — BP 100/58 | Temp 98.4°F | Wt 226.0 lb

## 2011-06-16 DIAGNOSIS — R05 Cough: Secondary | ICD-10-CM

## 2011-06-16 DIAGNOSIS — I509 Heart failure, unspecified: Secondary | ICD-10-CM

## 2011-06-16 DIAGNOSIS — R059 Cough, unspecified: Secondary | ICD-10-CM

## 2011-06-16 MED ORDER — HYDROCODONE-HOMATROPINE 5-1.5 MG/5ML PO SYRP: 5.0000 mL | ORAL_SOLUTION | Freq: Four times a day (QID) | ORAL | Status: AC | PRN

## 2011-06-16 MED ORDER — LEVOFLOXACIN 500 MG PO TABS: 500.0000 mg | ORAL_TABLET | Freq: Every day | ORAL | Status: AC

## 2011-06-16 MED ORDER — AMLODIPINE BESYLATE 10 MG PO TABS: 10.0000 mg | ORAL_TABLET | Freq: Every day | ORAL | Status: DC

## 2011-06-16 NOTE — Progress Notes (Signed)
  Subjective:    Patient ID: Matthew Bean, male    DOB: [birth year], 77 y.o.   MRN: [id number]  HPI Patient seen with one week history of progressive cough. Cough especially at night. Ex-smoker quit several years ago. Cough productive of yellow sputum. No fever. Denies nasal congestion. Tried Delsym cough syrup without much relief. He does not have any history of COPD or asthma. Chronic medical problems include type 2 diabetes and hypertension. Also takes methotrexate for arthritis. Patient denies any pleuritic pain, hemoptysis, or any weight changes. No dyspnea with activity.  Past Medical History  Diagnosis Date  . Hyperlipidemia   . Diabetes mellitus   . CHF (congestive heart failure)   . Hypertension   . Myocardial infarction   . Nephropathy   . Renal insufficiency   . CAD (coronary artery disease)   . Pulmonary nodule   . Rheumatoid arthritis   . Hx of adenomatous colonic polyps    Past Surgical History  Procedure Date  . Angioplasty 1997, 2008  . Coronary stent placement 2002    coronary  . Hernia repair     Inguinal and ventral  . Colonoscopy w/ polypectomy 02/20/2011    8 polyps removed, largest 1 cm, mix of adenomas, lymphoid aggregates and hyperplastic    reports that he quit smoking about 22 years ago. He quit smokeless tobacco use about 5 years ago. He reports that he does not drink alcohol or use illicit drugs. family history includes Stroke in his father and mother. No Known Allergies    Review of Systems  Constitutional: Positive for fatigue. Negative for fever, chills, appetite change and unexpected weight change.  HENT: Negative for congestion and sore throat.   Respiratory: Positive for cough. Negative for shortness of breath and wheezing.   Cardiovascular: Negative for chest pain, palpitations and leg swelling.  Neurological: Negative for dizziness and headaches.       Objective:   Physical Exam  Constitutional: He appears well-developed and  well-nourished.  HENT:  Right Ear: External ear normal.  Left Ear: External ear normal.  Mouth/Throat: Oropharynx is clear and moist.  Neck: Neck supple.  Cardiovascular: Normal rate and regular rhythm.   Pulmonary/Chest: Effort normal. He has no wheezes. He has rales.       Patient has rales right base. Left base is clear. No wheezes. No retractions. Normal respiratory rate. Pulse oximetry 97%  Lymphadenopathy:    He has no cervical adenopathy.          Assessment & Plan:  Patient presents with cough and finding of rales right base-?acute vs chronic. No clear history of fever. Chest x-ray to rule out right lower lobe pneumonia. Levaquin 500 mg daily for 10 days. Hycodan for cough relief at night

## 2011-06-18 ENCOUNTER — Encounter: Payer: Self-pay | Admitting: Family Medicine

## 2011-07-06 ENCOUNTER — Other Ambulatory Visit: Payer: Self-pay | Admitting: Family Medicine

## 2011-07-06 NOTE — Telephone Encounter (Signed)
No refill for levaquin

## 2011-07-06 NOTE — Telephone Encounter (Signed)
Last filled 06-16-11, #10 with 0 refills

## 2011-09-11 ENCOUNTER — Encounter: Payer: Self-pay | Admitting: Cardiovascular Disease

## 2011-09-11 ENCOUNTER — Ambulatory Visit (INDEPENDENT_AMBULATORY_CARE_PROVIDER_SITE_OTHER): Payer: Medicare Other | Admitting: Cardiovascular Disease

## 2011-09-11 VITALS — BP 138/75 | HR 82 | Ht 72.0 in | Wt 228.0 lb

## 2011-09-11 DIAGNOSIS — I2510 Atherosclerotic heart disease of native coronary artery without angina pectoris: Secondary | ICD-10-CM

## 2011-09-11 MED ORDER — NITROGLYCERIN 0.4 MG SL SUBL: 0.4000 mg | SUBLINGUAL_TABLET | SUBLINGUAL | Status: DC | PRN

## 2011-09-11 MED ORDER — METOPROLOL TARTRATE 50 MG PO TABS: 50.0000 mg | ORAL_TABLET | Freq: Two times a day (BID) | ORAL | Status: DC

## 2011-09-11 NOTE — Assessment & Plan Note (Signed)
Stable. Will restart beta blocker. Continue other meds. BP well controlled. Lipids well controlled.

## 2011-09-11 NOTE — Patient Instructions (Signed)
Your physician wants you to follow-up in: 12 months.  You will receive a reminder letter in the mail two months in advance. If you don't receive a letter, please call our office to schedule the follow-up appointment.  Your physician has recommended you make the following change in your medication:  Resume metoprolol tartrate 50 mg by mouth twice daily.

## 2011-09-11 NOTE — Progress Notes (Signed)
History of Present Illness: 77 yo AAM with history of CAD, HLD, DM, HTN, CRI who is here today for cardiac follow up. He has been followed in the past by Dr. Juanda Chance and this is the first time I am meeting him. He had remote stenting of the LAD and PTCA of the circumflex artery. In 2008 he had PTCA of the mid right coronary artery. It was a difficult procedure. The vessel was heavily calcified and a stent could not be delivered to the lesion.   He tells me today that he has done well. He has had no chest pain, SOB or palpitations. Last lipids 05/22/11 with total chol 110 and LDL 50.   Past Medical History  Diagnosis Date  . Hyperlipidemia   . Diabetes mellitus   . CHF (congestive heart failure)   . Hypertension   . Myocardial infarction   . Nephropathy   . Renal insufficiency   . CAD (coronary artery disease)   . Pulmonary nodule   . Rheumatoid arthritis   . Hx of adenomatous colonic polyps     Past Surgical History  Procedure Date  . Angioplasty 1997, 2008  . Coronary stent placement 2002    coronary  . Hernia repair     Inguinal and ventral  . Colonoscopy w/ polypectomy 02/20/2011    8 polyps removed, largest 1 cm, mix of adenomas, lymphoid aggregates and hyperplastic    Current Outpatient Prescriptions  Medication Sig Dispense Refill  . acetic acid-hydrocortisone (VOSOL-HC) otic solution Place 3 drops into both ears as needed.        Marland Kitchen amLODipine (NORVASC) 10 MG tablet Take 1 tablet (10 mg total) by mouth daily.  30 tablet  5  . aspirin 81 MG tablet Take 1 tablet (81 mg total) by mouth daily. DO NOT TAKE YOUR ASPIRIN AGAIN UNTIL MAY 21      . Calcium Carbonate-Vit D-Min (CALCIUM 1200 PO) Take 1 tablet by mouth daily.        . folic acid (FOLVITE) 1 MG tablet Take 1 mg by mouth daily.        Marland Kitchen glucose blood (ONE TOUCH TEST STRIPS) test strip 1 each by Other route as needed. Use as instructed       . glyBURIDE (DIABETA) 5 MG tablet Take 5 mg by mouth daily with breakfast.         . hydrochlorothiazide 25 MG tablet Take 1 tablet (25 mg total) by mouth daily.  30 tablet  6  . insulin glargine (LANTUS) 100 UNIT/ML injection Inject into the skin as directed. Take 10 units daily      . Insulin Pen Needle (MEIJER PEN NEEDLES) 31G X 6 MM MISC by Does not apply route as directed.        Marland Kitchen lisinopril (PRINIVIL,ZESTRIL) 5 MG tablet Take 5 mg by mouth Daily.       . methotrexate (RHEUMATREX) 2.5 MG tablet Take 2.5 mg by mouth once a week. Caution:Chemotherapy. Protect from light.       . metoprolol (LOPRESSOR) 50 MG tablet Take 50 mg by mouth 2 (two) times daily.        . simvastatin (ZOCOR) 40 MG tablet Take 40 mg by mouth at bedtime.         Current Facility-Administered Medications  Medication Dose Route Frequency Provider Last Rate Last Dose  . 0.9 %  sodium chloride infusion  500 mL Intravenous Continuous Iva Boop, MD  No Known Allergies  History   Social History  . Marital Status: Married    Spouse Name: N/A    Number of Children: 4  . Years of Education: N/A   Occupational History  . Retired [employer]    Social History Main Topics  . Smoking status: Former Smoker    Quit date: 02/06/1989  . Smokeless tobacco: Former Neurosurgeon    Quit date: 02/06/2006  . Alcohol Use: No  . Drug Use: No  . Sexually Active: Not on file   Other Topics Concern  . Not on file   Social History Narrative  . No narrative on file    Family History  Problem Relation Age of Onset  . Stroke Mother   . Stroke Father     Review of Systems:  As stated in the HPI and otherwise negative.   BP 138/75  Pulse 82  Ht 6' (1.829 m)  Wt 228 lb (103.42 kg)  BMI 30.92 kg/m2  Physical Examination: General: Well developed, well nourished, NAD HEENT: OP clear, mucus membranes moist SKIN: warm, dry. No rashes. Neuro: No focal deficits Musculoskeletal: Muscle strength 5/5 all ext Psychiatric: Mood and affect normal Neck: No JVD, no carotid bruits, no thyromegaly,  no lymphadenopathy. Lungs:Clear bilaterally, no wheezes, rhonci, crackles Cardiovascular: Regular rate and rhythm. No murmurs, gallops or rubs. Abdomen:Soft. Bowel sounds present. Non-tender.  Extremities: No lower extremity edema. Pulses are 2 + in the bilateral DP/PT.  RUE:AVWUJ rhythm with 1st degree AV block. Non-specific T wave abnormality.

## 2011-09-12 ENCOUNTER — Other Ambulatory Visit: Payer: Self-pay | Admitting: Internal Medicine

## 2011-09-21 ENCOUNTER — Telehealth: Payer: Self-pay | Admitting: Speech Pathology

## 2011-09-21 NOTE — Telephone Encounter (Signed)
FYI:  Debbie from [facility] called and is requesting that patient be screened for Dementia at his next office visit, scheduled for 09/26/11.

## 2011-09-26 ENCOUNTER — Ambulatory Visit (INDEPENDENT_AMBULATORY_CARE_PROVIDER_SITE_OTHER): Payer: Medicare Other | Admitting: Internal Medicine

## 2011-09-26 ENCOUNTER — Encounter: Payer: Self-pay | Admitting: Internal Medicine

## 2011-09-26 DIAGNOSIS — I10 Essential (primary) hypertension: Secondary | ICD-10-CM

## 2011-09-26 DIAGNOSIS — N259 Disorder resulting from impaired renal tubular function, unspecified: Secondary | ICD-10-CM

## 2011-09-26 DIAGNOSIS — I2510 Atherosclerotic heart disease of native coronary artery without angina pectoris: Secondary | ICD-10-CM

## 2011-09-26 DIAGNOSIS — E1129 Type 2 diabetes mellitus with other diabetic kidney complication: Secondary | ICD-10-CM

## 2011-09-26 DIAGNOSIS — I509 Heart failure, unspecified: Secondary | ICD-10-CM

## 2011-09-26 LAB — HEPATIC FUNCTION PANEL
ALT: 14 U/L (ref 0–53)
Albumin: 3.5 g/dL (ref 3.5–5.2)
Total Bilirubin: 0.6 mg/dL (ref 0.3–1.2)
Total Protein: 7.7 g/dL (ref 6.0–8.3)

## 2011-09-26 LAB — BASIC METABOLIC PANEL
Chloride: 108 mEq/L (ref 96–112)
GFR: 48.86 mL/min — ABNORMAL LOW (ref >60.00)
Potassium: 3.8 mEq/L (ref 3.5–5.1)
Sodium: 141 mEq/L (ref 135–145)

## 2011-09-26 LAB — LIPID PANEL
Cholesterol: 89 mg/dL (ref 0–200)
LDL Cholesterol: 41 mg/dL (ref 0–99)
Triglycerides: 73 mg/dL (ref 0.0–149.0)

## 2011-09-26 LAB — HEMOGLOBIN A1C: Hgb A1c MFr Bld: 7 % — ABNORMAL HIGH (ref 4.6–6.5)

## 2011-09-26 NOTE — Assessment & Plan Note (Signed)
BP Readings from Last 3 Encounters:  09/26/11 122/64  09/11/11 138/75  06/16/11 100/58   Well controlled Continue current meds

## 2011-09-26 NOTE — Assessment & Plan Note (Signed)
No sxs Continue risk factor modification 

## 2011-09-26 NOTE — Assessment & Plan Note (Signed)
Lab Results  Component Value Date   CREATININE 1.9* 05/22/2011   Estimated Creatinine Clearance: 41.3 ml/min (by C-G formula based on Cr of 1.9).

## 2011-09-26 NOTE — Progress Notes (Signed)
Patient ID: Matthew Bean, male   DOB: [birth year], 77 y.o.   MRN: [id number]  patient comes in for followup of multiple medical problems including type 2 diabetes, hyperlipidemia, hypertension. The patient does not check blood sugar or blood pressure at home. The patetient does not follow an exercise or diet program. The patient denies any polyuria, polydipsia.  In the past the patient has gone to diabetic treatment center. The patient is tolerating medications  Without difficulty. The patient does admit to medication compliance.   RA---he no longer sees rheumatologist: "it wasn't helping".   Past Medical History  Diagnosis Date  . Hyperlipidemia   . Diabetes mellitus   . CHF (congestive heart failure)   . Hypertension   . Myocardial infarction   . Nephropathy   . Renal insufficiency   . CAD (coronary artery disease)   . Pulmonary nodule   . Rheumatoid arthritis   . Hx of adenomatous colonic polyps     History   Social History  . Marital Status: Married    Spouse Name: N/A    Number of Children: 4  . Years of Education: N/A   Occupational History  . Retired [place] [employer]    Social History Main Topics  . Smoking status: Former Smoker    Quit date: 02/06/1989  . Smokeless tobacco: Former Neurosurgeon    Quit date: 02/06/2006  . Alcohol Use: No  . Drug Use: No  . Sexually Active: Not on file   Other Topics Concern  . Not on file   Social History Narrative  . No narrative on file    Past Surgical History  Procedure Date  . Angioplasty 1997, 2008  . Coronary stent placement 2002    coronary  . Hernia repair     Inguinal and ventral  . Colonoscopy w/ polypectomy 02/20/2011    8 polyps removed, largest 1 cm, mix of adenomas, lymphoid aggregates and hyperplastic    Family History  Problem Relation Age of Onset  . Stroke Mother   . Stroke Father     No Known Allergies  Current Outpatient Prescriptions on File Prior to Visit  Medication Sig Dispense Refill  . acetic  acid-hydrocortisone (VOSOL-HC) otic solution Place 3 drops into both ears as needed.        Marland Kitchen amLODipine (NORVASC) 10 MG tablet Take 1 tablet (10 mg total) by mouth daily.  30 tablet  5  . aspirin 81 MG tablet Take 1 tablet (81 mg total) by mouth daily. DO NOT TAKE YOUR ASPIRIN AGAIN UNTIL MAY 21      . glucose blood (ONE TOUCH TEST STRIPS) test strip 1 each by Other route as needed. Use as instructed       . glyBURIDE (DIABETA) 5 MG tablet Take 5 mg by mouth daily with breakfast.        . hydrochlorothiazide 25 MG tablet Take 1 tablet (25 mg total) by mouth daily.  30 tablet  6  . insulin glargine (LANTUS) 100 UNIT/ML injection Inject into the skin as directed. Take 10 units daily      . Insulin Pen Needle (MEIJER PEN NEEDLES) 31G X 6 MM MISC by Does not apply route as directed.        Marland Kitchen lisinopril (PRINIVIL,ZESTRIL) 5 MG tablet Take 5 mg by mouth Daily.       . metoprolol (LOPRESSOR) 50 MG tablet Take 1 tablet (50 mg total) by mouth 2 (two) times daily.  60 tablet  11  .  nitroGLYCERIN (NITROSTAT) 0.4 MG SL tablet Place 1 tablet (0.4 mg total) under the tongue every 5 (five) minutes as needed for chest pain.  25 tablet  6  . simvastatin (ZOCOR) 40 MG tablet TAKE ONE TABLET BY MOUTH EVERY DAY AT BEDTIME  180 tablet  1     patient denies chest pain, shortness of breath, orthopnea. Denies lower extremity edema, abdominal pain, change in appetite, change in bowel movements. Patient denies rashes, musculoskeletal complaints. No other specific complaints in a complete review of systems.   BP 122/64  Pulse 72  Temp(Src) 98.1 F (36.7 C) (Oral)  Ht 6' (1.829 m)  Wt 230 lb (104.327 kg)  BMI 31.19 kg/m2  well-developed well-nourished male in no acute distress. HEENT exam atraumatic, normocephalic, neck supple without jugular venous distention. Chest clear to auscultation cardiac exam S1-S2 are regular 2/6 hsm. Abdominal exam overweight with bowel sounds, soft and nontender. Extremities no edema.  Neurologic exam is alert with a normal gait.

## 2011-09-26 NOTE — Assessment & Plan Note (Signed)
On ACE-I No sxs Continue same meds

## 2011-09-26 NOTE — Assessment & Plan Note (Signed)
Check labs today.

## 2011-10-13 ENCOUNTER — Ambulatory Visit (INDEPENDENT_AMBULATORY_CARE_PROVIDER_SITE_OTHER): Payer: Medicare Other | Admitting: Internal Medicine

## 2011-10-13 ENCOUNTER — Encounter: Payer: Self-pay | Admitting: Internal Medicine

## 2011-10-13 DIAGNOSIS — N259 Disorder resulting from impaired renal tubular function, unspecified: Secondary | ICD-10-CM

## 2011-10-13 DIAGNOSIS — I2510 Atherosclerotic heart disease of native coronary artery without angina pectoris: Secondary | ICD-10-CM

## 2011-10-13 DIAGNOSIS — M109 Gout, unspecified: Secondary | ICD-10-CM

## 2011-10-13 DIAGNOSIS — M069 Rheumatoid arthritis, unspecified: Secondary | ICD-10-CM

## 2011-10-13 DIAGNOSIS — I509 Heart failure, unspecified: Secondary | ICD-10-CM

## 2011-10-13 DIAGNOSIS — E1129 Type 2 diabetes mellitus with other diabetic kidney complication: Secondary | ICD-10-CM

## 2011-10-13 DIAGNOSIS — I10 Essential (primary) hypertension: Secondary | ICD-10-CM

## 2011-10-13 MED ORDER — PREDNISONE 5 MG PO TABS: 10.0000 mg | ORAL_TABLET | Freq: Every day | ORAL | Status: AC

## 2011-10-13 MED ORDER — HYDROCODONE-ACETAMINOPHEN 5-500 MG PO TABS: 1.0000 | ORAL_TABLET | ORAL | Status: DC | PRN

## 2011-10-13 NOTE — Progress Notes (Signed)
  Subjective:    Patient ID: Matthew Bean, male    DOB: 03-13-1933, 78 y.o.   MRN: [id number]  HPI  78 year old patient who presents with a four-day history of increasing left knee pain. He has a history of rheumatoid arthritis as well as gout. He is insulin requiring diabetic and also has a history of congestive heart failure. Is a difficult time ambulating due to the worsening left knee pain. Ambulation is impaired. He has seen rheumatology in the past but not recently.    Review of Systems  Musculoskeletal: Positive for joint swelling, arthralgias and gait problem.       Objective:   Physical Exam  Constitutional: He appears well-developed and well-nourished. No distress.       Blood pressure normal. Afebrile  Musculoskeletal: He exhibits no edema and no tenderness.       The left knee was  warm to touch tender with a moderate effusion          Assessment & Plan:   Inflammatory arthritis left knee. Unsure whether this represents gouty arthritis. Or acute exacerbation of rheumatoid or osteoarthritis. We'll call his orthopedics office (Dr. Kellie Simmering) and how to make an appointment for this afternoon or early next week. In view of his chronic renal insufficiency we'll treat with a modest dose prednisone. He will track his blood sugars more closely while on prednisone therapy. Was also given a prescription for an analgesic.

## 2011-10-13 NOTE — Patient Instructions (Signed)
Prednisone tablets to use twice daily  Follow up with rheumatology next week  Take pain medications as directed  Check blood sugars more closely until off prednisone

## 2011-10-18 ENCOUNTER — Ambulatory Visit (HOSPITAL_COMMUNITY)
Admission: RE | Admit: 2011-10-18 | Discharge: 2011-10-18 | Disposition: A | Discharge status: Home / Self Care | Payer: Medicare Other | Source: Ambulatory Visit | Attending: Rheumatology | Admitting: Rheumatology

## 2011-10-18 DIAGNOSIS — M79609 Pain in unspecified limb: Secondary | ICD-10-CM

## 2011-10-18 DIAGNOSIS — M7989 Other specified soft tissue disorders: Secondary | ICD-10-CM

## 2011-10-18 DIAGNOSIS — M25569 Pain in unspecified knee: Secondary | ICD-10-CM | POA: Insufficient documentation

## 2011-10-18 DIAGNOSIS — M7120 Synovial cyst of popliteal space [Baker], unspecified knee: Secondary | ICD-10-CM | POA: Insufficient documentation

## 2011-10-18 NOTE — Progress Notes (Signed)
*  PRELIMINARY RESULTS*  Left LEV  has been performed.  Left:  No evidence of DVT, superficial thrombosis.   Baker's cyst noted in popliteal fossa..  Report called to [facility] in office. Vanna Scotland 10/18/2011, 11:27 AM

## 2011-11-07 ENCOUNTER — Other Ambulatory Visit: Payer: Self-pay | Admitting: Internal Medicine

## 2011-12-25 ENCOUNTER — Other Ambulatory Visit: Payer: Self-pay | Admitting: Cardiovascular Disease

## 2012-01-02 ENCOUNTER — Other Ambulatory Visit: Payer: Self-pay | Admitting: Internal Medicine

## 2012-01-11 ENCOUNTER — Other Ambulatory Visit: Payer: Self-pay | Admitting: Internal Medicine

## 2012-02-06 ENCOUNTER — Encounter: Payer: Self-pay | Admitting: Family

## 2012-02-06 ENCOUNTER — Ambulatory Visit (INDEPENDENT_AMBULATORY_CARE_PROVIDER_SITE_OTHER): Payer: Medicare Other | Admitting: Family

## 2012-02-06 VITALS — BP 148/86 | HR 66 | Temp 98.3°F | Wt 237.0 lb

## 2012-02-06 DIAGNOSIS — J40 Bronchitis, not specified as acute or chronic: Secondary | ICD-10-CM

## 2012-02-06 DIAGNOSIS — J209 Acute bronchitis, unspecified: Secondary | ICD-10-CM

## 2012-02-06 DIAGNOSIS — R062 Wheezing: Secondary | ICD-10-CM

## 2012-02-06 MED ORDER — ALBUTEROL SULFATE (5 MG/ML) 0.5% IN NEBU: 2.5000 mg | INHALATION_SOLUTION | RESPIRATORY_TRACT | Status: DC

## 2012-02-06 MED ORDER — PREDNISONE 20 MG PO TABS: ORAL_TABLET | ORAL | Status: AC

## 2012-02-06 NOTE — Patient Instructions (Signed)
Bronchospasm  A bronchospasm is when the tubes that carry air in and out of your lungs (bronchioles) become smaller. It is hard to breathe when this happens. A bronchospasm can be caused by:   Asthma.   Allergies.   Lung infection.  HOME CARE    Do not  smoke. Avoid places that have secondhand smoke.   Dust your house often. Have your air ducts cleaned once or twice a year.   Find out what allergies may cause your bronchospasms.   Use your inhaler properly if you have one. Know when to use it.   Eat healthy foods and drink plenty of water.   Only take medicine as told by your doctor.  GET HELP RIGHT AWAY IF:   You feel you cannot breathe or catch your breath.   You cannot stop coughing.   Your treatment is not helping you breathe better.  MAKE SURE YOU:    Understand these instructions.   Will watch your condition.   Will get help right away if you are not doing well or get worse.  Document Released: 07/23/2009 Document Revised: 09/14/2011 Document Reviewed: 07/23/2009  [employer] Patient Information 2012 [employer].

## 2012-02-06 NOTE — Progress Notes (Signed)
Subjective:    Patient ID: Matthew Bean, male    DOB: 1933-06-21, 78 y.o.   MRN: [id number]  HPI 78 year old [place] American male, patient of Dr. Cato Mulligan is in today with one week of cough, congestion, wheezing with no improvement. Cough is productive with yellow phlegm. He is a nonsmoker. He has tried NyQuil with no relief. He denies any lightheadedness, dizziness, chest pain, palpitations, shortness of breath or edema   Review of Systems  Constitutional: Negative.   HENT: Negative.   Eyes: Negative.   Respiratory: Positive for cough and wheezing.   Cardiovascular: Negative.   Gastrointestinal: Negative.   Musculoskeletal: Negative.   Skin: Negative.   Neurological: Negative.   Hematological: Negative.   Psychiatric/Behavioral: Negative.    Past Medical History  Diagnosis Date  . Hyperlipidemia   . Diabetes mellitus   . CHF (congestive heart failure)   . Hypertension   . Myocardial infarction   . Nephropathy   . Renal insufficiency   . CAD (coronary artery disease)   . Pulmonary nodule   . Rheumatoid arthritis   . Hx of adenomatous colonic polyps     History   Social History  . Marital Status: Married    Spouse Name: N/A    Number of Children: 4  . Years of Education: N/A   Occupational History  . Retired [place] [employer]    Social History Main Topics  . Smoking status: Former Smoker    Quit date: 02/06/1989  . Smokeless tobacco: Former Neurosurgeon    Quit date: 02/06/2006  . Alcohol Use: No  . Drug Use: No  . Sexually Active: Not on file   Other Topics Concern  . Not on file   Social History Narrative  . No narrative on file    Past Surgical History  Procedure Date  . Angioplasty 1997, 2008  . Coronary stent placement 2002    coronary  . Hernia repair     Inguinal and ventral  . Colonoscopy w/ polypectomy 02/20/2011    8 polyps removed, largest 1 cm, mix of adenomas, lymphoid aggregates and hyperplastic    Family History  Problem Relation Age of  Onset  . Stroke Mother   . Stroke Father     No Known Allergies  Current Outpatient Prescriptions on File Prior to Visit  Medication Sig Dispense Refill  . acetic acid-hydrocortisone (VOSOL-HC) otic solution Place 3 drops into both ears as needed.        Marland Kitchen amLODipine (NORVASC) 10 MG tablet TAKE ONE TABLET BY MOUTH EVERY DAY  30 tablet  6  . aspirin 81 MG tablet Take 1 tablet (81 mg total) by mouth daily. DO NOT TAKE YOUR ASPIRIN AGAIN UNTIL MAY 21      . glucose blood (ONE TOUCH TEST STRIPS) test strip 1 each by Other route as needed. Use as instructed       . glyBURIDE (DIABETA) 5 MG tablet TAKE ONE TABLET BY MOUTH TWICE DAILY WITH MEALS  180 tablet  1  . hydrochlorothiazide 25 MG tablet Take 1 tablet (25 mg total) by mouth daily.  30 tablet  6  . LANTUS SOLOSTAR 100 UNIT/ML injection USE AS DIRECTED  45 mL  2  . metoprolol (LOPRESSOR) 50 MG tablet Take 1 tablet (50 mg total) by mouth 2 (two) times daily.  60 tablet  11  . nitroGLYCERIN (NITROSTAT) 0.4 MG SL tablet Place 1 tablet (0.4 mg total) under the tongue every 5 (five) minutes as needed  for chest pain.  25 tablet  6  . RELION MINI PEN NEEDLES 31G X 6 MM MISC USE EVERY DAY  100 each  0  . simvastatin (ZOCOR) 40 MG tablet TAKE ONE TABLET BY MOUTH EVERY DAY AT BEDTIME  180 tablet  1  . HYDROcodone-acetaminophen (VICODIN) 5-500 MG per tablet Take 1 tablet by mouth every 4 (four) hours as needed for pain.  20 tablet  0    BP 148/86  Pulse 66  Temp(Src) 98.3 F (36.8 C) (Oral)  Wt 237 lb (107.502 kg)  SpO2 98%chart    Objective:   Physical Exam  Constitutional: He is oriented to person, place, and time. He appears well-developed and well-nourished.  HENT:  Right Ear: External ear normal.  Left Ear: External ear normal.  Nose: Nose normal.  Mouth/Throat: Oropharynx is clear and moist.  Neck: Normal range of motion. Neck supple.  Cardiovascular: Normal rate, regular rhythm and normal heart sounds.   Pulmonary/Chest: Effort  normal. He has wheezes.  Musculoskeletal: Normal range of motion.  Neurological: He is alert and oriented to person, place, and time.  Skin: Skin is warm and dry.  Psychiatric: He has a normal mood and affect.      Albuterol neb given    Assessment & Plan:  Assessment: Cough, bronchitis, wheezing  Plan: Prednisone 60x3, 40x3, 20x3. Resume normal prednisone dose thereafter. Patient call the office if his symptoms worsen or persist, recheck as scheduled and when necessary.

## 2012-02-12 ENCOUNTER — Other Ambulatory Visit: Payer: Self-pay

## 2012-02-12 MED ORDER — HYDROCHLOROTHIAZIDE 25 MG PO TABS: 25.0000 mg | ORAL_TABLET | Freq: Every day | ORAL | Status: DC

## 2012-03-26 ENCOUNTER — Ambulatory Visit (INDEPENDENT_AMBULATORY_CARE_PROVIDER_SITE_OTHER): Payer: Medicare Other | Admitting: Internal Medicine

## 2012-03-26 ENCOUNTER — Encounter: Payer: Self-pay | Admitting: Internal Medicine

## 2012-03-26 VITALS — BP 130/70 | HR 52 | Temp 98.2°F | Wt 226.0 lb

## 2012-03-26 DIAGNOSIS — D649 Anemia, unspecified: Secondary | ICD-10-CM

## 2012-03-26 DIAGNOSIS — E1129 Type 2 diabetes mellitus with other diabetic kidney complication: Secondary | ICD-10-CM

## 2012-03-26 DIAGNOSIS — Z79899 Other long term (current) drug therapy: Secondary | ICD-10-CM

## 2012-03-26 DIAGNOSIS — I2510 Atherosclerotic heart disease of native coronary artery without angina pectoris: Secondary | ICD-10-CM

## 2012-03-26 DIAGNOSIS — N058 Unspecified nephritic syndrome with other morphologic changes: Secondary | ICD-10-CM

## 2012-03-26 DIAGNOSIS — I10 Essential (primary) hypertension: Secondary | ICD-10-CM

## 2012-03-26 DIAGNOSIS — E785 Hyperlipidemia, unspecified: Secondary | ICD-10-CM

## 2012-03-26 LAB — BASIC METABOLIC PANEL
BUN: 27 mg/dL — ABNORMAL HIGH (ref 6–23)
Chloride: 105 mEq/L (ref 96–112)
Glucose, Bld: 164 mg/dL — ABNORMAL HIGH (ref 70–99)
Potassium: 4.1 mEq/L (ref 3.5–5.1)

## 2012-03-26 LAB — HEMOGLOBIN A1C: Hgb A1c MFr Bld: 9.1 % — ABNORMAL HIGH (ref 4.6–6.5)

## 2012-03-26 LAB — HEPATIC FUNCTION PANEL
ALT: 37 U/L (ref 0–53)
AST: 31 U/L (ref 0–37)
Albumin: 3.8 g/dL (ref 3.5–5.2)

## 2012-03-26 LAB — LIPID PANEL
HDL: 48.9 mg/dL (ref >39.00)
LDL Cholesterol: 47 mg/dL (ref 0–99)
Total CHOL/HDL Ratio: 2
Triglycerides: 119 mg/dL (ref 0.0–149.0)

## 2012-03-26 NOTE — Assessment & Plan Note (Signed)
CBC:    Component Value Date/Time   WBC 8.8 05/22/2011 1043   HGB 13.2 05/22/2011 1043   HCT 40.5 05/22/2011 1043   PLT 178.0 05/22/2011 1043   MCV 95.0 05/22/2011 1043   NEUTROABS 6.0 05/22/2011 1043   LYMPHSABS 1.4 05/22/2011 1043   MONOABS 0.8 05/22/2011 1043   EOSABS 0.5 05/22/2011 1043   BASOSABS 0.1 05/22/2011 1043    Resolved- will remove from problem list

## 2012-03-26 NOTE — Assessment & Plan Note (Signed)
No sxs, tolerating meds Continue risk factor modification

## 2012-03-26 NOTE — Assessment & Plan Note (Signed)
Check labs today.

## 2012-03-26 NOTE — Assessment & Plan Note (Signed)
BP Readings from Last 3 Encounters:  03/26/12 130/70  02/06/12 148/86  10/13/11 120/86    Adequate control Continue same meds

## 2012-03-26 NOTE — Progress Notes (Signed)
Patient ID: Matthew Bean, male   DOB: 1933-03-19, 78 y.o.   MRN: [id number]   patient comes in for followup of multiple medical problems including type 2 diabetes, hyperlipidemia, hypertension. The patient does not check blood sugar or blood pressure at home. The patetient does not follow an exercise or diet program. The patient denies any polyuria, polydipsia.  In the past the patient has gone to diabetic treatment center. The patient is tolerating medications  Without difficulty. The patient does admit to medication compliance.   He ahs regular f/u with rheumatologist for RA- tolerating meds  Past Medical History  Diagnosis Date  . Hyperlipidemia   . Diabetes mellitus   . CHF (congestive heart failure)   . Hypertension   . Myocardial infarction   . Nephropathy   . Renal insufficiency   . CAD (coronary artery disease)   . Pulmonary nodule   . Rheumatoid arthritis   . Hx of adenomatous colonic polyps     History   Social History  . Marital Status: Married    Spouse Name: N/A    Number of Children: 4  . Years of Education: N/A   Occupational History  . Retired [employer]    Social History Main Topics  . Smoking status: Former Smoker    Quit date: 02/06/1989  . Smokeless tobacco: Former Neurosurgeon    Quit date: 02/06/2006  . Alcohol Use: No  . Drug Use: No  . Sexually Active: Not on file   Other Topics Concern  . Not on file   Social History Narrative  . No narrative on file    Past Surgical History  Procedure Date  . Angioplasty 1997, 2008  . Coronary stent placement 2002    coronary  . Hernia repair     Inguinal and ventral  . Colonoscopy w/ polypectomy 02/20/2011    8 polyps removed, largest 1 cm, mix of adenomas, lymphoid aggregates and hyperplastic    Family History  Problem Relation Age of Onset  . Stroke Mother   . Stroke Father     No Known Allergies  Current Outpatient Prescriptions on File Prior to Visit  Medication Sig Dispense Refill  .  acetic acid-hydrocortisone (VOSOL-HC) otic solution Place 3 drops into both ears as needed.        Marland Kitchen amLODipine (NORVASC) 10 MG tablet TAKE ONE TABLET BY MOUTH EVERY DAY  30 tablet  6  . aspirin 81 MG tablet Take 1 tablet (81 mg total) by mouth daily. DO NOT TAKE YOUR ASPIRIN AGAIN UNTIL MAY 21      . folic acid (FOLVITE) 1 MG tablet Take 1 mg by mouth daily.      Marland Kitchen glucose blood (ONE TOUCH TEST STRIPS) test strip 1 each by Other route as needed. Use as instructed       . glyBURIDE (DIABETA) 5 MG tablet TAKE ONE TABLET BY MOUTH TWICE DAILY WITH MEALS  180 tablet  1  . HYDROcodone-acetaminophen (VICODIN) 5-500 MG per tablet Take 1 tablet by mouth every 4 (four) hours as needed for pain.  20 tablet  0  . LANTUS SOLOSTAR 100 UNIT/ML injection USE AS DIRECTED  45 mL  2  . metoprolol (LOPRESSOR) 50 MG tablet Take 1 tablet (50 mg total) by mouth 2 (two) times daily.  60 tablet  11  . nitroGLYCERIN (NITROSTAT) 0.4 MG SL tablet Place 1 tablet (0.4 mg total) under the tongue every 5 (five) minutes as needed for chest pain.  25 tablet  6  . RELION MINI PEN NEEDLES 31G X 6 MM MISC USE EVERY DAY  100 each  0  . simvastatin (ZOCOR) 40 MG tablet TAKE ONE TABLET BY MOUTH EVERY DAY AT BEDTIME  180 tablet  1   Current Facility-Administered Medications on File Prior to Visit  Medication Dose Route Frequency Provider Last Rate Last Dose  . DISCONTD: albuterol (PROVENTIL) (5 MG/ML) 0.5% nebulizer solution 2.5 mg  2.5 mg Nebulization Q4H Baker Pierini, FNP         patient denies chest pain, shortness of breath, orthopnea. Denies lower extremity edema, abdominal pain, change in appetite, change in bowel movements. Patient denies rashes, musculoskeletal complaints. No other specific complaints in a complete review of systems.   BP 130/70  Pulse 52  Temp 98.2 F (36.8 C) (Oral)  Wt 226 lb (102.513 kg)  well-developed well-nourished male in no acute distress. HEENT exam atraumatic, normocephalic, neck supple  without jugular venous distention. Chest clear to auscultation cardiac exam S1-S2 are regular. Abdominal exam overweight with bowel sounds, soft and nontender. Extremities no edema. Neurologic exam is alert with a normal gait.

## 2012-03-26 NOTE — Assessment & Plan Note (Signed)
Lab Results  Component Value Date   HGBA1C 7.0* 09/26/2011    Previously controlled- check labs today

## 2012-03-29 ENCOUNTER — Telehealth: Payer: Self-pay | Admitting: *Deleted

## 2012-03-29 NOTE — Telephone Encounter (Signed)
Message copied by Regis Bill on Fri Mar 29, 2012  9:07 AM ------      Message from: Lindley Magnus      Created: Fri Mar 29, 2012  7:27 AM       Call patient. Laboratory work okay except that diabetes is not controlled. Please document how much Lantus he is taking. We will call him back with a different dose.

## 2012-03-29 NOTE — Telephone Encounter (Signed)
Patient informed; he is currently taking 10 units in the evening, please advise/SLS

## 2012-04-01 NOTE — Telephone Encounter (Signed)
Pt aware, updated med list

## 2012-04-01 NOTE — Telephone Encounter (Signed)
Have him increase to 15 units qday.

## 2012-05-13 ENCOUNTER — Other Ambulatory Visit: Payer: Self-pay | Admitting: Internal Medicine

## 2012-06-02 ENCOUNTER — Emergency Department (HOSPITAL_COMMUNITY): Payer: Medicare Other

## 2012-06-02 ENCOUNTER — Encounter (HOSPITAL_COMMUNITY): Payer: Self-pay | Admitting: Emergency Medicine

## 2012-06-02 ENCOUNTER — Observation Stay (HOSPITAL_COMMUNITY)
Admission: EM | Admit: 2012-06-02 | Discharge: 2012-06-03 | Disposition: A | Discharge status: Home / Self Care | Payer: Medicare Other | Attending: Internal Medicine | Admitting: Internal Medicine

## 2012-06-02 DIAGNOSIS — I129 Hypertensive chronic kidney disease with stage 1 through stage 4 chronic kidney disease, or unspecified chronic kidney disease: Secondary | ICD-10-CM | POA: Insufficient documentation

## 2012-06-02 DIAGNOSIS — E785 Hyperlipidemia, unspecified: Secondary | ICD-10-CM | POA: Diagnosis present

## 2012-06-02 DIAGNOSIS — R079 Chest pain, unspecified: Secondary | ICD-10-CM | POA: Insufficient documentation

## 2012-06-02 DIAGNOSIS — R29898 Other symptoms and signs involving the musculoskeletal system: Secondary | ICD-10-CM | POA: Insufficient documentation

## 2012-06-02 DIAGNOSIS — M069 Rheumatoid arthritis, unspecified: Secondary | ICD-10-CM | POA: Diagnosis present

## 2012-06-02 DIAGNOSIS — E1129 Type 2 diabetes mellitus with other diabetic kidney complication: Secondary | ICD-10-CM

## 2012-06-02 DIAGNOSIS — I252 Old myocardial infarction: Secondary | ICD-10-CM

## 2012-06-02 DIAGNOSIS — Z794 Long term (current) use of insulin: Secondary | ICD-10-CM | POA: Insufficient documentation

## 2012-06-02 DIAGNOSIS — M109 Gout, unspecified: Secondary | ICD-10-CM | POA: Diagnosis present

## 2012-06-02 DIAGNOSIS — I5022 Chronic systolic (congestive) heart failure: Secondary | ICD-10-CM | POA: Insufficient documentation

## 2012-06-02 DIAGNOSIS — I10 Essential (primary) hypertension: Secondary | ICD-10-CM | POA: Diagnosis present

## 2012-06-02 DIAGNOSIS — M25569 Pain in unspecified knee: Secondary | ICD-10-CM | POA: Insufficient documentation

## 2012-06-02 DIAGNOSIS — R531 Weakness: Secondary | ICD-10-CM | POA: Diagnosis present

## 2012-06-02 DIAGNOSIS — I2510 Atherosclerotic heart disease of native coronary artery without angina pectoris: Secondary | ICD-10-CM | POA: Diagnosis present

## 2012-06-02 DIAGNOSIS — Z7902 Long term (current) use of antithrombotics/antiplatelets: Secondary | ICD-10-CM | POA: Insufficient documentation

## 2012-06-02 DIAGNOSIS — Z86010 Personal history of colonic polyps: Secondary | ICD-10-CM

## 2012-06-02 DIAGNOSIS — N183 Chronic kidney disease, stage 3 unspecified: Secondary | ICD-10-CM | POA: Insufficient documentation

## 2012-06-02 DIAGNOSIS — I509 Heart failure, unspecified: Secondary | ICD-10-CM | POA: Diagnosis present

## 2012-06-02 DIAGNOSIS — N259 Disorder resulting from impaired renal tubular function, unspecified: Secondary | ICD-10-CM | POA: Diagnosis present

## 2012-06-02 DIAGNOSIS — R262 Difficulty in walking, not elsewhere classified: Secondary | ICD-10-CM | POA: Insufficient documentation

## 2012-06-02 DIAGNOSIS — IMO0002 Reserved for concepts with insufficient information to code with codable children: Secondary | ICD-10-CM | POA: Diagnosis present

## 2012-06-02 DIAGNOSIS — E118 Type 2 diabetes mellitus with unspecified complications: Secondary | ICD-10-CM

## 2012-06-02 DIAGNOSIS — M6281 Muscle weakness (generalized): Principal | ICD-10-CM | POA: Insufficient documentation

## 2012-06-02 LAB — URINALYSIS, ROUTINE W REFLEX MICROSCOPIC
Bilirubin Urine: NEGATIVE
Ketones, ur: NEGATIVE mg/dL
Nitrite: NEGATIVE
Urobilinogen, UA: 1 mg/dL (ref 0.0–1.0)

## 2012-06-02 LAB — CK TOTAL AND CKMB (NOT AT ARMC): Relative Index: 1.2 (ref 0.0–2.5)

## 2012-06-02 LAB — GLUCOSE, CAPILLARY
Glucose-Capillary: 161 mg/dL — ABNORMAL HIGH (ref 70–99)
Glucose-Capillary: 107 mg/dL — ABNORMAL HIGH (ref 70–99)

## 2012-06-02 LAB — POCT I-STAT, CHEM 8
BUN: 31 mg/dL — ABNORMAL HIGH (ref 6–23)
Calcium, Ion: 1.2 mmol/L (ref 1.13–1.30)
Chloride: 106 meq/L (ref 96–112)
Creatinine, Ser: 1.8 mg/dL — ABNORMAL HIGH (ref 0.50–1.35)
Glucose, Bld: 201 mg/dL — ABNORMAL HIGH (ref 70–99)
HCT: 43 % (ref 39.0–52.0)
Hemoglobin: 14.6 g/dL (ref 13.0–17.0)
Potassium: 4.1 meq/L (ref 3.5–5.1)
Sodium: 140 meq/L (ref 135–145)
TCO2: 24 mmol/L (ref 0–100)

## 2012-06-02 LAB — PRO B NATRIURETIC PEPTIDE: Pro B Natriuretic peptide (BNP): 1399 pg/mL — ABNORMAL HIGH (ref 0–450)

## 2012-06-02 LAB — TROPONIN I: Troponin I: <0.3 ng/mL (ref <0.30)

## 2012-06-02 MED ORDER — OXYCODONE-ACETAMINOPHEN 5-325 MG PO TABS
2.0000 | ORAL_TABLET | Freq: Once | ORAL | Status: AC
  Administered 2012-06-02: 2 via ORAL
  Filled 2012-06-02: qty 2

## 2012-06-02 MED ORDER — FUROSEMIDE 10 MG/ML IJ SOLN: 40.0000 mg | Freq: Once | INTRAMUSCULAR | Status: DC

## 2012-06-02 NOTE — ED Notes (Signed)
Old and New EKG given to Dr Yao 

## 2012-06-02 NOTE — ED Provider Notes (Addendum)
History     CSN: [id number]  Arrival date & time 06/02/12  1552   First MD Initiated Contact with Patient 06/02/12 2126      Chief Complaint  Patient presents with  . Weakness    (Consider location/radiation/quality/duration/timing/severity/associated sxs/prior treatment) The history is provided by the patient and a relative.  Matthew Bean is a 78 y.o. male hx of HTN, MI s/p stents, arthritis, DM, HL here with weakness. Was doing a lot of work and was on his feet constantly yesterday. Today, felt weak and unable to get up by himself. He said that both of his knees hurt him. His legs are also more swollen as well. No CP or SOB. No weakness in hands. He also felt dizzy but didn't passed out. He has hx of CHF with diastolic dysfunction and EF 40%.    Past Medical History  Diagnosis Date  . Hyperlipidemia   . Diabetes mellitus   . CHF (congestive heart failure)   . Hypertension   . Myocardial infarction   . Nephropathy   . Renal insufficiency   . CAD (coronary artery disease)   . Pulmonary nodule   . Rheumatoid arthritis   . Hx of adenomatous colonic polyps     Past Surgical History  Procedure Date  . Angioplasty 1997, 2008  . Coronary stent placement 2002    coronary  . Hernia repair     Inguinal and ventral  . Colonoscopy w/ polypectomy 02/20/2011    8 polyps removed, largest 1 cm, mix of adenomas, lymphoid aggregates and hyperplastic    Family History  Problem Relation Age of Onset  . Stroke Mother   . Stroke Father     History  Substance Use Topics  . Smoking status: Former Smoker    Quit date: 02/06/1989  . Smokeless tobacco: Former Neurosurgeon    Quit date: 02/06/2006  . Alcohol Use: No      Review of Systems  Musculoskeletal: Positive for gait problem.  Neurological: Positive for weakness.  All other systems reviewed and are negative.    Allergies  Review of patient's allergies indicates no known allergies.  Home Medications   Current Outpatient Rx   Name Route Sig Dispense Refill  . HYDROCORTISONE-ACETIC ACID 1-2 % OT SOLN Both Ears Place 3 drops into both ears as needed.      Marland Kitchen AMLODIPINE BESYLATE 10 MG PO TABS Oral Take 10 mg by mouth daily.    . ASPIRIN 81 MG PO CHEW Oral Chew 81 mg by mouth daily.    Marland Kitchen FOLIC ACID 1 MG PO TABS Oral Take 1 mg by mouth daily.    Marland Kitchen GLUCOSE BLOOD VI STRP Other 1 each by Other route as needed. Use as instructed     . GLYBURIDE 5 MG PO TABS Oral Take 5 mg by mouth 2 (two) times daily with a meal.    . HYDROCHLOROTHIAZIDE 25 MG PO TABS Oral Take 1 tablet by mouth daily.    . INSULIN GLARGINE 100 UNIT/ML [place] SOLN Subcutaneous Inject 12 Units into the skin at bedtime.     . METHOTREXATE 2.5 MG PO TABS Oral Take 4 tablets by mouth 2 (two) times a week.    Marland Kitchen METOPROLOL TARTRATE 50 MG PO TABS Oral Take 50 mg by mouth 2 (two) times daily.    Marland Kitchen NITROGLYCERIN 0.4 MG SL SUBL Sublingual Place 0.4 mg under the tongue every 5 (five) minutes as needed. For chest pain    . PREDNISONE (  PAK) 5 MG PO TABS Oral Take 5 mg by mouth daily.    Marland Kitchen SIMVASTATIN 40 MG PO TABS Oral Take 40 mg by mouth every evening.      BP 134/102  Pulse 78  Temp 98.7 F (37.1 C) (Oral)  Resp 19  SpO2 99%  Physical Exam  Nursing note and vitals reviewed. Constitutional: He is oriented to person, place, and time.       Comfortable   HENT:  Head: Normocephalic and atraumatic.  Mouth/Throat: Oropharynx is clear and moist.  Eyes: Conjunctivae and EOM are normal. Pupils are equal, round, and reactive to light.  Neck: Normal range of motion. Neck supple.  Cardiovascular: Normal rate, regular rhythm and normal heart sounds.   Pulmonary/Chest: Effort normal.       Mild crackles bilateral bases.   Abdominal: Soft. Bowel sounds are normal.  Musculoskeletal: Normal range of motion.       Bilateral knees swollen, mild tenderness. Dec ROM from pain. Otherwise strength and sensation nl. Good pulses. + trace edema bilaterally.   Neurological: He is  alert and oriented to person, place, and time.  Skin: Skin is warm and dry.  Psychiatric: He has a normal mood and affect. His behavior is normal. Judgment and thought content normal.    ED Course  Procedures (including critical care time)  Labs Reviewed  URINALYSIS, ROUTINE W REFLEX MICROSCOPIC - Abnormal; Notable for the following:    Hgb urine dipstick MODERATE (*)     Protein, ur 100 (*)     All other components within normal limits  POCT I-STAT, CHEM 8 - Abnormal; Notable for the following:    BUN 31 (*)     Creatinine, Ser 1.80 (*)     Glucose, Bld 201 (*)     All other components within normal limits  GLUCOSE, CAPILLARY - Abnormal; Notable for the following:    Glucose-Capillary 161 (*)     All other components within normal limits  CK TOTAL AND CKMB - Abnormal; Notable for the following:    Total CK 331 (*)     All other components within normal limits  PRO B NATRIURETIC PEPTIDE - Abnormal; Notable for the following:    Pro B Natriuretic peptide (BNP) 1399.0 (*)     All other components within normal limits  GLUCOSE, CAPILLARY - Abnormal; Notable for the following:    Glucose-Capillary 107 (*)     All other components within normal limits  URINE MICROSCOPIC-ADD ON  TROPONIN I   Dg Chest 2 View  06/02/2012  *RADIOLOGY REPORT*  Clinical Data: Generalized weakness.  CHEST - 2 VIEW  Comparison: PA and lateral chest 06/16/2011.  Findings: Lungs are clear.  Heart size is normal.  No pneumothorax or pleural fluid.  Degenerative change of the thoracic spine and about the shoulders noted.  IMPRESSION: No acute abnormality.   Original Report Authenticated By: Bernadene Bell. D'ALESSIO, M.D.    Dg Pelvis 1-2 Views  06/02/2012  *RADIOLOGY REPORT*  Clinical Data: Weakness.  PELVIS - 1-2 VIEW  Comparison: None.  Findings: There is no acute bony or joint abnormality.  Mild to moderate bilateral hip degenerative change is noted.  Advanced appearing lumbar spondylosis is seen.  IMPRESSION: No  acute finding.  Bilateral hip degenerative disease and lower lumbar spondylosis noted.   Original Report Authenticated By: Bernadene Bell. Maricela Curet, M.D.    Ct Head Wo Contrast  06/02/2012  *RADIOLOGY REPORT*  Clinical Data: Leg weakness and generalized weakness  CT  HEAD WITHOUT CONTRAST  Technique:  Contiguous axial images were obtained from the base of the skull through the vertex without contrast.  Comparison: None.  Findings: Prominence of the sulci, cisterns, and ventricles, in keeping with volume loss. There are subcortical and periventricular white matter hypodensities, a nonspecific finding most often seen with chronic microangiopathic changes.  There is no evidence for acute hemorrhage, overt hydrocephalus, mass lesion.  No definite CT evidence for acute cortical based (large artery) infarction.  More confluent hypodensity within the right frontal parietal lobe may reflect sequelae of prior infarction.  Focal hypodensity within the right cerebellum may correspond to a prior lacunar infarction.  Prominent extra-axial spaces anteriorly measure CSF attenuation when accounting for streak artifact.  No hyperdense component.  The visualized paranasal sinuses and mastoid air cells are predominately clear.  IMPRESSION: White matter changes and volume loss.  Question remote infarction involving the right frontal parietal lobe and lacunar infarction within the right cerebellum.  No definite CT evidence of acute intracranial abnormality.  Prominent extra-axial spaces anteriorly are favored to be secondary to volume loss.  It is not possible to entirely exclude an isodense subdural collection in this setting though this is not favored.   Original Report Authenticated By: Waneta Martins, M.D.    Dg Knee Complete 4 Views Left  06/02/2012  *RADIOLOGY REPORT*  Clinical Data: Knee pain, generalized weakness.  LEFT KNEE - COMPLETE 4+ VIEW  Comparison: 03/15/2009  Findings: Mild tricompartmental degenerative changes,  most pronounced medially.  Atherosclerotic vascular calcifications.  No acute fracture or dislocation identified. No large joint effusion.  IMPRESSION: Degenerative changes.  No acute osseous finding.   Original Report Authenticated By: Waneta Martins, M.D.    Dg Knee Complete 4 Views Right  06/02/2012  *RADIOLOGY REPORT*  Clinical Data: Weakness.  Pain.  RIGHT KNEE - COMPLETE 4+ VIEW  Comparison: Plain films 05/12/2008.  Findings: There is no acute bony or joint abnormality. Degenerative disease most notable about the medial compartment is unchanged.  There is no joint effusion.  Atherosclerosis noted.  IMPRESSION: No acute finding.  Stable compared to prior exam.   Original Report Authenticated By: Bernadene Bell. Maricela Curet, M.D.      No diagnosis found.   Date: 06/02/2012  Rate: 69  Rhythm: normal sinus rhythm  QRS Axis: normal  Intervals: normal  ST/T Wave abnormalities: T wave inversion II, III, avF, V4-V6  Conduction Disutrbances:none  Narrative Interpretation:   Old EKG Reviewed: unchanged    MDM  CORNELIOUS BARTOLUCCI is a 78 y.o. male hx of DM, HTN, MI here with weakness. He is likely weak from pain in his knees. Will get some xrays and give him pain meds. Will also consider electrolyte abnormalities. Will check EKG, trop x 1 given that he is dizzy. Finally, will consider stroke and will order CT head.   12:08 AM Labs showed BNP 1399. xrays showed no fracture. Trop neg x 1. CT head showed chronic changes. Patient still weak. Will admit for CHF exacerbation. Discussed with [facility] hospitalist, who request CBC (he will follow it up), and hold off on the lasix.         Richardean Canal, MD 06/03/12 4540  Richardean Canal, MD 06/03/12 [phone]

## 2012-06-02 NOTE — ED Notes (Signed)
Patient requesting something to eat (wife brought food); advised patient that he should wait until the doctor sees him.  Updated patient and family member on wait time; apologized about delay.  Family member verbalized understanding.  Patient denies any changes in condition; will continue to monitor.

## 2012-06-02 NOTE — ED Notes (Signed)
CBG checked 107 

## 2012-06-02 NOTE — ED Notes (Signed)
C/o generalized weakness since waking up this morning.  Denies pain or any other symptoms.

## 2012-06-02 NOTE — ED Notes (Signed)
Pt given a happy meal and a diet coke ok per RN Woodroe Chen

## 2012-06-02 NOTE — ED Notes (Signed)
Informed patient of wait time; apologized about delay.  Patient verbalized understanding; denies changes in condition; will continue to monitor.

## 2012-06-03 ENCOUNTER — Telehealth: Payer: Self-pay | Admitting: Internal Medicine

## 2012-06-03 ENCOUNTER — Encounter (HOSPITAL_COMMUNITY): Payer: Self-pay | Admitting: Internal Medicine

## 2012-06-03 DIAGNOSIS — E1129 Type 2 diabetes mellitus with other diabetic kidney complication: Secondary | ICD-10-CM

## 2012-06-03 DIAGNOSIS — R531 Weakness: Secondary | ICD-10-CM | POA: Diagnosis present

## 2012-06-03 DIAGNOSIS — I509 Heart failure, unspecified: Secondary | ICD-10-CM

## 2012-06-03 DIAGNOSIS — M069 Rheumatoid arthritis, unspecified: Secondary | ICD-10-CM

## 2012-06-03 DIAGNOSIS — N058 Unspecified nephritic syndrome with other morphologic changes: Secondary | ICD-10-CM

## 2012-06-03 DIAGNOSIS — E785 Hyperlipidemia, unspecified: Secondary | ICD-10-CM

## 2012-06-03 DIAGNOSIS — R5381 Other malaise: Secondary | ICD-10-CM

## 2012-06-03 DIAGNOSIS — E118 Type 2 diabetes mellitus with unspecified complications: Secondary | ICD-10-CM

## 2012-06-03 LAB — CBC
HCT: 37 % — ABNORMAL LOW (ref 39.0–52.0)
MCH: 29.9 pg (ref 26.0–34.0)
MCV: 94.6 fL (ref 78.0–100.0)
Platelets: 201 10*3/uL (ref 150–400)
RDW: 13.2 % (ref 11.5–15.5)

## 2012-06-03 LAB — CBC WITH DIFFERENTIAL/PLATELET
Eosinophils Absolute: 0.3 10*3/uL (ref 0.0–0.7)
Hemoglobin: 12.2 g/dL — ABNORMAL LOW (ref 13.0–17.0)
Lymphocytes Relative: 13 % (ref 12–46)
Lymphs Abs: 1.3 10*3/uL (ref 0.7–4.0)
MCH: 30.6 pg (ref 26.0–34.0)
Monocytes Relative: 9 % (ref 3–12)
Neutro Abs: 7.7 10*3/uL (ref 1.7–7.7)
Neutrophils Relative %: 76 % (ref 43–77)
Platelets: 226 10*3/uL (ref 150–400)
RBC: 3.99 MIL/uL — ABNORMAL LOW (ref 4.22–5.81)
WBC: 10.1 10*3/uL (ref 4.0–10.5)

## 2012-06-03 LAB — GLUCOSE, CAPILLARY
Glucose-Capillary: 232 mg/dL — ABNORMAL HIGH (ref 70–99)
Glucose-Capillary: 204 mg/dL — ABNORMAL HIGH (ref 70–99)

## 2012-06-03 LAB — CARDIAC PANEL(CRET KIN+CKTOT+MB+TROPI)
CK, MB: 3.4 ng/mL (ref 0.3–4.0)
Total CK: 278 U/L — ABNORMAL HIGH (ref 7–232)

## 2012-06-03 LAB — MAGNESIUM: Magnesium: 2 mg/dL (ref 1.5–2.5)

## 2012-06-03 LAB — TSH: TSH: 1.743 u[IU]/mL (ref 0.350–4.500)

## 2012-06-03 LAB — HEMOGLOBIN A1C: Hgb A1c MFr Bld: 7.6 % — ABNORMAL HIGH (ref <5.7)

## 2012-06-03 MED ORDER — INSULIN GLARGINE 100 UNIT/ML ~~LOC~~ SOLN: 12.0000 [IU] | Freq: Every day | SUBCUTANEOUS | Status: DC

## 2012-06-03 MED ORDER — ACETAMINOPHEN 325 MG PO TABS: 650.0000 mg | ORAL_TABLET | ORAL | Status: DC | PRN

## 2012-06-03 MED ORDER — INSULIN ASPART 100 UNIT/ML ~~LOC~~ SOLN
0.0000 [IU] | Freq: Three times a day (TID) | SUBCUTANEOUS | Status: DC
  Administered 2012-06-03 (×2): 3 [IU] via SUBCUTANEOUS

## 2012-06-03 MED ORDER — ENOXAPARIN SODIUM 40 MG/0.4ML ~~LOC~~ SOLN
40.0000 mg | SUBCUTANEOUS | Status: DC
  Administered 2012-06-03: 40 mg via SUBCUTANEOUS
  Filled 2012-06-03: qty 0.4

## 2012-06-03 MED ORDER — SIMVASTATIN 40 MG PO TABS: 40.0000 mg | ORAL_TABLET | Freq: Every evening | ORAL | Status: DC

## 2012-06-03 MED ORDER — SODIUM CHLORIDE 0.9 % IV SOLN: 250.0000 mL | INTRAVENOUS | Status: DC | PRN

## 2012-06-03 MED ORDER — SODIUM CHLORIDE 0.9 % IJ SOLN: 3.0000 mL | INTRAMUSCULAR | Status: DC | PRN

## 2012-06-03 MED ORDER — LISINOPRIL 2.5 MG PO TABS
2.5000 mg | ORAL_TABLET | Freq: Every day | ORAL | Status: DC
  Administered 2012-06-03: 2.5 mg via ORAL
  Filled 2012-06-03: qty 1

## 2012-06-03 MED ORDER — ASPIRIN 81 MG PO CHEW
81.0000 mg | CHEWABLE_TABLET | Freq: Every day | ORAL | Status: DC
  Administered 2012-06-03: 81 mg via ORAL
  Filled 2012-06-03: qty 1

## 2012-06-03 MED ORDER — FOLIC ACID 1 MG PO TABS
1.0000 mg | ORAL_TABLET | Freq: Every day | ORAL | Status: DC
  Administered 2012-06-03: 1 mg via ORAL
  Filled 2012-06-03: qty 1

## 2012-06-03 MED ORDER — ATORVASTATIN CALCIUM 20 MG PO TABS
20.0000 mg | ORAL_TABLET | Freq: Every day | ORAL | Status: DC
  Filled 2012-06-03: qty 1

## 2012-06-03 MED ORDER — GLYBURIDE 5 MG PO TABS
5.0000 mg | ORAL_TABLET | Freq: Two times a day (BID) | ORAL | Status: DC
Start: 2012-06-03 — End: 2012-06-03
  Administered 2012-06-03: 5 mg via ORAL
  Filled 2012-06-03 (×3): qty 1

## 2012-06-03 MED ORDER — ALPRAZOLAM 0.25 MG PO TABS: 0.2500 mg | ORAL_TABLET | Freq: Two times a day (BID) | ORAL | Status: DC | PRN

## 2012-06-03 MED ORDER — PREDNISONE 5 MG PO TABS
5.0000 mg | ORAL_TABLET | Freq: Every day | ORAL | Status: DC
  Administered 2012-06-03: 5 mg via ORAL
  Filled 2012-06-03 (×2): qty 1

## 2012-06-03 MED ORDER — SODIUM CHLORIDE 0.9 % IJ SOLN: 3.0000 mL | Freq: Two times a day (BID) | INTRAMUSCULAR | Status: DC

## 2012-06-03 MED ORDER — AMLODIPINE BESYLATE 10 MG PO TABS
10.0000 mg | ORAL_TABLET | Freq: Every day | ORAL | Status: DC
  Administered 2012-06-03: 10 mg via ORAL
  Filled 2012-06-03: qty 1

## 2012-06-03 MED ORDER — METOPROLOL TARTRATE 50 MG PO TABS
50.0000 mg | ORAL_TABLET | Freq: Two times a day (BID) | ORAL | Status: DC
  Administered 2012-06-03: 50 mg via ORAL
  Filled 2012-06-03 (×2): qty 1

## 2012-06-03 MED ORDER — METHOTREXATE 2.5 MG PO TABS
10.0000 mg | ORAL_TABLET | ORAL | Status: DC
  Filled 2012-06-03: qty 4

## 2012-06-03 MED ORDER — PREDNISONE (PAK) 5 MG PO TABS
5.0000 mg | ORAL_TABLET | Freq: Every day | ORAL | Status: DC
  Filled 2012-06-03: qty 21

## 2012-06-03 MED ORDER — FUROSEMIDE 40 MG PO TABS
40.0000 mg | ORAL_TABLET | Freq: Every day | ORAL | Status: DC
  Administered 2012-06-03: 40 mg via ORAL
  Filled 2012-06-03: qty 1

## 2012-06-03 MED ORDER — NITROGLYCERIN 0.4 MG SL SUBL: 0.4000 mg | SUBLINGUAL_TABLET | SUBLINGUAL | Status: DC | PRN

## 2012-06-03 MED ORDER — ONDANSETRON HCL 4 MG/2ML IJ SOLN: 4.0000 mg | Freq: Four times a day (QID) | INTRAMUSCULAR | Status: DC | PRN

## 2012-06-03 NOTE — Progress Notes (Signed)
PATIENT ALERT AND ORIN. ORINATED TO ROOM. R.N. AWARE OF ARRIVAL AND INTO ASSESS PATIENT. V.S.S.  NO COMPLAINTS VOICED AT THIS TIME.

## 2012-06-03 NOTE — H&P (Signed)
Matthew Bean is an 78 y.o. male.   Chief Complaint: Weakness HPI: 78 year old African American man with history of CHF and other chronic medical problems presenting with generalized weakness more in the lower extremity. Patient has not been able to walk adequately in the last couple of days. He has some pain in his lower extremities but that is chronic. Patient believes he probably has arthritis that is stopping him from walking. On further questioning it seems like his weakness is generalized. Has mild shortness of breath no chest pain. No cough no nausea vomiting or diarrhea. His evaluation in the ED WAS not revealing unless for elevated BNP. He also hs rheumatoid arthritis and he is on chronic steroids.  Past Medical History  Diagnosis Date  . Hyperlipidemia   . Diabetes mellitus   . CHF (congestive heart failure)   . Hypertension   . Myocardial infarction   . Nephropathy   . Renal insufficiency   . CAD (coronary artery disease)   . Pulmonary nodule   . Rheumatoid arthritis   . Hx of adenomatous colonic polyps     Past Surgical History  Procedure Date  . Angioplasty 1997, 2008  . Coronary stent placement 2002    coronary  . Hernia repair     Inguinal and ventral  . Colonoscopy w/ polypectomy 02/20/2011    8 polyps removed, largest 1 cm, mix of adenomas, lymphoid aggregates and hyperplastic    Family History  Problem Relation Age of Onset  . Stroke Mother   . Stroke Father    Social History:  reports that he quit smoking about 23 years ago. He quit smokeless tobacco use about 6 years ago. He reports that he does not drink alcohol or use illicit drugs.  Allergies: No Known Allergies  Medications Prior to Admission  Medication Sig Dispense Refill  . acetic acid-hydrocortisone (VOSOL-HC) otic solution Place 3 drops into both ears as needed.        Marland Kitchen amLODipine (NORVASC) 10 MG tablet Take 10 mg by mouth daily.      Marland Kitchen aspirin 81 MG chewable tablet Chew 81 mg by mouth daily.       . folic acid (FOLVITE) 1 MG tablet Take 1 mg by mouth daily.      Marland Kitchen glucose blood (ONE TOUCH TEST STRIPS) test strip 1 each by Other route as needed. Use as instructed       . glyBURIDE (DIABETA) 5 MG tablet Take 5 mg by mouth 2 (two) times daily with a meal.      . hydrochlorothiazide (HYDRODIURIL) 25 MG tablet Take 1 tablet by mouth daily.      . insulin glargine (LANTUS SOLOSTAR) 100 UNIT/ML injection Inject 12 Units into the skin at bedtime.       . methotrexate (RHEUMATREX) 2.5 MG tablet Take 4 tablets by mouth 2 (two) times a week.      . metoprolol (LOPRESSOR) 50 MG tablet Take 50 mg by mouth 2 (two) times daily.      . nitroGLYCERIN (NITROSTAT) 0.4 MG SL tablet Place 0.4 mg under the tongue every 5 (five) minutes as needed. For chest pain      . predniSONE (STERAPRED UNI-PAK) 5 MG TABS Take 5 mg by mouth daily.      . simvastatin (ZOCOR) 40 MG tablet Take 40 mg by mouth every evening.        Results for orders placed during the hospital encounter of 06/02/12 (from the past 48 hour(s))  POCT I-STAT, CHEM 8     Status: Abnormal   Collection Time   06/02/12  4:56 PM      Component Value Range Comment   Sodium 140  135 - 145 mEq/L    Potassium 4.1  3.5 - 5.1 mEq/L    Chloride 106  96 - 112 mEq/L    BUN 31 (*) 6 - 23 mg/dL    Creatinine, Ser 9.56 (*) 0.50 - 1.35 mg/dL    Glucose, Bld 213 (*) 70 - 99 mg/dL    Calcium, Ion 0.86  5.78 - 1.30 mmol/L    TCO2 24  0 - 100 mmol/L    Hemoglobin 14.6  13.0 - 17.0 g/dL    HCT 46.9  62.9 - 52.8 %   GLUCOSE, CAPILLARY     Status: Abnormal   Collection Time   06/02/12  6:55 PM      Component Value Range Comment   Glucose-Capillary 161 (*) 70 - 99 mg/dL   URINALYSIS, ROUTINE W REFLEX MICROSCOPIC     Status: Abnormal   Collection Time   06/02/12  8:26 PM      Component Value Range Comment   Color, Urine YELLOW  YELLOW    APPearance CLEAR  CLEAR    Specific Gravity, Urine 1.021  1.005 - 1.030    pH 5.5  5.0 - 8.0    Glucose, UA NEGATIVE   NEGATIVE mg/dL    Hgb urine dipstick MODERATE (*) NEGATIVE    Bilirubin Urine NEGATIVE  NEGATIVE    Ketones, ur NEGATIVE  NEGATIVE mg/dL    Protein, ur 413 (*) NEGATIVE mg/dL    Urobilinogen, UA 1.0  0.0 - 1.0 mg/dL    Nitrite NEGATIVE  NEGATIVE    Leukocytes, UA NEGATIVE  NEGATIVE   URINE MICROSCOPIC-ADD ON     Status: Normal   Collection Time   06/02/12  8:26 PM      Component Value Range Comment   WBC, UA 0-2  <3 WBC/hpf    RBC / HPF 0-2  <3 RBC/hpf   TROPONIN I     Status: Normal   Collection Time   06/02/12 11:01 PM      Component Value Range Comment   Troponin I <0.30  <0.30 ng/mL   CK TOTAL AND CKMB     Status: Abnormal   Collection Time   06/02/12 11:01 PM      Component Value Range Comment   Total CK 331 (*) 7 - 232 U/L    CK, MB 4.0  0.3 - 4.0 ng/mL    Relative Index 1.2  0.0 - 2.5   PRO B NATRIURETIC PEPTIDE     Status: Abnormal   Collection Time   06/02/12 11:01 PM      Component Value Range Comment   Pro B Natriuretic peptide (BNP) 1399.0 (*) 0 - 450 pg/mL   GLUCOSE, CAPILLARY     Status: Abnormal   Collection Time   06/02/12 11:20 PM      Component Value Range Comment   Glucose-Capillary 107 (*) 70 - 99 mg/dL   CBC WITH DIFFERENTIAL     Status: Abnormal   Collection Time   06/03/12  1:00 AM      Component Value Range Comment   WBC 10.1  4.0 - 10.5 K/uL    RBC 3.99 (*) 4.22 - 5.81 MIL/uL    Hemoglobin 12.2 (*) 13.0 - 17.0 g/dL    HCT 24.4 (*) 01.0 - 52.0 %  MCV 95.0  78.0 - 100.0 fL    MCH 30.6  26.0 - 34.0 pg    MCHC 32.2  30.0 - 36.0 g/dL    RDW 16.1  09.6 - 04.5 %    Platelets 226  150 - 400 K/uL    Neutrophils Relative 76  43 - 77 %    Neutro Abs 7.7  1.7 - 7.7 K/uL    Lymphocytes Relative 13  12 - 46 %    Lymphs Abs 1.3  0.7 - 4.0 K/uL    Monocytes Relative 9  3 - 12 %    Monocytes Absolute 0.9  0.1 - 1.0 K/uL    Eosinophils Relative 3  0 - 5 %    Eosinophils Absolute 0.3  0.0 - 0.7 K/uL    Basophils Relative 0  0 - 1 %    Basophils Absolute 0.0   0.0 - 0.1 K/uL    Dg Chest 2 View  06/02/2012  *RADIOLOGY REPORT*  Clinical Data: Generalized weakness.  CHEST - 2 VIEW  Comparison: PA and lateral chest 06/16/2011.  Findings: Lungs are clear.  Heart size is normal.  No pneumothorax or pleural fluid.  Degenerative change of the thoracic spine and about the shoulders noted.  IMPRESSION: No acute abnormality.   Original Report Authenticated By: Bernadene Bell. D'ALESSIO, M.D.    Dg Pelvis 1-2 Views  06/02/2012  *RADIOLOGY REPORT*  Clinical Data: Weakness.  PELVIS - 1-2 VIEW  Comparison: None.  Findings: There is no acute bony or joint abnormality.  Mild to moderate bilateral hip degenerative change is noted.  Advanced appearing lumbar spondylosis is seen.  IMPRESSION: No acute finding.  Bilateral hip degenerative disease and lower lumbar spondylosis noted.   Original Report Authenticated By: Bernadene Bell. Maricela Curet, M.D.    Ct Head Wo Contrast  06/02/2012  *RADIOLOGY REPORT*  Clinical Data: Leg weakness and generalized weakness  CT HEAD WITHOUT CONTRAST  Technique:  Contiguous axial images were obtained from the base of the skull through the vertex without contrast.  Comparison: None.  Findings: Prominence of the sulci, cisterns, and ventricles, in keeping with volume loss. There are subcortical and periventricular white matter hypodensities, a nonspecific finding most often seen with chronic microangiopathic changes.  There is no evidence for acute hemorrhage, overt hydrocephalus, mass lesion.  No definite CT evidence for acute cortical based (large artery) infarction.  More confluent hypodensity within the right frontal parietal lobe may reflect sequelae of prior infarction.  Focal hypodensity within the right cerebellum may correspond to a prior lacunar infarction.  Prominent extra-axial spaces anteriorly measure CSF attenuation when accounting for streak artifact.  No hyperdense component.  The visualized paranasal sinuses and mastoid air cells are predominately  clear.  IMPRESSION: White matter changes and volume loss.  Question remote infarction involving the right frontal parietal lobe and lacunar infarction within the right cerebellum.  No definite CT evidence of acute intracranial abnormality.  Prominent extra-axial spaces anteriorly are favored to be secondary to volume loss.  It is not possible to entirely exclude an isodense subdural collection in this setting though this is not favored.   Original Report Authenticated By: Waneta Martins, M.D.    Dg Knee Complete 4 Views Left  06/02/2012  *RADIOLOGY REPORT*  Clinical Data: Knee pain, generalized weakness.  LEFT KNEE - COMPLETE 4+ VIEW  Comparison: 03/15/2009  Findings: Mild tricompartmental degenerative changes, most pronounced medially.  Atherosclerotic vascular calcifications.  No acute fracture or dislocation identified. No large joint  effusion.  IMPRESSION: Degenerative changes.  No acute osseous finding.   Original Report Authenticated By: Waneta Martins, M.D.    Dg Knee Complete 4 Views Right  06/02/2012  *RADIOLOGY REPORT*  Clinical Data: Weakness.  Pain.  RIGHT KNEE - COMPLETE 4+ VIEW  Comparison: Plain films 05/12/2008.  Findings: There is no acute bony or joint abnormality. Degenerative disease most notable about the medial compartment is unchanged.  There is no joint effusion.  Atherosclerosis noted.  IMPRESSION: No acute finding.  Stable compared to prior exam.   Original Report Authenticated By: Bernadene Bell. Maricela Curet, M.D.     Review of Systems  Constitutional: Positive for malaise/fatigue.  HENT: Negative.   Eyes: Negative.   Respiratory: Positive for shortness of breath. Negative for cough, hemoptysis, sputum production and wheezing.   Cardiovascular: Positive for chest pain. Negative for orthopnea, claudication and leg swelling.  Gastrointestinal: Negative.   Genitourinary: Negative.   Musculoskeletal: Negative.   Skin: Negative.   Neurological: Positive for weakness.    Endo/Heme/Allergies: Negative.   Psychiatric/Behavioral: Negative.     Blood pressure 149/70, pulse 75, temperature 97.8 F (36.6 C), temperature source Oral, resp. rate 20, height 6' (1.829 m), weight 102.422 kg (225 lb 12.8 oz), SpO2 100.00%. Physical Exam  Constitutional: He is oriented to person, place, and time. He appears well-developed and well-nourished.  HENT:  Head: Normocephalic and atraumatic.  Right Ear: External ear normal.  Left Ear: External ear normal.  Nose: Nose normal.  Mouth/Throat: Oropharynx is clear and moist.  Eyes: Conjunctivae and EOM are normal. Pupils are equal, round, and reactive to light.  Neck: Normal range of motion. Neck supple.  Cardiovascular: Normal rate, regular rhythm, normal heart sounds and intact distal pulses.   Respiratory: Effort normal and breath sounds normal. No respiratory distress.  GI: Soft. Bowel sounds are normal.  Musculoskeletal: Normal range of motion. He exhibits no edema and no tenderness.  Neurological: He is alert and oriented to person, place, and time. He has normal reflexes.  Skin: Skin is warm and dry.  Psychiatric: He has a normal mood and affect. His behavior is normal. Judgment and thought content normal.     Assessment/Plan A 78 year old gentleman with generalized weakness no localizing lower extremity. More than likely this is multifactorial including his CHF history rheumatoid arthritis et Karie Soda.  Plan #1 weakness: Patient will be admitted to the floor. We'll address his medical problems. We'll get PTOT to evaluate him. We'll determine whether he can go back home or will need to go to a facility.  #2 congestive heart failure: This could be the reason for his weakness especially with mobility. Frequent incidental dehydration his beta blockers ACE inhibitor will be added.  #3 gout: No evidence of gouty attacks at this point.  #4 hypertension: Blood pressure is controlled his home regimen we'll continue.  #5  chronic kidney disease stage III: Continue to monitor his kidney functions. Motor likely this is diabetic nephropathy. GARBA,LAWAL 06/03/2012, 6:04 AM

## 2012-06-03 NOTE — Progress Notes (Addendum)
Subjective: Feeling weak, especially in his legs.  No other specific complaints.  Objective: Vital signs in last 24 hours: Filed Vitals:   06/03/12 0045 06/03/12 0115 06/03/12 0417 06/03/12 0517  BP: 137/56 153/119 149/70   Pulse: 73 70 75   Temp:   97.8 F (36.6 C)   TempSrc:   Oral   Resp: 15 11 20    Height:   6' (1.829 m)   Weight:   102.422 kg (225 lb 12.8 oz) 102.422 kg (225 lb 12.8 oz)  SpO2: 95% 93% 100%    Weight change:   Intake/Output Summary (Last 24 hours) at 06/03/12 1100 Last data filed at 06/03/12 0809  Gross per 24 hour  Intake    120 ml  Output    350 ml  Net   -230 ml    Physical Exam: General: Awake, Oriented, No acute distress. HEENT: EOMI. Neck: Supple CV: S1 and S2 Lungs: Clear to ascultation bilaterally Abdomen: Soft, Nontender, Nondistended, +bowel sounds. Ext: Good pulses. 1+ LE  edema.  Lab Results: Basic Metabolic Panel:  Lab 06/03/12 1610 06/02/12 1656  NA -- 140  K -- 4.1  CL -- 106  CO2 -- --  GLUCOSE -- 201*  BUN -- 31*  CREATININE 1.63* 1.80*  CALCIUM -- --  MG 2.0 --  PHOS -- --   Liver Function Tests: No results found for this basename: AST:5,ALT:5,ALKPHOS:5,BILITOT:5,PROT:5,ALBUMIN:5 in the last 168 hours No results found for this basename: LIPASE:5,AMYLASE:5 in the last 168 hours No results found for this basename: AMMONIA:5 in the last 168 hours CBC:  Lab 06/03/12 0530 06/03/12 0100 06/02/12 1656  WBC 9.2 10.1 --  NEUTROABS -- 7.7 --  HGB 11.7* 12.2* 14.6  HCT 37.0* 37.9* 43.0  MCV 94.6 95.0 --  PLT 201 226 --   Cardiac Enzymes:  Lab 06/03/12 0710 06/02/12 2301  CKTOTAL 278* 331*  CKMB 3.4 4.0  CKMBINDEX -- --  TROPONINI <0.30 <0.30   BNP (last 3 results)  Basename 06/02/12 2301  PROBNP 1399.0*   CBG:  Lab 06/03/12 0611 06/02/12 2320 06/02/12 1855  GLUCAP 232* 107* 161*   No results found for this basename: HGBA1C:5 in the last 72 hours Other Labs: No components found with this basename:  POCBNP:3 No results found for this basename: DDIMER:2 in the last 168 hours No results found for this basename: CHOL:2,HDL:2,LDLCALC:2,TRIG:2,CHOLHDL:2,LDLDIRECT:2 in the last 168 hours No results found for this basename: TSH,T4TOTAL,FREET3,T3FREE,FREET4,THYROIDAB in the last 168 hours No results found for this basename: VITAMINB12:2,FOLATE:2,FERRITIN:2,TIBC:2,IRON:2,RETICCTPCT:2 in the last 168 hours  Micro Results: No results found for this or any previous visit (from the past 240 hour(s)).  Studies/Results: Dg Chest 2 View  06/02/2012  *RADIOLOGY REPORT*  Clinical Data: Generalized weakness.  CHEST - 2 VIEW  Comparison: PA and lateral chest 06/16/2011.  Findings: Lungs are clear.  Heart size is normal.  No pneumothorax or pleural fluid.  Degenerative change of the thoracic spine and about the shoulders noted.  IMPRESSION: No acute abnormality.   Original Report Authenticated By: Bernadene Bell. D'ALESSIO, M.D.    Dg Pelvis 1-2 Views  06/02/2012  *RADIOLOGY REPORT*  Clinical Data: Weakness.  PELVIS - 1-2 VIEW  Comparison: None.  Findings: There is no acute bony or joint abnormality.  Mild to moderate bilateral hip degenerative change is noted.  Advanced appearing lumbar spondylosis is seen.  IMPRESSION: No acute finding.  Bilateral hip degenerative disease and lower lumbar spondylosis noted.   Original Report Authenticated By: Bernadene Bell. Maricela Curet, M.D.  Ct Head Wo Contrast  06/02/2012  *RADIOLOGY REPORT*  Clinical Data: Leg weakness and generalized weakness  CT HEAD WITHOUT CONTRAST  Technique:  Contiguous axial images were obtained from the base of the skull through the vertex without contrast.  Comparison: None.  Findings: Prominence of the sulci, cisterns, and ventricles, in keeping with volume loss. There are subcortical and periventricular white matter hypodensities, a nonspecific finding most often seen with chronic microangiopathic changes.  There is no evidence for acute hemorrhage, overt  hydrocephalus, mass lesion.  No definite CT evidence for acute cortical based (large artery) infarction.  More confluent hypodensity within the right frontal parietal lobe may reflect sequelae of prior infarction.  Focal hypodensity within the right cerebellum may correspond to a prior lacunar infarction.  Prominent extra-axial spaces anteriorly measure CSF attenuation when accounting for streak artifact.  No hyperdense component.  The visualized paranasal sinuses and mastoid air cells are predominately clear.  IMPRESSION: White matter changes and volume loss.  Question remote infarction involving the right frontal parietal lobe and lacunar infarction within the right cerebellum.  No definite CT evidence of acute intracranial abnormality.  Prominent extra-axial spaces anteriorly are favored to be secondary to volume loss.  It is not possible to entirely exclude an isodense subdural collection in this setting though this is not favored.   Original Report Authenticated By: Waneta Martins, M.D.    Dg Knee Complete 4 Views Left  06/02/2012  *RADIOLOGY REPORT*  Clinical Data: Knee pain, generalized weakness.  LEFT KNEE - COMPLETE 4+ VIEW  Comparison: 03/15/2009  Findings: Mild tricompartmental degenerative changes, most pronounced medially.  Atherosclerotic vascular calcifications.  No acute fracture or dislocation identified. No large joint effusion.  IMPRESSION: Degenerative changes.  No acute osseous finding.   Original Report Authenticated By: Waneta Martins, M.D.    Dg Knee Complete 4 Views Right  06/02/2012  *RADIOLOGY REPORT*  Clinical Data: Weakness.  Pain.  RIGHT KNEE - COMPLETE 4+ VIEW  Comparison: Plain films 05/12/2008.  Findings: There is no acute bony or joint abnormality. Degenerative disease most notable about the medial compartment is unchanged.  There is no joint effusion.  Atherosclerosis noted.  IMPRESSION: No acute finding.  Stable compared to prior exam.   Original Report Authenticated  By: Bernadene Bell. Maricela Curet, M.D.     Medications: I have reviewed the patient's current medications. Scheduled Meds:   . amLODipine  10 mg Oral Daily  . aspirin  81 mg Oral Daily  . atorvastatin  20 mg Oral q1800  . enoxaparin  40 mg Subcutaneous Q24H  . folic acid  1 mg Oral Daily  . furosemide  40 mg Oral Daily  . glyBURIDE  5 mg Oral BID WC  . insulin aspart  0-9 Units Subcutaneous TID WC  . insulin glargine  12 Units Subcutaneous QHS  . lisinopril  2.5 mg Oral Daily  . metoprolol  50 mg Oral BID  . oxyCODONE-acetaminophen  2 tablet Oral Once  . predniSONE  5 mg Oral Q breakfast  . sodium chloride  3 mL Intravenous Q12H  . DISCONTD: furosemide  40 mg Intravenous Once  . DISCONTD: methotrexate  10 mg Oral 2 times weekly  . DISCONTD: predniSONE  5 mg Oral Q breakfast  . DISCONTD: simvastatin  40 mg Oral QPM   Continuous Infusions:  PRN Meds:.sodium chloride, acetaminophen, ALPRAZolam, nitroGLYCERIN, ondansetron (ZOFRAN) IV, sodium chloride  Assessment/Plan: Generalized weakness Likely due to the deconditioning from rheumatoid arthritis.  Will have PT/OT evaluate  the patient.  Will arrange for home health physical therapy/occupational therapy and RN.   Chronic systolic congestive heart failure Patient appears compensated.  Last available 2-D echocardiogram was on May of 2008 which showed LVEF 40% with hypokinesis of inferior wall, mid/distal inferoseptal wall, distal lateral, discharge anterior and apex.  Has not been on lisinopril due to chronic kidney disease stage III.  Patient does not appear to be in volume overload at this time though he has edema on exam.  Recommend patient followup with primary care physician for consideration of outpatient 2-D echocardiogram for further evaluation to determine if chronic systolic congestive heart failure may be contributing to generalized weakness.  Rheumatoid arthritis Discussed with his rheumatologist, Dr. Kellie Simmering, who recommended holding  methotrexate as patient has missed an appointment and had the dose changed. Dr. Kellie Simmering, will reinitiate methotrexate at the next clinic visit.  Continue prednisone.  Gout Stable.  Not an active issue at this time.  Hypertension Stable continue home regimen.  Chronic kidney disease stage III Creatinine 1.6, at baseline.  Prophylaxis Ambulate.  CODE STATUS Full code.  Disposition Discharge the patient today after arranging for home health PT/OT/RN.   LOS: 1 day  Gean Larose A, MD 06/03/2012, 11:00 AM

## 2012-06-03 NOTE — Progress Notes (Signed)
PT Cancellation Note  Treatment cancelled today due to pt d/c without PT evaluation.  Performed chart review however pt d/c.  Attempted to see pt x 2.  Thanks!!.  Shayma Pfefferle 06/03/2012, 1:34 PM Jake Shark, PT DPT [phone]

## 2012-06-03 NOTE — Discharge Summary (Signed)
Physician Discharge Summary  Matthew Bean:[id number] DOB: Feb 05, 1933 DOA: 06/02/2012  PCP: Matthew Petit, MD  Admit date: 06/02/2012 Discharge date: 06/03/2012  Recommendations for Outpatient Follow-up:  1. Followup with Matthew Petit, MD (PCP) in 1 week. 2. Primary care physician to consider outpatient 2-D echocardiogram for further workup of chronic systolic heart failure. 3. To followup with Dr. Kellie Bean (rheumatology) in 1 week for management of rheumatoid arthritis.  Discharge Diagnoses:  Principal Problem:  *Weakness generalized Active Problems:  DM (diabetes mellitus), type 2, uncontrolled with complications  HYPERLIPIDEMIA  GOUT  HYPERTENSION  CORONARY ARTERY DISEASE  CONGESTIVE HEART FAILURE  RENAL INSUFFICIENCY  Rheumatoid arthritis  Discharge Condition: Stable  Diet recommendation: Diabetic diet  Filed Weights   06/03/12 0417 06/03/12 0517  Weight: 102.422 kg (225 lb 12.8 oz) 102.422 kg (225 lb 12.8 oz)    History of present illness:  78 year old African American male with history of chronic systolic congestive heart failure, rheumatoid arthritis, hypertension, hyperlipidemia, and diabetes presents with generalized weakness with fluctuating edema on 06/03/2012 for  Hospital Course:  Generalized weakness Likely due to the deconditioning from rheumatoid arthritis.  Will have PT/OT evaluate the patient.  Will arrange for home health physical therapy/occupational therapy and RN.   Chronic systolic congestive heart failure Patient appears compensated.  Pro BNP in the setting of chronic kidney disease stage III was 1399, no Pro BNP for comparison.  Last available 2-D echocardiogram was on May of 2008 which showed LVEF 40% with hypokinesis of inferior wall, mid/distal inferoseptal wall, distal lateral, discharge anterior and apex.  Has not been on lisinopril due to chronic kidney disease stage III.  Patient does not appear to be in volume overload at this time  though he has edema on exam.  Recommend patient followup with primary care physician for consideration of outpatient 2-D echocardiogram for further evaluation to determine if chronic systolic congestive heart failure may be contributing to generalized weakness.  Rheumatoid arthritis Discussed with his rheumatologist, Dr. Kellie Bean, who recommended holding methotrexate as patient has missed an appointment and had the dose changed. Dr. Kellie Bean, will reinitiate methotrexate at the next clinic visit, patient was instructed to hold methotrexate for now.  Continue prednisone.  Gout Stable.  Not an active issue at this time.  Hypertension Stable continue home regimen.  Chronic kidney disease stage III Creatinine 1.6, at baseline.  Type 2 diabetes uncontrolled with complications  Continue home insulin regimen.    Hyperlipidemia  Continue statin.    Procedures:  None  Consultations:  None  Discharge Exam: Filed Vitals:   06/03/12 0417  BP: 149/70  Pulse: 75  Temp: 97.8 F (36.6 C)  Resp: 20   Filed Vitals:   06/03/12 0045 06/03/12 0115 06/03/12 0417 06/03/12 0517  BP: 137/56 153/119 149/70   Pulse: 73 70 75   Temp:   97.8 F (36.6 C)   TempSrc:   Oral   Resp: 15 11 20    Height:   6' (1.829 m)   Weight:   102.422 kg (225 lb 12.8 oz) 102.422 kg (225 lb 12.8 oz)  SpO2: 95% 93% 100%    Discharge Instructions  Discharge Orders    Future Orders Please Complete By Expires   Diet - low sodium heart healthy      Increase activity slowly      Discharge instructions      Comments:   Followup with Matthew Petit, MD (PCP) in 1 week. Followup with Dr. Kellie Bean (rheumatology) in 1 week.  Medication List  As of 06/03/2012 11:14 AM   TAKE these medications         acetic acid-hydrocortisone otic solution   Commonly known as: VOSOL-HC   Place 3 drops into both ears as needed.      amLODipine 10 MG tablet   Commonly known as: NORVASC   Take 10 mg by mouth daily.       aspirin 81 MG chewable tablet   Chew 81 mg by mouth daily.      folic acid 1 MG tablet   Commonly known as: FOLVITE   Take 1 mg by mouth daily.      glyBURIDE 5 MG tablet   Commonly known as: DIABETA   Take 5 mg by mouth 2 (two) times daily with a meal.      hydrochlorothiazide 25 MG tablet   Commonly known as: HYDRODIURIL   Take 1 tablet by mouth daily.      LANTUS SOLOSTAR 100 UNIT/ML injection   Generic drug: insulin glargine   Inject 12 Units into the skin at bedtime.      methotrexate 2.5 MG tablet   Commonly known as: RHEUMATREX   Take 4 tablets by mouth 2 (two) times a week.      metoprolol 50 MG tablet   Commonly known as: LOPRESSOR   Take 50 mg by mouth 2 (two) times daily.      nitroGLYCERIN 0.4 MG SL tablet   Commonly known as: NITROSTAT   Place 0.4 mg under the tongue every 5 (five) minutes as needed. For chest pain      ONE TOUCH TEST STRIPS test strip   Generic drug: glucose blood   1 each by Other route as needed. Use as instructed      predniSONE 5 MG Tabs   Commonly known as: STERAPRED UNI-PAK   Take 5 mg by mouth daily.      simvastatin 40 MG tablet   Commonly known as: ZOCOR   Take 40 mg by mouth every evening.           Follow-up Information    Follow up with Matthew Petit, MD. Schedule an appointment as soon as possible for a visit today.   Contact information:   [address] [place] [address] [phone]       Follow up with [name], MD. Schedule an appointment as soon as possible for a visit in 1 week. (To discuss about when to resume methotrexate.)    Contact information:   [address] [place] [address] [phone]           The results of significant diagnostics from this hospitalization (including imaging, microbiology, ancillary and laboratory) are listed below for reference.    Significant Diagnostic Studies: Dg Chest 2 View  06/02/2012  *RADIOLOGY  REPORT*  Clinical Data: Generalized weakness.  CHEST - 2 VIEW  Comparison: PA and lateral chest 06/16/2011.  Findings: Lungs are clear.  Heart size is normal.  No pneumothorax or pleural fluid.  Degenerative change of the thoracic spine and about the shoulders noted.  IMPRESSION: No acute abnormality.   Original Report Authenticated By: Bernadene Bell. D'ALESSIO, M.D.    Dg Pelvis 1-2 Views  06/02/2012  *RADIOLOGY REPORT*  Clinical Data: Weakness.  PELVIS - 1-2 VIEW  Comparison: None.  Findings: There is no acute bony or joint abnormality.  Mild to moderate bilateral hip degenerative change is noted.  Advanced appearing lumbar spondylosis is seen.  IMPRESSION:  No acute finding.  Bilateral hip degenerative disease and lower lumbar spondylosis noted.   Original Report Authenticated By: Bernadene Bell. Maricela Curet, M.D.    Ct Head Wo Contrast  06/02/2012  *RADIOLOGY REPORT*  Clinical Data: Leg weakness and generalized weakness  CT HEAD WITHOUT CONTRAST  Technique:  Contiguous axial images were obtained from the base of the skull through the vertex without contrast.  Comparison: None.  Findings: Prominence of the sulci, cisterns, and ventricles, in keeping with volume loss. There are subcortical and periventricular white matter hypodensities, a nonspecific finding most often seen with chronic microangiopathic changes.  There is no evidence for acute hemorrhage, overt hydrocephalus, mass lesion.  No definite CT evidence for acute cortical based (large artery) infarction.  More confluent hypodensity within the right frontal parietal lobe may reflect sequelae of prior infarction.  Focal hypodensity within the right cerebellum may correspond to a prior lacunar infarction.  Prominent extra-axial spaces anteriorly measure CSF attenuation when accounting for streak artifact.  No hyperdense component.  The visualized paranasal sinuses and mastoid air cells are predominately clear.  IMPRESSION: White matter changes and volume loss.   Question remote infarction involving the right frontal parietal lobe and lacunar infarction within the right cerebellum.  No definite CT evidence of acute intracranial abnormality.  Prominent extra-axial spaces anteriorly are favored to be secondary to volume loss.  It is not possible to entirely exclude an isodense subdural collection in this setting though this is not favored.   Original Report Authenticated By: Waneta Martins, M.D.    Dg Knee Complete 4 Views Left  06/02/2012  *RADIOLOGY REPORT*  Clinical Data: Knee pain, generalized weakness.  LEFT KNEE - COMPLETE 4+ VIEW  Comparison: 03/15/2009  Findings: Mild tricompartmental degenerative changes, most pronounced medially.  Atherosclerotic vascular calcifications.  No acute fracture or dislocation identified. No large joint effusion.  IMPRESSION: Degenerative changes.  No acute osseous finding.   Original Report Authenticated By: Waneta Martins, M.D.    Dg Knee Complete 4 Views Right  06/02/2012  *RADIOLOGY REPORT*  Clinical Data: Weakness.  Pain.  RIGHT KNEE - COMPLETE 4+ VIEW  Comparison: Plain films 05/12/2008.  Findings: There is no acute bony or joint abnormality. Degenerative disease most notable about the medial compartment is unchanged.  There is no joint effusion.  Atherosclerosis noted.  IMPRESSION: No acute finding.  Stable compared to prior exam.   Original Report Authenticated By: Bernadene Bell. Maricela Curet, M.D.     Microbiology: No results found for this or any previous visit (from the past 240 hour(s)).   Labs: Basic Metabolic Panel:  Lab 06/03/12 1610 06/02/12 1656  NA -- 140  K -- 4.1  CL -- 106  CO2 -- --  GLUCOSE -- 201*  BUN -- 31*  CREATININE 1.63* 1.80*  CALCIUM -- --  MG 2.0 --  PHOS -- --   Liver Function Tests: No results found for this basename: AST:5,ALT:5,ALKPHOS:5,BILITOT:5,PROT:5,ALBUMIN:5 in the last 168 hours No results found for this basename: LIPASE:5,AMYLASE:5 in the last 168 hours No results  found for this basename: AMMONIA:5 in the last 168 hours CBC:  Lab 06/03/12 0530 06/03/12 0100 06/02/12 1656  WBC 9.2 10.1 --  NEUTROABS -- 7.7 --  HGB 11.7* 12.2* 14.6  HCT 37.0* 37.9* 43.0  MCV 94.6 95.0 --  PLT 201 226 --   Cardiac Enzymes:  Lab 06/03/12 0710 06/02/12 2301  CKTOTAL 278* 331*  CKMB 3.4 4.0  CKMBINDEX -- --  TROPONINI <0.30 <0.30   BNP:  BNP (last 3 results)  Basename 06/02/12 2301  PROBNP 1399.0*   CBG:  Lab 06/03/12 0611 06/02/12 2320 06/02/12 1855  GLUCAP 232* 107* 161*    Time coordinating discharge: 25 minutes  Signed:  Amonie Wisser A  [facility] 06/03/2012, 11:14 AM

## 2012-06-03 NOTE — Care Management Note (Unsigned)
    Page 1 of 1   06/03/2012     11:24:26 AM   CARE MANAGEMENT NOTE 06/03/2012  Patient:  Matthew Bean, Matthew Bean   Account Number:  [id number]  Date Initiated:  06/03/2012  Documentation initiated by:  SIMMONS,Torrian Canion  Subjective/Objective Assessment:   ADMITTED WITH CP; LIVES AT HOME WITH WIFE; HAS RW AND CANE ALREADY AT HOME.     Action/Plan:   DISCHARGE PLANNING DISCUSSED AT BEDSIDE.   Anticipated DC Date:  06/04/2012   Anticipated DC Plan:  HOME W HOME HEALTH SERVICES      DC Planning Services  CM consult      Choice offered to / List presented to:          [facility] arranged  HH-1 RN  HH-10 DISEASE MANAGEMENT  HH-3 OT      [facility] agency  [facility]   Status of service:  In process, will continue to follow Medicare Important Message given?   (If response is "NO", the following Medicare IM given date fields will be blank) Date Medicare IM given:   Date Additional Medicare IM given:    Discharge Disposition:    Per UR Regulation:  Reviewed for med. necessity/level of care/duration of stay  If discussed at Long Length of Stay Meetings, dates discussed:    Comments:  06/03/12  1033  Zoi Devine SIMMONS RN, BSN [phone] REFERRAL PLACED TO [facility] FOR [facility]N; SOC DATE: WITHIN 24-48 HRS POST D/C; NCM WILL FOLLOW.

## 2012-06-03 NOTE — Telephone Encounter (Signed)
Pt was discharge from [facility] today DX trouble walking. Pt needs post hos fup?

## 2012-06-04 NOTE — Telephone Encounter (Signed)
Pt is sch with NP on 06-12-2012

## 2012-06-04 NOTE — Telephone Encounter (Signed)
Next available or f/u with padonda

## 2012-06-04 NOTE — Telephone Encounter (Signed)
Matthew Bean, please see below. Thank you.

## 2012-06-12 ENCOUNTER — Ambulatory Visit (INDEPENDENT_AMBULATORY_CARE_PROVIDER_SITE_OTHER): Payer: Medicare Other | Admitting: Family

## 2012-06-12 ENCOUNTER — Encounter: Payer: Self-pay | Admitting: Family

## 2012-06-12 VITALS — BP 126/66 | HR 88 | Temp 98.8°F | Wt 230.0 lb

## 2012-06-12 DIAGNOSIS — E119 Type 2 diabetes mellitus without complications: Secondary | ICD-10-CM

## 2012-06-12 DIAGNOSIS — E785 Hyperlipidemia, unspecified: Secondary | ICD-10-CM

## 2012-06-12 DIAGNOSIS — M069 Rheumatoid arthritis, unspecified: Secondary | ICD-10-CM

## 2012-06-12 DIAGNOSIS — I509 Heart failure, unspecified: Secondary | ICD-10-CM

## 2012-06-12 NOTE — Progress Notes (Signed)
Subjective:    Patient ID: Matthew Bean, male    DOB: 1932/10/27, 78 y.o.   MRN: [id number]  HPI 78 year old African American male, nonsmoker, patient of Dr. Cato Mulligan is in today for recheck of congestive heart failure, type 2 diabetes, hypertension, hyperlipidemia, and rheumatoid arthritis. Overall he still remarkably well since his discharge from the hospital with a CHF exacerbation. He stayed overnight and received Lasix that is helped his shortness of breath. Today his complaint is bilateral knee pain for which she saw rheumatology yesterday for and is on prednisone and methotrexate. He's better today than he had been in the past 2 days. Patient denies any lightheadedness, dizziness, chest pain, palpitations, shortness of breath or edema.  Apparently, [employer] care nurse has concerns the patient had a 2 pound weight gain and shortness of breath with minimal exertion. Today, patient appears to be: Well, denies any shortness of breath. His weight is 230 pounds and he was 226 pounds in April 2013. No significant weight gain.   [place] [employer]iculturist, commercial had suggested patient have a bone density scan and a flu vaccine.   Review of Systems  Constitutional: Negative.   HENT: Negative.   Eyes: Negative.   Respiratory: Negative.  Negative for shortness of breath and wheezing.   Cardiovascular: Negative.  Negative for chest pain, palpitations and leg swelling.  Gastrointestinal: Negative.   Genitourinary: Negative.   Musculoskeletal: Positive for arthralgias. Negative for back pain and joint swelling.       Bilateral knee pain  Skin: Negative.   Neurological: Negative.   Hematological: Negative.   Psychiatric/Behavioral: Negative.    Past Medical History  Diagnosis Date  . Hyperlipidemia   . Diabetes mellitus   . CHF (congestive heart failure)   . Hypertension   . Myocardial infarction   . Nephropathy   . Renal insufficiency   . CAD (coronary artery disease)   . Pulmonary nodule   .  Rheumatoid arthritis   . Hx of adenomatous colonic polyps     History   Social History  . Marital Status: Married    Spouse Name: N/A    Number of Children: 4  . Years of Education: N/A   Occupational History  . Retired [employer]    Social History Main Topics  . Smoking status: Former Smoker    Quit date: 02/06/1989  . Smokeless tobacco: Former Neurosurgeon    Quit date: 02/06/2006  . Alcohol Use: No  . Drug Use: No  . Sexually Active: Not on file   Other Topics Concern  . Not on file   Social History Narrative  . No narrative on file    Past Surgical History  Procedure Date  . Angioplasty 1997, 2008  . Coronary stent placement 2002    coronary  . Hernia repair     Inguinal and ventral  . Colonoscopy w/ polypectomy 02/20/2011    8 polyps removed, largest 1 cm, mix of adenomas, lymphoid aggregates and hyperplastic    Family History  Problem Relation Age of Onset  . Stroke Mother   . Stroke Father     No Known Allergies  Current Outpatient Prescriptions on File Prior to Visit  Medication Sig Dispense Refill  . acetic acid-hydrocortisone (VOSOL-HC) otic solution Place 3 drops into both ears as needed.        Marland Kitchen amLODipine (NORVASC) 10 MG tablet Take 10 mg by mouth daily.      Marland Kitchen aspirin 81 MG chewable tablet Chew 81  mg by mouth daily.      . folic acid (FOLVITE) 1 MG tablet Take 1 mg by mouth daily.      Marland Kitchen glucose blood (ONE TOUCH TEST STRIPS) test strip 1 each by Other route as needed. Use as instructed       . glyBURIDE (DIABETA) 5 MG tablet Take 5 mg by mouth 2 (two) times daily with a meal.      . hydrochlorothiazide (HYDRODIURIL) 25 MG tablet Take 1 tablet by mouth daily.      . insulin glargine (LANTUS SOLOSTAR) 100 UNIT/ML injection Inject 12 Units into the skin at bedtime.       . methotrexate (RHEUMATREX) 2.5 MG tablet Take 4 tablets by mouth 2 (two) times a week.      . metoprolol (LOPRESSOR) 50 MG tablet Take 50 mg by mouth 2 (two) times daily.        . nitroGLYCERIN (NITROSTAT) 0.4 MG SL tablet Place 0.4 mg under the tongue every 5 (five) minutes as needed. For chest pain      . predniSONE (STERAPRED UNI-PAK) 5 MG TABS Take 5 mg by mouth daily.      . simvastatin (ZOCOR) 40 MG tablet Take 40 mg by mouth every evening.        BP 126/66  Pulse 88  Temp 98.8 F (37.1 C) (Oral)  Wt 230 lb (104.327 kg)  SpO2 95%chart    Objective:   Physical Exam  Constitutional: He is oriented to person, place, and time. He appears well-developed and well-nourished.  HENT:  Right Ear: External ear normal.  Left Ear: External ear normal.  Nose: Nose normal.  Mouth/Throat: Oropharynx is clear and moist.  Eyes: Conjunctivae are normal. Pupils are equal, round, and reactive to light.  Neck: Normal range of motion. Neck supple. No thyromegaly present.  Cardiovascular: Normal rate, regular rhythm and normal heart sounds.   Pulmonary/Chest: Effort normal and breath sounds normal.  Abdominal: Soft. Bowel sounds are normal.  Musculoskeletal: Normal range of motion. He exhibits no edema and no tenderness.  Neurological: He is alert and oriented to person, place, and time.  Skin: Skin is warm and dry.          Assessment & Plan:  Assessment: Congestive heart failure-stable, rheumatoid arthritis, hypertension, hyperlipidemia, type 2 diabetes   Plan: The patient had labs drawn 06/05/2012. No additional labs need to be drawn today. I will bring patient back for recheck with his primary care provider in 3 months. Continue current medications. Bone density scan ordered today. I have recommended the patient has flu vaccine approximately one month from now to help covering for the flu season considering his history. Patient verbalizes understanding and will have his flu shot done late September early October. Call the office with any questions or concerns. Recheck as scheduled, and when necessary.

## 2012-06-13 ENCOUNTER — Telehealth: Payer: Self-pay | Admitting: Family

## 2012-06-13 NOTE — Telephone Encounter (Signed)
Cancel exam

## 2012-06-13 NOTE — Telephone Encounter (Signed)
Spoke with Armed forces logistics/support/administrative officer at [facility]. Order canceled

## 2012-06-13 NOTE — Telephone Encounter (Signed)
Per [facility] pt has order in [facility] for bone density scan with dx in [facility] insurance will not pay. Please change dx

## 2012-06-18 DIAGNOSIS — R269 Unspecified abnormalities of gait and mobility: Secondary | ICD-10-CM

## 2012-06-18 DIAGNOSIS — M069 Rheumatoid arthritis, unspecified: Secondary | ICD-10-CM

## 2012-06-18 DIAGNOSIS — M6281 Muscle weakness (generalized): Secondary | ICD-10-CM

## 2012-06-18 DIAGNOSIS — I509 Heart failure, unspecified: Secondary | ICD-10-CM

## 2012-07-25 ENCOUNTER — Other Ambulatory Visit: Payer: Self-pay | Admitting: Cardiovascular Disease

## 2012-07-30 ENCOUNTER — Other Ambulatory Visit: Payer: Self-pay | Admitting: Internal Medicine

## 2012-09-02 ENCOUNTER — Ambulatory Visit (INDEPENDENT_AMBULATORY_CARE_PROVIDER_SITE_OTHER): Payer: Medicare Other | Admitting: Family

## 2012-09-02 ENCOUNTER — Encounter: Payer: Self-pay | Admitting: Family

## 2012-09-02 VITALS — BP 134/74 | HR 103 | Temp 98.1°F | Wt 225.0 lb

## 2012-09-02 DIAGNOSIS — R413 Other amnesia: Secondary | ICD-10-CM

## 2012-09-02 DIAGNOSIS — E119 Type 2 diabetes mellitus without complications: Secondary | ICD-10-CM

## 2012-09-02 DIAGNOSIS — M109 Gout, unspecified: Secondary | ICD-10-CM

## 2012-09-02 DIAGNOSIS — I10 Essential (primary) hypertension: Secondary | ICD-10-CM

## 2012-09-02 DIAGNOSIS — M1990 Unspecified osteoarthritis, unspecified site: Secondary | ICD-10-CM

## 2012-09-02 DIAGNOSIS — Z79899 Other long term (current) drug therapy: Secondary | ICD-10-CM

## 2012-09-02 LAB — BASIC METABOLIC PANEL
BUN: 22 mg/dL (ref 6–23)
Calcium: 9 mg/dL (ref 8.4–10.5)
GFR: 51.8 mL/min — ABNORMAL LOW (ref >60.00)
Potassium: 3.6 mEq/L (ref 3.5–5.1)
Sodium: 137 mEq/L (ref 135–145)

## 2012-09-02 LAB — VITAMIN B12: Vitamin B-12: 568 pg/mL (ref 211–911)

## 2012-09-02 LAB — LIPID PANEL
Cholesterol: 96 mg/dL (ref 0–200)
VLDL: 26.6 mg/dL (ref 0.0–40.0)

## 2012-09-02 LAB — CBC WITH DIFFERENTIAL/PLATELET
Basophils Relative: 0.5 % (ref 0.0–3.0)
Eosinophils Absolute: 0.3 10*3/uL (ref 0.0–0.7)
Eosinophils Relative: 3.3 % (ref 0.0–5.0)
HCT: 35.8 % — ABNORMAL LOW (ref 39.0–52.0)
Lymphs Abs: 1.1 10*3/uL (ref 0.7–4.0)
MCHC: 32.3 g/dL (ref 30.0–36.0)
MCV: 92.1 fl (ref 78.0–100.0)
Monocytes Absolute: 0.5 10*3/uL (ref 0.1–1.0)
Neutro Abs: 7.6 10*3/uL (ref 1.4–7.7)
Neutrophils Relative %: 79 % — ABNORMAL HIGH (ref 43.0–77.0)
RBC: 3.89 Mil/uL — ABNORMAL LOW (ref 4.22–5.81)
WBC: 9.6 10*3/uL (ref 4.5–10.5)

## 2012-09-02 MED ORDER — MELOXICAM 7.5 MG PO TABS: 7.5000 mg | ORAL_TABLET | Freq: Every day | ORAL | Status: DC

## 2012-09-02 NOTE — Progress Notes (Signed)
Subjective:    Patient ID: Matthew Bean, male    DOB: 18-Nov-1932, 78 y.o.   MRN: [id number]  HPI 78 year old African American male, nonsmoker is in for recheck of type 2 diabetes, hypertension, hyperlipidemia, osteoarthritis. He's currently stable on his medications. His daughter is in today with concerns of memory loss. Reports have been often forgetful. But denies any wandering, getting loss, or misplacing items. Also reports an incident of his blood sugar dropping one day after taken insulin and not eating. EMS was called and blood sugar was stabalized.    Review of Systems  Constitutional: Negative.   HENT: Negative.   Eyes: Negative.   Respiratory: Negative.   Cardiovascular: Negative.   Gastrointestinal: Negative.   Genitourinary: Negative.  Negative for urgency and frequency.  Musculoskeletal: Positive for arthralgias.  Skin: Negative.   Neurological: Negative.   Hematological: Negative.   Psychiatric/Behavioral: Negative.    Past Medical History  Diagnosis Date  . Hyperlipidemia   . Diabetes mellitus   . CHF (congestive heart failure)   . Hypertension   . Myocardial infarction   . Nephropathy   . Renal insufficiency   . CAD (coronary artery disease)   . Pulmonary nodule   . Rheumatoid arthritis   . Hx of adenomatous colonic polyps     History   Social History  . Marital Status: Married    Spouse Name: N/A    Number of Children: 4  . Years of Education: N/A   Occupational History  . Retired [employer]    Social History Main Topics  . Smoking status: Former Smoker    Quit date: 02/06/1989  . Smokeless tobacco: Former Neurosurgeon    Quit date: 02/06/2006  . Alcohol Use: No  . Drug Use: No  . Sexually Active: Not on file   Other Topics Concern  . Not on file   Social History Narrative  . No narrative on file    Past Surgical History  Procedure Date  . Angioplasty 1997, 2008  . Coronary stent placement 2002    coronary  . Hernia repair    Inguinal and ventral  . Colonoscopy w/ polypectomy 02/20/2011    8 polyps removed, largest 1 cm, mix of adenomas, lymphoid aggregates and hyperplastic    Family History  Problem Relation Age of Onset  . Stroke Mother   . Stroke Father     No Known Allergies  Current Outpatient Prescriptions on File Prior to Visit  Medication Sig Dispense Refill  . acetic acid-hydrocortisone (VOSOL-HC) otic solution Place 3 drops into both ears as needed.        Marland Kitchen amLODipine (NORVASC) 10 MG tablet Take 10 mg by mouth daily.      Marland Kitchen amLODipine (NORVASC) 10 MG tablet TAKE ONE TABLET BY MOUTH EVERY DAY  30 tablet  5  . folic acid (FOLVITE) 1 MG tablet Take 1 mg by mouth daily.      Marland Kitchen glucose blood (ONE TOUCH TEST STRIPS) test strip 1 each by Other route as needed. Use as instructed       . glyBURIDE (DIABETA) 5 MG tablet Take 5 mg by mouth 2 (two) times daily with a meal.      . hydrochlorothiazide (HYDRODIURIL) 25 MG tablet Take 1 tablet by mouth daily.      . insulin glargine (LANTUS SOLOSTAR) 100 UNIT/ML injection Inject 12 Units into the skin at bedtime.       . methotrexate (RHEUMATREX) 2.5 MG tablet Take 4  tablets by mouth 2 (two) times a week.      . metoprolol (LOPRESSOR) 50 MG tablet Take 50 mg by mouth 2 (two) times daily.      . nitroGLYCERIN (NITROSTAT) 0.4 MG SL tablet Place 0.4 mg under the tongue every 5 (five) minutes as needed. For chest pain      . predniSONE (STERAPRED UNI-PAK) 5 MG TABS Take 5 mg by mouth daily.      Marland Kitchen RELION MINI PEN NEEDLES 31G X 6 MM MISC USE EVERY DAY  100 each  0  . simvastatin (ZOCOR) 40 MG tablet Take 40 mg by mouth every evening.      Marland Kitchen aspirin 81 MG chewable tablet Chew 81 mg by mouth daily.        BP 134/74  Pulse 103  Temp 98.1 F (36.7 C) (Oral)  Wt 225 lb (102.059 kg)chart    Objective:   Physical Exam  Constitutional: He is oriented to person, place, and time. He appears well-developed and well-nourished.  HENT:  Right Ear: External ear normal.    Left Ear: External ear normal.  Nose: Nose normal.  Mouth/Throat: Oropharynx is clear and moist.  Eyes: Conjunctivae normal are normal. Pupils are equal, round, and reactive to light.  Neck: Normal range of motion. Neck supple.  Cardiovascular: Normal rate, regular rhythm and normal heart sounds.   Pulmonary/Chest: Effort normal and breath sounds normal.  Abdominal: Soft. Bowel sounds are normal.  Musculoskeletal: Normal range of motion.  Neurological: He is alert and oriented to person, place, and time.  Skin: Skin is warm and dry.  Psychiatric: He has a normal mood and affect.      Clock Draw Test: Failed, unable to put the numbers in the correct place nor draw hands on the clock  Memory Recall: Failed, remember 0 items    Assessment & Plan:  Assessment: Type 2 Diabetes, Hypertension, Hyperlipidemia, Osteoarthritis, Memory loss  Plan: Labs sent. Will notify patient pending results. Mobic 7.5mg  once a day for osteoarthritis. See ortho as scheduled.

## 2012-09-02 NOTE — Patient Instructions (Addendum)
Dementia Dementia is a general term for problems with brain function. A person with dementia has memory loss and a hard time with at least one other brain function such as thinking, speaking, or problem solving. Dementia can affect social functioning, how you do your job, your mood, or your personality. The changes may be hidden for a long time. The earliest forms of this disease are usually not detected by family or friends. Dementia can be:  Irreversible.  Potentially reversible.  Partially reversible.  Progressive. This means it can get worse over time. CAUSES  Irreversible dementia causes may include:  Degeneration of brain cells (Alzheimer's disease or lewy body dementia).  Multiple small strokes (vascular dementia).  Infection (chronic meningitis or Creutzfelt-Jakob disease).  Frontotemporal dementia. This affects younger people, age 40 to 70, compared to those who have Alzheimer's disease.  Dementia associated with other disorders like Parkinson's disease, Huntington's disease, or HIV-associated dementia. Potentially or partially reversible dementia causes may include:  Medicines.  Metabolic causes such as excessive alcohol intake, vitamin B12 deficiency, or thyroid disease.  Masses or pressure in the brain such as a tumor, blood clot, or hydrocephalus. SYMPTOMS  Symptoms are often hard to detect. Family members or coworkers may not notice them early in the disease process. Different people with dementia may have different symptoms. Symptoms can include:  A hard time with memory, especially recent memory. Long-term memory may not be impaired.  Asking the same question multiple times or forgetting something someone just said.  A hard time speaking your thoughts or finding certain words.  A hard time solving problems or performing familiar tasks (such as how to use a telephone).  Sudden changes in mood.  Changes in personality, especially increasing moodiness or  mistrust.  Depression.  A hard time understanding complex ideas that were never a problem in the past. DIAGNOSIS  There are no specific tests for dementia.   Your caregiver may recommend a thorough evaluation. This is because some forms of dementia can be reversible. The evaluation will likely include a physical exam and getting a detailed history from you and a family member. The history often gives the best clues and suggestions for a diagnosis.  Memory testing may be done. A detailed brain function evaluation called neuropsychologic testing may be helpful.  Lab tests and brain imaging (such as a CT scan or MRI scan) are sometimes important.  Sometimes observation and re-evaluation over time is very helpful. TREATMENT  Treatment depends on the cause.   If the problem is a vitamin deficiency, it may be helped or cured with supplements.  For dementias such as Alzheimer's disease, medicines are available to stabilize or slow the course of the disease. There are no cures for this type of dementia.  Your caregiver can help direct you to groups, organizations, and other caregivers to help with decisions in the care of you or your loved one. HOME CARE INSTRUCTIONS The care of individuals with dementia is varied and dependent upon the progression of the dementia. The following suggestions are intended for the person living with, or caring for, the person with dementia.  Create a safe environment.  Remove the locks on bathroom doors to prevent the person from accidentally locking himself or herself in.  Use childproof latches on kitchen cabinets and any place where cleaning supplies, chemicals, or alcohol are kept.  Use childproof covers in unused electrical outlets.  Install childproof devices to keep doors and windows secured.  Remove stove knobs or install safety   knobs and an automatic shut-off on the stove.  Lower the temperature on water heaters.  Label medicines and keep them  locked up.  Secure knives, lighters, matches, power tools, and guns, and keep these items out of reach.  Keep the house free from clutter. Remove rugs or anything that might contribute to a fall.  Remove objects that might break and hurt the person.  Make sure lighting is good, both inside and outside.  Install grab rails as needed.  Use a monitoring device to alert you to falls or other needs for help.  Reduce confusion.  Keep familiar objects and people around.  Use night lights or dim lights at night.  Label items or areas.  Use reminders, notes, or directions for daily activities or tasks.  Keep a simple, consistent routine for waking, meals, bathing, dressing, and bedtime.  Create a calm, quiet environment.  Place large clocks and calendars prominently.  Display emergency numbers and home address near all telephones.  Use cues to establish different times of the day. An example is to open curtains to let the natural light in during the day.   Use effective communication.  Choose simple words and short sentences.  Use a gentle, calm tone of voice.  Be careful not to interrupt.  If the person is struggling to find a word or communicate a thought, try to provide the word or thought.  Ask one question at a time. Allow the person ample time to answer questions. Repeat the question again if the person does not respond.  Reduce nighttime restlessness.  Provide a comfortable bed.  Have a consistent nighttime routine.  Ensure a regular walking or physical activity schedule. Involve the person in daily activities as much as possible.  Limit napping during the day.  Limit caffeine.  Attend social events that stimulate rather than overwhelm the senses.  Encourage good nutrition and hydration.  Reduce distractions during meal times and snacks.  Avoid foods that are too hot or too cold.  Monitor chewing and swallowing ability.  Continue with routine vision,  hearing, dental, and medical screenings.  Only give over-the-counter or prescription medicines as directed by the caregiver.  Monitor driving abilities. Do not allow the person to drive when safe driving is no longer possible.  Register with an identification program which could provide location assistance in the event of a missing person situation. SEEK MEDICAL CARE IF:   New behavioral problems start such as moodiness, aggressiveness, or seeing things that are not there (hallucinations).  Any new problem with brain function happens. This includes problems with balance, speech, or falling a lot.  Problems with swallowing develop.  Any symptoms of other illness happen. Small changes or worsening in any aspect of brain function can be a sign that the illness is getting worse. It can also be a sign of another medical illness such as infection. Seeing a caregiver right away is important. SEEK IMMEDIATE MEDICAL CARE IF:   A fever develops.  New or worsened confusion develops.  New or worsened sleepiness develops.  Staying awake becomes hard to do. Document Released: 03/21/2001 Document Revised: 12/18/2011 Document Reviewed: 02/20/2011 [facility] Patient Information 2013 [place], [place].    Osteoarthritis Osteoarthritis is the most common form of arthritis. It is redness, soreness, and swelling (inflammation) affecting the cartilage. Cartilage acts as a cushion, covering the ends of bones where they meet to form a joint. CAUSES  Over time, the cartilage begins to wear away. This causes bone to rub  on bone. This produces pain and stiffness in the affected joints. Factors that contribute to this problem are:  Excessive body weight.  Age.  Overuse of joints. SYMPTOMS   People with osteoarthritis usually experience joint pain, swelling, or stiffness.  Over time, the joint may lose its normal shape.  Small deposits of bone (osteophytes) may grow on the edges of the joint.  Bits  of bone or cartilage can break off and float inside the joint space. This may cause more pain and damage.  Osteoarthritis can lead to depression, anxiety, feelings of helplessness, and limitations on daily activities. The most commonly affected joints are in the:  Ends of the fingers.  Thumbs.  Neck.  Lower back.  Knees.  Hips. DIAGNOSIS  Diagnosis is mostly based on your symptoms and exam. Tests may be helpful, including:  X-rays of the affected joint.  A computerized magnetic scan (MRI).  Blood tests to rule out other types of arthritis.  Joint fluid tests. This involves using a needle to draw fluid from the joint and examining the fluid under a microscope. TREATMENT  Goals of treatment are to control pain, improve joint function, maintain a normal body weight, and maintain a healthy lifestyle. Treatment approaches may include:  A prescribed exercise program with rest and joint relief.  Weight control with nutritional education.  Pain relief techniques such as:  Properly applied heat and cold.  Electric pulses delivered to nerve endings under the skin (transcutaneous electrical nerve stimulation, TENS).  Massage.  Certain supplements. Ask your caregiver before using any supplements, especially in combination with prescribed drugs.  Medicines to control pain, such as:  Acetaminophen.  Nonsteroidal anti-inflammatory drugs (NSAIDs), such as naproxen.  Narcotic or central-acting agents, such as tramadol. This drug carries a risk of addiction and is generally prescribed for short-term use.  Corticosteroids. These can be given orally or as injection. This is a short-term treatment, not recommended for routine use.  Surgery to reposition the bones and relieve pain (osteotomy) or to remove loose pieces of bone and cartilage. Joint replacement may be needed in advanced states of osteoarthritis. HOME CARE INSTRUCTIONS  Your caregiver can recommend specific types of  exercise. These may include:  Strengthening exercises. These are done to strengthen the muscles that support joints affected by arthritis. They can be performed with weights or with exercise bands to add resistance.  Aerobic activities. These are exercises, such as brisk walking or low-impact aerobics, that get your heart pumping. They can help keep your lungs and circulatory system in shape.  Range-of-motion activities. These keep your joints limber.  Balance and agility exercises. These help you maintain daily living skills. Learning about your condition and being actively involved in your care will help improve the course of your osteoarthritis. SEEK MEDICAL CARE IF:   You feel hot or your skin turns red.  You develop a rash in addition to your joint pain.  You have an oral temperature above 102 F (38.9 C). FOR MORE INFORMATION  National Institute of Arthritis and Musculoskeletal and Skin Diseases: www.niams.http://www.myers.net/ [employer] on Aging: https://walker.com/ American College of Rheumatology: www.rheumatology.org Document Released: 09/25/2005 Document Revised: 12/18/2011 Document Reviewed: 01/06/2010 [facility] Patient Information 2013 [place], [place].

## 2012-09-12 ENCOUNTER — Encounter: Payer: Self-pay | Admitting: Cardiovascular Disease

## 2012-09-12 ENCOUNTER — Ambulatory Visit (INDEPENDENT_AMBULATORY_CARE_PROVIDER_SITE_OTHER): Payer: Medicare Other | Admitting: Cardiovascular Disease

## 2012-09-12 VITALS — BP 120/68 | HR 80 | Resp 16 | Ht 72.0 in | Wt 228.0 lb

## 2012-09-12 DIAGNOSIS — I10 Essential (primary) hypertension: Secondary | ICD-10-CM

## 2012-09-12 DIAGNOSIS — I255 Ischemic cardiomyopathy: Secondary | ICD-10-CM

## 2012-09-12 DIAGNOSIS — I2510 Atherosclerotic heart disease of native coronary artery without angina pectoris: Secondary | ICD-10-CM

## 2012-09-12 DIAGNOSIS — I2589 Other forms of chronic ischemic heart disease: Secondary | ICD-10-CM

## 2012-09-12 NOTE — Progress Notes (Addendum)
History of Present Illness: 78 yo AAM with history of CAD, ischemic cardiomyopathy, HLD, DM, HTN, CRI who is here today for cardiac follow up. He has been followed in the past by Dr. Juanda Chance. He had remote stenting of the LAD and PTCA of the circumflex artery. In 2008 he had PTCA of the mid right coronary artery. It was a difficult procedure. The vessel was heavily calcified and a stent could not be delivered to the lesion. I met him in December 2012.   He tells me today that he has done well. He has had no chest pain, SOB or palpitations. No near syncope or syncope. He is planning on having his left eye surgery soon (cataracts) and has been holding ASA.   Primary Care Physician: Birdie Sons  Last Lipid Profile:Lipid Panel     Component Value Date/Time   CHOL 96 09/02/2012 0932   TRIG 133.0 09/02/2012 0932   HDL 27.30* 09/02/2012 0932   CHOLHDL 4 09/02/2012 0932   VLDL 26.6 09/02/2012 0932   LDLCALC 42 09/02/2012 0932     Past Medical History  Diagnosis Date  . Hyperlipidemia   . Diabetes mellitus   . CHF (congestive heart failure)   . Hypertension   . Myocardial infarction   . Nephropathy   . Renal insufficiency   . CAD (coronary artery disease)   . Pulmonary nodule   . Rheumatoid arthritis   . Hx of adenomatous colonic polyps     Past Surgical History  Procedure Date  . Angioplasty 1997, 2008  . Coronary stent placement 2002    coronary  . Hernia repair     Inguinal and ventral  . Colonoscopy w/ polypectomy 02/20/2011    8 polyps removed, largest 1 cm, mix of adenomas, lymphoid aggregates and hyperplastic    Current Outpatient Prescriptions  Medication Sig Dispense Refill  . amLODipine (NORVASC) 10 MG tablet Take 10 mg by mouth daily.      . folic acid (FOLVITE) 1 MG tablet Take 1 mg by mouth daily.      Marland Kitchen glucose blood (ONE TOUCH TEST STRIPS) test strip 1 each by Other route as needed. Use as instructed       . glyBURIDE (DIABETA) 5 MG tablet Take 5 mg by mouth  2 (two) times daily with a meal.      . hydrochlorothiazide (HYDRODIURIL) 25 MG tablet Take 1 tablet by mouth daily.      . insulin glargine (LANTUS SOLOSTAR) 100 UNIT/ML injection Inject 12 Units into the skin at bedtime.       . meloxicam (MOBIC) 7.5 MG tablet Take 1 tablet (7.5 mg total) by mouth daily.  30 tablet  3  . methotrexate (RHEUMATREX) 2.5 MG tablet Take 2.5 mg by mouth once a week.       . metoprolol (LOPRESSOR) 50 MG tablet Take 50 mg by mouth 2 (two) times daily.      . nitroGLYCERIN (NITROSTAT) 0.4 MG SL tablet Place 0.4 mg under the tongue every 5 (five) minutes as needed. For chest pain      . RELION MINI PEN NEEDLES 31G X 6 MM MISC USE EVERY DAY  100 each  0    No Known Allergies  History   Social History  . Marital Status: Married    Spouse Name: N/A    Number of Children: 4  . Years of Education: N/A   Occupational History  . Retired [employer]    Social History  Main Topics  . Smoking status: Former Smoker    Quit date: 02/06/1989  . Smokeless tobacco: Former Neurosurgeon    Quit date: 02/06/2006  . Alcohol Use: No  . Drug Use: No  . Sexually Active: Not on file   Other Topics Concern  . Not on file   Social History Narrative  . No narrative on file    Family History  Problem Relation Age of Onset  . Stroke Mother   . Stroke Father     Review of Systems:  As stated in the HPI and otherwise negative.   BP 120/68  Pulse 80  Resp 16  Ht 6' (1.829 m)  Wt 228 lb (103.42 kg)  BMI 30.92 kg/m2  Physical Examination: General: Well developed, well nourished, NAD HEENT: OP clear, mucus membranes moist SKIN: warm, dry. No rashes. Neuro: No focal deficits Musculoskeletal: Muscle strength 5/5 all ext Psychiatric: Mood and affect normal Neck: No JVD, no carotid bruits, no thyromegaly, no lymphadenopathy. Lungs:Clear bilaterally, no wheezes, rhonci, crackles Cardiovascular: Regular rate and rhythm. No murmurs, gallops or rubs. Abdomen:Soft. Bowel  sounds present. Non-tender.  Extremities: No lower extremity edema. Pulses are 2 + in the bilateral DP/PT.  Assessment and Plan:   1. CORONARY ARTERY DISEASE: Stable.  Continue current meds. BP well controlled. Lipids well controlled. ASA on hold for eye procedure.   2. Ischemic cardiomyopathy: Last LVEF 40% in 2008. No recent echo. Will arrange repeat echo. He is on a beta blocker but has not been treated with an ARB for his ICM secondary to renal insufficiency. Will discuss altering therapy after echo results available.   3. HTN: BP controlled.   4. Aortic stenosis: Mild by echo 2008. Reassess by repeat echo now.

## 2012-09-12 NOTE — Patient Instructions (Addendum)

## 2012-09-13 ENCOUNTER — Telehealth: Payer: Self-pay | Admitting: Cardiovascular Disease

## 2012-09-13 NOTE — Telephone Encounter (Signed)
New problem:   [employer] heart failure program  - office sending a weight scales.  The office will be sending a rx  MD to sign/ fax back

## 2012-09-17 ENCOUNTER — Other Ambulatory Visit: Payer: Self-pay | Admitting: Cardiovascular Disease

## 2012-09-17 ENCOUNTER — Ambulatory Visit (HOSPITAL_COMMUNITY): Payer: Medicare Other | Attending: Cardiovascular Disease | Admitting: Radiology

## 2012-09-17 DIAGNOSIS — I10 Essential (primary) hypertension: Secondary | ICD-10-CM | POA: Insufficient documentation

## 2012-09-17 DIAGNOSIS — E785 Hyperlipidemia, unspecified: Secondary | ICD-10-CM | POA: Insufficient documentation

## 2012-09-17 DIAGNOSIS — E669 Obesity, unspecified: Secondary | ICD-10-CM | POA: Insufficient documentation

## 2012-09-17 DIAGNOSIS — Z87891 Personal history of nicotine dependence: Secondary | ICD-10-CM | POA: Insufficient documentation

## 2012-09-17 DIAGNOSIS — I252 Old myocardial infarction: Secondary | ICD-10-CM | POA: Insufficient documentation

## 2012-09-17 DIAGNOSIS — I517 Cardiomegaly: Secondary | ICD-10-CM | POA: Insufficient documentation

## 2012-09-17 DIAGNOSIS — I5020 Unspecified systolic (congestive) heart failure: Secondary | ICD-10-CM | POA: Insufficient documentation

## 2012-09-17 DIAGNOSIS — I359 Nonrheumatic aortic valve disorder, unspecified: Secondary | ICD-10-CM | POA: Insufficient documentation

## 2012-09-17 DIAGNOSIS — I2589 Other forms of chronic ischemic heart disease: Secondary | ICD-10-CM | POA: Insufficient documentation

## 2012-09-17 DIAGNOSIS — E119 Type 2 diabetes mellitus without complications: Secondary | ICD-10-CM | POA: Insufficient documentation

## 2012-09-17 DIAGNOSIS — I2510 Atherosclerotic heart disease of native coronary artery without angina pectoris: Secondary | ICD-10-CM | POA: Insufficient documentation

## 2012-09-17 DIAGNOSIS — I255 Ischemic cardiomyopathy: Secondary | ICD-10-CM

## 2012-09-17 NOTE — Progress Notes (Signed)
Echocardiogram performed.  

## 2012-09-19 NOTE — Telephone Encounter (Signed)
I left message for Matthew Bean that we had not received prescription in office for MD to sign.  I left our fax number and phone number.

## 2012-09-25 ENCOUNTER — Telehealth: Payer: Self-pay | Admitting: Cardiovascular Disease

## 2012-09-25 NOTE — Telephone Encounter (Signed)
Calling regarding a monitor device information that was faxed on 12/11 and they where following up on that

## 2012-09-25 NOTE — Telephone Encounter (Signed)
Spoke with April and told her we had received information regarding the [employer] monitoring program and I would review with pt's MD

## 2012-09-25 NOTE — Telephone Encounter (Signed)
Paperwork completed and medical records will fax to [employer]

## 2012-09-25 NOTE — Telephone Encounter (Signed)
Left message for Matthew Bean that paperwork for [employer] monitoring program had been received in office. I asked her to call back to clarify this was the paperwork she had called about

## 2012-09-29 ENCOUNTER — Other Ambulatory Visit: Payer: Self-pay | Admitting: Internal Medicine

## 2012-10-08 NOTE — Telephone Encounter (Signed)
Paperwork for [employer] monitoring has been completed and faxed. (see phone note dated 12/18)

## 2012-10-15 ENCOUNTER — Telehealth: Payer: Self-pay | Admitting: Cardiovascular Disease

## 2012-10-15 NOTE — Telephone Encounter (Signed)
New Problem:    Called in wanting to know what the patient's latest ejection fraction was.  Please call back.

## 2012-10-15 NOTE — Telephone Encounter (Signed)
Pt is enrolled in [employer] monitoring program. I spoke with Delilah and gave her pt's EF from most recent echo

## 2012-10-16 ENCOUNTER — Other Ambulatory Visit: Payer: Self-pay | Admitting: Cardiovascular Disease

## 2012-10-17 ENCOUNTER — Other Ambulatory Visit: Payer: Self-pay | Admitting: *Deleted

## 2012-10-17 MED ORDER — METOPROLOL TARTRATE 50 MG PO TABS: 50.0000 mg | ORAL_TABLET | Freq: Two times a day (BID) | ORAL | Status: AC

## 2012-10-17 NOTE — Telephone Encounter (Signed)
Refilled Metoprolol

## 2013-01-01 ENCOUNTER — Ambulatory Visit (INDEPENDENT_AMBULATORY_CARE_PROVIDER_SITE_OTHER): Payer: Medicare Other | Admitting: Family

## 2013-01-01 ENCOUNTER — Encounter: Payer: Self-pay | Admitting: Family

## 2013-01-01 VITALS — BP 140/70 | HR 58 | Wt 222.0 lb

## 2013-01-01 DIAGNOSIS — I10 Essential (primary) hypertension: Secondary | ICD-10-CM

## 2013-01-01 DIAGNOSIS — Z720 Tobacco use: Secondary | ICD-10-CM

## 2013-01-01 DIAGNOSIS — I25810 Atherosclerosis of coronary artery bypass graft(s) without angina pectoris: Secondary | ICD-10-CM

## 2013-01-01 DIAGNOSIS — M109 Gout, unspecified: Secondary | ICD-10-CM

## 2013-01-01 DIAGNOSIS — E119 Type 2 diabetes mellitus without complications: Secondary | ICD-10-CM

## 2013-01-01 DIAGNOSIS — R413 Other amnesia: Secondary | ICD-10-CM

## 2013-01-01 DIAGNOSIS — F17200 Nicotine dependence, unspecified, uncomplicated: Secondary | ICD-10-CM

## 2013-01-01 DIAGNOSIS — E7800 Pure hypercholesterolemia, unspecified: Secondary | ICD-10-CM

## 2013-01-01 LAB — LIPID PANEL
Cholesterol: 136 mg/dL (ref 0–200)
HDL: 49.7 mg/dL (ref >39.00)
LDL Cholesterol: 58 mg/dL (ref 0–99)
Total CHOL/HDL Ratio: 3
Triglycerides: 140 mg/dL (ref 0.0–149.0)
VLDL: 28 mg/dL (ref 0.0–40.0)

## 2013-01-01 LAB — CBC WITH DIFFERENTIAL/PLATELET
Basophils Absolute: 0.1 10*3/uL (ref 0.0–0.1)
Eosinophils Absolute: 0.3 10*3/uL (ref 0.0–0.7)
Lymphocytes Relative: 18.9 % (ref 12.0–46.0)
MCHC: 32.9 g/dL (ref 30.0–36.0)
Monocytes Relative: 9.7 % (ref 3.0–12.0)
Neutro Abs: 6.6 10*3/uL (ref 1.4–7.7)
Neutrophils Relative %: 67.3 % (ref 43.0–77.0)
Platelets: 116 10*3/uL — ABNORMAL LOW (ref 150.0–400.0)
RDW: 16.3 % — ABNORMAL HIGH (ref 11.5–14.6)

## 2013-01-01 LAB — TSH: TSH: 2.07 u[IU]/mL (ref 0.35–5.50)

## 2013-01-01 LAB — BASIC METABOLIC PANEL
CO2: 28 mEq/L (ref 19–32)
Chloride: 101 mEq/L (ref 96–112)
Creatinine, Ser: 1.5 mg/dL (ref 0.4–1.5)
Potassium: 3.6 mEq/L (ref 3.5–5.1)

## 2013-01-01 LAB — VITAMIN B12: Vitamin B-12: 734 pg/mL (ref 211–911)

## 2013-01-01 LAB — HEMOGLOBIN A1C: Hgb A1c MFr Bld: 8 % — ABNORMAL HIGH (ref 4.6–6.5)

## 2013-01-01 LAB — URIC ACID: Uric Acid, Serum: 5.8 mg/dL (ref 4.0–7.8)

## 2013-01-01 MED ORDER — MELOXICAM 7.5 MG PO TABS: 7.5000 mg | ORAL_TABLET | Freq: Every day | ORAL | Status: DC

## 2013-01-01 NOTE — Progress Notes (Signed)
Subjective:    Patient ID: Matthew Bean, male    DOB: 13-Apr-1933, 79 y.o.   MRN: [id number]  HPI  79 year old African American male, nonsmoker is in with his daughter today for recheck of type 2 diabetes, hypertension, hyperlipidemia, osteoarthritis, and coronary artery disease. He has concerns that his arthritis is worsening in his knees. He had taken Mobic 7-1/2 mg once daily in the past that worked effectively but he's been out of the medication. His daughter also has concerns that he is unable to remember things. Has concerns that he is misplacing items, wandering and not sure of how to get back home. They are unsure of any dementia or Alzheimer's in the family because he is the oldest living relatives. Denies any feelings of helplessness, hopelessness, thoughts of death or dying.  Review of Systems  Constitutional: Negative.   HENT: Negative.   Eyes: Negative.   Respiratory: Negative.   Cardiovascular: Negative.   Gastrointestinal: Negative.   Endocrine: Negative.   Genitourinary: Negative.   Musculoskeletal: Positive for arthralgias.       Bilateral knee pain  Skin: Negative.   Allergic/Immunologic: Negative.   Neurological: Negative.   Hematological: Negative.   Psychiatric/Behavioral: Positive for confusion, decreased concentration and agitation.       Memory disturbances   Past Medical History  Diagnosis Date  . Hyperlipidemia   . Diabetes mellitus   . CHF (congestive heart failure)   . Hypertension   . Myocardial infarction   . Nephropathy   . Renal insufficiency   . CAD (coronary artery disease)   . Pulmonary nodule   . Rheumatoid arthritis   . Hx of adenomatous colonic polyps     History   Social History  . Marital Status: Married    Spouse Name: N/A    Number of Children: 4  . Years of Education: N/A   Occupational History  . Retired [employer]    Social History Main Topics  . Smoking status: Former Smoker    Quit date: 02/06/1989  .  Smokeless tobacco: Former Neurosurgeon    Quit date: 02/06/2006  . Alcohol Use: No  . Drug Use: No  . Sexually Active: Not on file   Other Topics Concern  . Not on file   Social History Narrative  . No narrative on file    Past Surgical History  Procedure Laterality Date  . Angioplasty  1997, 2008  . Coronary stent placement  2002    coronary  . Hernia repair      Inguinal and ventral  . Colonoscopy w/ polypectomy  02/20/2011    8 polyps removed, largest 1 cm, mix of adenomas, lymphoid aggregates and hyperplastic    Family History  Problem Relation Age of Onset  . Stroke Mother   . Stroke Father     No Known Allergies  Current Outpatient Prescriptions on File Prior to Visit  Medication Sig Dispense Refill  . amLODipine (NORVASC) 10 MG tablet Take 10 mg by mouth daily.      . folic acid (FOLVITE) 1 MG tablet Take 1 mg by mouth daily.      Marland Kitchen glucose blood (ONE TOUCH TEST STRIPS) test strip 1 each by Other route as needed. Use as instructed      . glyBURIDE (DIABETA) 5 MG tablet Take 5 mg by mouth 2 (two) times daily with a meal.      . hydrochlorothiazide (HYDRODIURIL) 25 MG tablet Take 1 tablet by mouth daily.      Marland Kitchen  hydrochlorothiazide (HYDRODIURIL) 25 MG tablet TAKE ONE TABLET BY MOUTH EVERY DAY  30 tablet  5  . insulin glargine (LANTUS SOLOSTAR) 100 UNIT/ML injection Inject 12 Units into the skin at bedtime.       . methotrexate (RHEUMATREX) 2.5 MG tablet Take 2.5 mg by mouth once a week.       . metoprolol (LOPRESSOR) 50 MG tablet TAKE ONE TABLET BY MOUTH TWICE DAILY  60 tablet  10  . metoprolol (LOPRESSOR) 50 MG tablet Take 1 tablet (50 mg total) by mouth 2 (two) times daily.  60 tablet  3  . nitroGLYCERIN (NITROSTAT) 0.4 MG SL tablet Place 0.4 mg under the tongue every 5 (five) minutes as needed. For chest pain      . RELION MINI PEN NEEDLES 31G X 6 MM MISC USE EVERY DAY  100 each  0  . simvastatin (ZOCOR) 40 MG tablet TAKE ONE TABLET BY MOUTH EVERY DAY AT BEDTIME  180  tablet  0   No current facility-administered medications on file prior to visit.    BP 140/70  Pulse 58  Wt 222 lb (100.699 kg)  BMI 30.1 kg/m2  SpO2 97%chart    Objective:   Physical Exam  Constitutional: He is oriented to person, place, and time. He appears well-developed and well-nourished.  HENT:  Right Ear: External ear normal.  Left Ear: External ear normal.  Nose: Nose normal.  Mouth/Throat: Oropharynx is clear and moist.  Neck: Normal range of motion. Neck supple. No thyromegaly present.  Cardiovascular: Normal rate, regular rhythm and normal heart sounds.   Pulmonary/Chest: Effort normal and breath sounds normal.  Abdominal: Soft. Bowel sounds are normal.  Musculoskeletal: Normal range of motion.  Neurological: He is alert and oriented to person, place, and time.  Skin: Skin is warm and dry.  Psychiatric: He has a normal mood and affect.  Clock draw test: unable to perform. Only drew circle, no numbers.  Memory recall: 1/3          Assessment & Plan:  Assessment: 1. Memory disturbance 2. Type 2 diabetes 3. Hyperlipidemia 4. Osteoarthritis 5. Coronary artery disease  Plan: Lab tests  to rule out vitamin deficiencies. patient obviously has memory disturbances likely related to dementia. When his labs return, if they are normal we will order a CT scan of the he had without contrast to rule out any other source of memory disturbances. Continue current medications. Recheck patient in 3-4 months and sooner as needed. Refilled Mobic

## 2013-01-01 NOTE — Patient Instructions (Signed)
Dementia Dementia is a general term for problems with brain function. A person with dementia has memory loss and a hard time with at least one other brain function such as thinking, speaking, or problem solving. Dementia can affect social functioning, how you do your job, your mood, or your personality. The changes may be hidden for a long time. The earliest forms of this disease are usually not detected by family or friends. Dementia can be:  Irreversible.  Potentially reversible.  Partially reversible.  Progressive. This means it can get worse over time. CAUSES  Irreversible dementia causes may include:  Degeneration of brain cells (Alzheimer's disease or lewy body dementia).  Multiple small strokes (vascular dementia).  Infection (chronic meningitis or Creutzfelt-Jakob disease).  Frontotemporal dementia. This affects younger people, age 40 to 70, compared to those who have Alzheimer's disease.  Dementia associated with other disorders like Parkinson's disease, Huntington's disease, or HIV-associated dementia. Potentially or partially reversible dementia causes may include:  Medicines.  Metabolic causes such as excessive alcohol intake, vitamin B12 deficiency, or thyroid disease.  Masses or pressure in the brain such as a tumor, blood clot, or hydrocephalus. SYMPTOMS  Symptoms are often hard to detect. Family members or coworkers may not notice them early in the disease process. Different people with dementia may have different symptoms. Symptoms can include:  A hard time with memory, especially recent memory. Long-term memory may not be impaired.  Asking the same question multiple times or forgetting something someone just said.  A hard time speaking your thoughts or finding certain words.  A hard time solving problems or performing familiar tasks (such as how to use a telephone).  Sudden changes in mood.  Changes in personality, especially increasing moodiness or  mistrust.  Depression.  A hard time understanding complex ideas that were never a problem in the past. DIAGNOSIS  There are no specific tests for dementia.   Your caregiver may recommend a thorough evaluation. This is because some forms of dementia can be reversible. The evaluation will likely include a physical exam and getting a detailed history from you and a family member. The history often gives the best clues and suggestions for a diagnosis.  Memory testing may be done. A detailed brain function evaluation called neuropsychologic testing may be helpful.  Lab tests and brain imaging (such as a CT scan or MRI scan) are sometimes important.  Sometimes observation and re-evaluation over time is very helpful. TREATMENT  Treatment depends on the cause.   If the problem is a vitamin deficiency, it may be helped or cured with supplements.  For dementias such as Alzheimer's disease, medicines are available to stabilize or slow the course of the disease. There are no cures for this type of dementia.  Your caregiver can help direct you to groups, organizations, and other caregivers to help with decisions in the care of you or your loved one. HOME CARE INSTRUCTIONS The care of individuals with dementia is varied and dependent upon the progression of the dementia. The following suggestions are intended for the person living with, or caring for, the person with dementia.  Create a safe environment.  Remove the locks on bathroom doors to prevent the person from accidentally locking himself or herself in.  Use childproof latches on kitchen cabinets and any place where cleaning supplies, chemicals, or alcohol are kept.  Use childproof covers in unused electrical outlets.  Install childproof devices to keep doors and windows secured.  Remove stove knobs or install safety   Use childproof latches on kitchen cabinets and any place where cleaning supplies, chemicals, or alcohol are kept.   Use childproof covers in unused electrical outlets.   Install childproof devices to keep doors and windows secured.   Remove stove knobs or install safety knobs and an automatic shut-off on the stove.   Lower the temperature on water heaters.   Label medicines and keep them  locked up.   Secure knives, lighters, matches, power tools, and guns, and keep these items out of reach.   Keep the house free from clutter. Remove rugs or anything that might contribute to a fall.   Remove objects that might break and hurt the person.   Make sure lighting is good, both inside and outside.   Install grab rails as needed.   Use a monitoring device to alert you to falls or other needs for help.   Reduce confusion.   Keep familiar objects and people around.   Use night lights or dim lights at night.   Label items or areas.   Use reminders, notes, or directions for daily activities or tasks.   Keep a simple, consistent routine for waking, meals, bathing, dressing, and bedtime.   Create a calm, quiet environment.   Place large clocks and calendars prominently.   Display emergency numbers and home address near all telephones.   Use cues to establish different times of the day. An example is to open curtains to let the natural light in during the day.    Use effective communication.   Choose simple words and short sentences.   Use a gentle, calm tone of voice.   Be careful not to interrupt.   If the person is struggling to find a word or communicate a thought, try to provide the word or thought.   Ask one question at a time. Allow the person ample time to answer questions. Repeat the question again if the person does not respond.   Reduce nighttime restlessness.   Provide a comfortable bed.   Have a consistent nighttime routine.   Ensure a regular walking or physical activity schedule. Involve the person in daily activities as much as possible.   Limit napping during the day.   Limit caffeine.   Attend social events that stimulate rather than overwhelm the senses.   Encourage good nutrition and hydration.   Reduce distractions during meal times and snacks.   Avoid foods that are too hot or too cold.   Monitor chewing and swallowing ability.   Continue with routine vision,  hearing, dental, and medical screenings.   Only give over-the-counter or prescription medicines as directed by the caregiver.   Monitor driving abilities. Do not allow the person to drive when safe driving is no longer possible.   Register with an identification program which could provide location assistance in the event of a missing person situation.  SEEK MEDICAL CARE IF:    New behavioral problems start such as moodiness, aggressiveness, or seeing things that are not there (hallucinations).   Any new problem with brain function happens. This includes problems with balance, speech, or falling a lot.   Problems with swallowing develop.   Any symptoms of other illness happen.  Small changes or worsening in any aspect of brain function can be a sign that the illness is getting worse. It can also be a sign of another medical illness such as infection. Seeing a caregiver right away is important.  SEEK IMMEDIATE MEDICAL CARE IF:   

## 2013-01-20 ENCOUNTER — Other Ambulatory Visit: Payer: Self-pay | Admitting: Internal Medicine

## 2013-01-22 ENCOUNTER — Other Ambulatory Visit: Payer: Self-pay | Admitting: Cardiovascular Disease

## 2013-01-22 ENCOUNTER — Ambulatory Visit (INDEPENDENT_AMBULATORY_CARE_PROVIDER_SITE_OTHER): Payer: Medicare Other | Admitting: Family

## 2013-01-22 ENCOUNTER — Encounter: Payer: Self-pay | Admitting: Family

## 2013-01-22 VITALS — BP 134/78 | HR 67 | Wt 226.0 lb

## 2013-01-22 DIAGNOSIS — E1159 Type 2 diabetes mellitus with other circulatory complications: Secondary | ICD-10-CM

## 2013-01-22 DIAGNOSIS — F0789 Other personality and behavioral disorders due to known physiological condition: Secondary | ICD-10-CM

## 2013-01-22 DIAGNOSIS — R413 Other amnesia: Secondary | ICD-10-CM

## 2013-01-22 DIAGNOSIS — I10 Essential (primary) hypertension: Secondary | ICD-10-CM

## 2013-01-22 DIAGNOSIS — R609 Edema, unspecified: Secondary | ICD-10-CM

## 2013-01-22 MED ORDER — MEMANTINE HCL 5 MG PO TABS: 5.0000 mg | ORAL_TABLET | Freq: Two times a day (BID) | ORAL | Status: DC

## 2013-01-22 MED ORDER — FUROSEMIDE 20 MG PO TABS: 20.0000 mg | ORAL_TABLET | Freq: Every day | ORAL | Status: DC | PRN

## 2013-01-22 NOTE — Patient Instructions (Addendum)
1. Increase Lantus by 2 units every 3 days for a fasting blood sugar greater than 120.   Peripheral Edema You have swelling in your legs (peripheral edema). This swelling is due to excess accumulation of salt and water in your body. Edema may be a sign of heart, kidney or liver disease, or a side effect of a medication. It may also be due to problems in the leg veins. Elevating your legs and using special support stockings may be very helpful, if the cause of the swelling is due to poor venous circulation. Avoid long periods of standing, whatever the cause. Treatment of edema depends on identifying the cause. Chips, pretzels, pickles and other salty foods should be avoided. Restricting salt in your diet is almost always needed. Water pills (diuretics) are often used to remove the excess salt and water from your body via urine. These medicines prevent the kidney from reabsorbing sodium. This increases urine flow. Diuretic treatment may also result in lowering of potassium levels in your body. Potassium supplements may be needed if you have to use diuretics daily. Daily weights can help you keep track of your progress in clearing your edema. You should call your caregiver for follow up care as recommended. SEEK IMMEDIATE MEDICAL CARE IF:   You have increased swelling, pain, redness, or heat in your legs.  You develop shortness of breath, especially when lying down.  You develop chest or abdominal pain, weakness, or fainting.  You have a fever. Document Released: 11/02/2004 Document Revised: 12/18/2011 Document Reviewed: 10/13/2009 [facility] Patient Information 2013 [place], [place].

## 2013-01-22 NOTE — Progress Notes (Signed)
Subjective:    Patient ID: Matthew Bean, male    DOB: Oct 16, 1932, 79 y.o.   MRN: [id number]  HPI 79 year old [place] American male, nonsmoker, patient of Dr. Cato Mulligan is in for recheck of type 2 diabetes uncontrolled. Patient reports not getting the message to increase his Lantus. Does not routinely check his blood sugars. Therefore continues to take 12 units in the evenings. He has concerns of increased swelling to his lower extremities and has had a 3 pound weight gain. Does not routinely take Lasix.   His family continues to grow concern with his memory loss and is requesting medication to help slow down the progression of his dementia.   Review of Systems  Constitutional: Negative.   HENT: Negative.   Respiratory: Negative.  Negative for shortness of breath.   Cardiovascular: Positive for leg swelling. Negative for chest pain and palpitations.  Gastrointestinal: Negative.   Endocrine: Negative.  Negative for polydipsia, polyphagia and polyuria.  Genitourinary: Negative.   Musculoskeletal: Negative.   Skin: Negative.   Neurological: Negative.   Hematological: Negative.   Psychiatric/Behavioral: Positive for confusion and agitation. Negative for hallucinations and self-injury. The patient is not nervous/anxious.        Memory disturbance   Past Medical History  Diagnosis Date  . Hyperlipidemia   . Diabetes mellitus   . CHF (congestive heart failure)   . Hypertension   . Myocardial infarction   . Nephropathy   . Renal insufficiency   . CAD (coronary artery disease)   . Pulmonary nodule   . Rheumatoid arthritis   . Hx of adenomatous colonic polyps     History   Social History  . Marital Status: Married    Spouse Name: N/A    Number of Children: 4  . Years of Education: N/A   Occupational History  . Retired [employer]    Social History Main Topics  . Smoking status: Former Smoker    Quit date: 02/06/1989  . Smokeless tobacco: Former Neurosurgeon    Quit date:  02/06/2006  . Alcohol Use: No  . Drug Use: No  . Sexually Active: Not on file   Other Topics Concern  . Not on file   Social History Narrative  . No narrative on file    Past Surgical History  Procedure Laterality Date  . Angioplasty  1997, 2008  . Coronary stent placement  2002    coronary  . Hernia repair      Inguinal and ventral  . Colonoscopy w/ polypectomy  02/20/2011    8 polyps removed, largest 1 cm, mix of adenomas, lymphoid aggregates and hyperplastic    Family History  Problem Relation Age of Onset  . Stroke Mother   . Stroke Father     No Known Allergies  Current Outpatient Prescriptions on File Prior to Visit  Medication Sig Dispense Refill  . amLODipine (NORVASC) 10 MG tablet Take 10 mg by mouth daily.      Marland Kitchen aspirin 81 MG tablet Take 81 mg by mouth daily.      . folic acid (FOLVITE) 1 MG tablet Take 1 mg by mouth daily.      Marland Kitchen glucose blood (ONE TOUCH TEST STRIPS) test strip 1 each by Other route as needed. Use as instructed      . glyBURIDE (DIABETA) 5 MG tablet Take 5 mg by mouth 2 (two) times daily with a meal.      . hydrochlorothiazide (HYDRODIURIL) 25 MG tablet Take 1 tablet  by mouth daily.      . hydrochlorothiazide (HYDRODIURIL) 25 MG tablet TAKE ONE TABLET BY MOUTH EVERY DAY  30 tablet  5  . insulin glargine (LANTUS SOLOSTAR) 100 UNIT/ML injection Inject 12 Units into the skin at bedtime.       . meloxicam (MOBIC) 7.5 MG tablet Take 1 tablet (7.5 mg total) by mouth daily.  30 tablet  5  . methotrexate (RHEUMATREX) 2.5 MG tablet Take 2.5 mg by mouth once a week.       . metoprolol (LOPRESSOR) 50 MG tablet TAKE ONE TABLET BY MOUTH TWICE DAILY  60 tablet  10  . metoprolol (LOPRESSOR) 50 MG tablet Take 1 tablet (50 mg total) by mouth 2 (two) times daily.  60 tablet  3  . nitroGLYCERIN (NITROSTAT) 0.4 MG SL tablet Place 0.4 mg under the tongue every 5 (five) minutes as needed. For chest pain      . RELION MINI PEN NEEDLES 31G X 6 MM MISC USE EVERY DAY   100 each  0  . simvastatin (ZOCOR) 40 MG tablet TAKE ONE TABLET BY MOUTH EVERY DAY AT BEDTIME  180 tablet  0  . VOLTAREN 1 % GEL APPLY GEL TOPICALLY TWICE DAILY TO WRIST FOR 7 DAYS  100 g  0   No current facility-administered medications on file prior to visit.    BP 134/78  Pulse 67  Wt 226 lb (102.513 kg)  BMI 30.64 kg/m2  SpO2 96%chart    Objective:   Physical Exam  Constitutional: He is oriented to person, place, and time. He appears well-developed and well-nourished.  HENT:  Right Ear: External ear normal.  Left Ear: External ear normal.  Nose: Nose normal.  Mouth/Throat: Oropharynx is clear and moist.  Neck: Neck supple.  Cardiovascular: Normal rate, regular rhythm and normal heart sounds.   Pulmonary/Chest: Effort normal and breath sounds normal.  Abdominal: Soft. Bowel sounds are normal.  Musculoskeletal: He exhibits edema. He exhibits no tenderness.  2+  Neurological: He is alert and oriented to person, place, and time.  Skin: Skin is warm and dry.  Psychiatric: He has a normal mood and affect.          Assessment & Plan:  Assessment:  1. Type 2 Diabetes-uncontrolled 2. Dementia 3. Hypercholesterolemia   Plan: Start Namenda 5mg  once daily. Increase Lantus to 14 units. Increase by 2 units every 3 days for a fasting blood sugar greater than 120. Continue current medications.

## 2013-01-26 ENCOUNTER — Other Ambulatory Visit: Payer: Self-pay | Admitting: Internal Medicine

## 2013-01-27 ENCOUNTER — Encounter: Payer: Self-pay | Admitting: *Deleted

## 2013-04-02 ENCOUNTER — Other Ambulatory Visit: Payer: Self-pay | Admitting: Cardiovascular Disease

## 2013-04-17 ENCOUNTER — Other Ambulatory Visit: Payer: Self-pay | Admitting: Internal Medicine

## 2013-04-23 ENCOUNTER — Ambulatory Visit (INDEPENDENT_AMBULATORY_CARE_PROVIDER_SITE_OTHER): Payer: Medicare Other | Admitting: Internal Medicine

## 2013-04-23 ENCOUNTER — Encounter: Payer: Self-pay | Admitting: Internal Medicine

## 2013-04-23 VITALS — BP 126/68 | HR 64 | Temp 97.6°F | Wt 213.0 lb

## 2013-04-23 DIAGNOSIS — E1165 Type 2 diabetes mellitus with hyperglycemia: Secondary | ICD-10-CM

## 2013-04-23 DIAGNOSIS — I2510 Atherosclerotic heart disease of native coronary artery without angina pectoris: Secondary | ICD-10-CM

## 2013-04-23 DIAGNOSIS — E1159 Type 2 diabetes mellitus with other circulatory complications: Secondary | ICD-10-CM

## 2013-04-23 DIAGNOSIS — E118 Type 2 diabetes mellitus with unspecified complications: Secondary | ICD-10-CM

## 2013-04-23 DIAGNOSIS — IMO0002 Reserved for concepts with insufficient information to code with codable children: Secondary | ICD-10-CM

## 2013-04-23 DIAGNOSIS — N259 Disorder resulting from impaired renal tubular function, unspecified: Secondary | ICD-10-CM

## 2013-04-23 LAB — BASIC METABOLIC PANEL
CO2: 29 mEq/L (ref 19–32)
Chloride: 103 mEq/L (ref 96–112)
Glucose, Bld: 76 mg/dL (ref 70–99)
Potassium: 3.6 mEq/L (ref 3.5–5.1)
Sodium: 140 mEq/L (ref 135–145)

## 2013-04-23 LAB — CBC
Hemoglobin: 13.6 g/dL (ref 13.0–17.0)
MCHC: 32.6 g/dL (ref 30.0–36.0)
RDW: 15.4 % — ABNORMAL HIGH (ref 11.5–14.6)
WBC: 7.5 10*3/uL (ref 4.5–10.5)

## 2013-04-23 LAB — HEMOGLOBIN A1C: Hgb A1c MFr Bld: 6 % (ref 4.6–6.5)

## 2013-04-23 LAB — LIPID PANEL
HDL: 32.6 mg/dL — ABNORMAL LOW (ref >39.00)
LDL Cholesterol: 44 mg/dL (ref 0–99)
Total CHOL/HDL Ratio: 3
Triglycerides: 122 mg/dL (ref 0.0–149.0)
VLDL: 24.4 mg/dL (ref 0.0–40.0)

## 2013-04-23 LAB — HEPATIC FUNCTION PANEL
Albumin: 3.9 g/dL (ref 3.5–5.2)
Total Bilirubin: 0.6 mg/dL (ref 0.3–1.2)

## 2013-04-23 NOTE — Progress Notes (Signed)
Patient ID: Matthew Bean, male   DOB: 19-Apr-1933, 79 y.o.   MRN: [id number]  Cad-- no sxs  RA- followed by rheumatology  DM- home CBGs in the a.m = 90s-120  Reviewed pmh, psh, meds   patient denies chest pain, shortness of breath, orthopnea. Denies lower extremity edema, abdominal pain, change in appetite, change in bowel movements. Patient denies rashes, musculoskeletal complaints. No other specific complaints in a complete review of systems.    well-developed well-nourished male in no acute distress. HEENT exam atraumatic, normocephalic, neck supple without jugular venous distention. Chest clear to auscultation cardiac exam S1-S2 are regular. Abdominal exam overweight with bowel sounds, soft and nontender. Extremities no edema. Neurologic exam is alert with a normal gait.

## 2013-04-25 NOTE — Assessment & Plan Note (Signed)
Will check labs today. He has significant central obesity. He should exercise daily and lose weight.

## 2013-04-25 NOTE — Assessment & Plan Note (Signed)
Chronic problem. We'll check labs today.

## 2013-04-25 NOTE — Assessment & Plan Note (Signed)
No symptoms of chest pain. Will continue risk factor modification.

## 2013-04-26 ENCOUNTER — Other Ambulatory Visit: Payer: Self-pay | Admitting: Internal Medicine

## 2013-05-09 ENCOUNTER — Other Ambulatory Visit: Payer: Self-pay | Admitting: Cardiovascular Disease

## 2013-05-19 ENCOUNTER — Other Ambulatory Visit: Payer: Self-pay | Admitting: Family

## 2013-05-21 ENCOUNTER — Telehealth: Payer: Self-pay | Admitting: Internal Medicine

## 2013-05-21 NOTE — Telephone Encounter (Signed)
Pt rx for NAMENDA 5 MG tablet instructions states  TAKE ONE TABLET BY MOUTH TWICE DAILY But pt only gets 30 pills each time.  Can you change this RX accordingly. Pharm [employer]

## 2013-05-22 ENCOUNTER — Other Ambulatory Visit: Payer: Self-pay | Admitting: *Deleted

## 2013-05-22 MED ORDER — MEMANTINE HCL 5 MG PO TABS: ORAL_TABLET | ORAL | Status: DC

## 2013-05-22 NOTE — Telephone Encounter (Signed)
This was done.

## 2013-05-28 ENCOUNTER — Other Ambulatory Visit: Payer: Self-pay | Admitting: Internal Medicine

## 2013-06-06 ENCOUNTER — Telehealth: Payer: Self-pay | Admitting: Cardiovascular Disease

## 2013-06-06 NOTE — Telephone Encounter (Signed)
Called patient's wife . She states that the case manager nurse called to set up an appointment because patient was not sure what meds he was taking and might have gained weight but was not sure because there is no scale in the house. She states that he is not SOB walking in the house but he is SOB walking to the mailbox. Does not think that he is taking Lasix anymore. Advised her to bring all the medication bottles to the visit with Norma Fredrickson NP next week.

## 2013-06-06 NOTE — Telephone Encounter (Signed)
New problem   Wife called pt has not taken cardicac meds correctly, 7 or more lbs of recent weight gain// does not have a working scale// appt has been made for 9/2 @ 9:30.

## 2013-06-10 ENCOUNTER — Encounter: Payer: Self-pay | Admitting: Nurse Practitioner

## 2013-06-10 ENCOUNTER — Ambulatory Visit (INDEPENDENT_AMBULATORY_CARE_PROVIDER_SITE_OTHER): Payer: Medicare Other | Admitting: Nurse Practitioner

## 2013-06-10 VITALS — BP 150/70 | HR 56 | Ht 73.0 in | Wt 220.4 lb

## 2013-06-10 DIAGNOSIS — I2589 Other forms of chronic ischemic heart disease: Secondary | ICD-10-CM

## 2013-06-10 DIAGNOSIS — I255 Ischemic cardiomyopathy: Secondary | ICD-10-CM

## 2013-06-10 LAB — CBC WITH DIFFERENTIAL/PLATELET
Basophils Absolute: 0.1 10*3/uL (ref 0.0–0.1)
Basophils Relative: 0.7 % (ref 0.0–3.0)
Eosinophils Absolute: 0.7 10*3/uL (ref 0.0–0.7)
Eosinophils Relative: 7.8 % — ABNORMAL HIGH (ref 0.0–5.0)
HCT: 39 % (ref 39.0–52.0)
Hemoglobin: 12.7 g/dL — ABNORMAL LOW (ref 13.0–17.0)
Lymphocytes Relative: 15.1 % (ref 12.0–46.0)
Lymphs Abs: 1.4 10*3/uL (ref 0.7–4.0)
MCHC: 32.6 g/dL (ref 30.0–36.0)
MCV: 93.9 fl (ref 78.0–100.0)
Monocytes Absolute: 0.7 10*3/uL (ref 0.1–1.0)
Monocytes Relative: 7.8 % (ref 3.0–12.0)
Neutro Abs: 6.2 10*3/uL (ref 1.4–7.7)
Neutrophils Relative %: 68.6 % (ref 43.0–77.0)
Platelets: 142 10*3/uL — ABNORMAL LOW (ref 150.0–400.0)
RBC: 4.16 Mil/uL — ABNORMAL LOW (ref 4.22–5.81)
RDW: 14 % (ref 11.5–14.6)
WBC: 9 10*3/uL (ref 4.5–10.5)

## 2013-06-10 LAB — BASIC METABOLIC PANEL
BUN: 26 mg/dL — ABNORMAL HIGH (ref 6–23)
CO2: 26 mEq/L (ref 19–32)
Calcium: 8.9 mg/dL (ref 8.4–10.5)
Chloride: 107 mEq/L (ref 96–112)
Creatinine, Ser: 1.8 mg/dL — ABNORMAL HIGH (ref 0.4–1.5)
GFR: 47.7 mL/min — ABNORMAL LOW (ref >60.00)
Glucose, Bld: 74 mg/dL (ref 70–99)
Potassium: 3.9 mEq/L (ref 3.5–5.1)
Sodium: 137 mEq/L (ref 135–145)

## 2013-06-10 NOTE — Progress Notes (Signed)
Matthew Bean Date of Birth: 03/05/1933 Medical Record #[id number]  History of Present Illness: Mr. Cerullo is seen back today for a follow up/work in visit. Seen for Dr. Clifton James. Has CAD, ischemic CM, HLD, DM, HTN, and CKD. Has had remote stenting of the LAD and PTCA of the LCX. He had remote PTCA of the RCA which was quite difficult due to heavy calcification in 2008 - formerly followed by Dr. Juanda Chance.    Last seen here 9 months ago - seemed to be doing ok. Did get his echo updated - now with moderate AS, EF had dropped to 20 to 25% - for repeat study this December. Not on ACE/ARB due to his CKD.   Comes back today. Here with his wife. Here because of a [facility] housecall visit - noted to have a "new murmur". He tells me that he feeling the same. He has some fatigue - but nothing worse. Has stable DOE that is not progressive. Not very active. Admits that he has issues with his memory. Wife says he is confused at times. Has been started on Namenda. Wife is not sure he is taking his medicines - he has been resistant to family helping oversee. No chest pain. No syncope. No scales in the home at the present time but the [facility] nurse can provide.   Current Outpatient Prescriptions  Medication Sig Dispense Refill  . amLODipine (NORVASC) 10 MG tablet TAKE ONE TABLET BY MOUTH EVERY DAY  30 tablet  7  . aspirin 81 MG tablet Take 81 mg by mouth daily.      . furosemide (LASIX) 20 MG tablet Take 1 tablet (20 mg total) by mouth daily as needed.  30 tablet  3  . glucose blood (ONE TOUCH TEST STRIPS) test strip 1 each by Other route as needed. Use as instructed      . glyBURIDE (DIABETA) 5 MG tablet TAKE ONE TABLET BY MOUTH TWICE DAILY WITH MEALS  180 tablet  0  . LANTUS SOLOSTAR 100 UNIT/ML injection USE AS DIRECTED  3 mL  2  . memantine (NAMENDA) 5 MG tablet TAKE ONE TABLET BY MOUTH TWICE DAILY  60 tablet  5  . metoprolol (LOPRESSOR) 50 MG tablet Take 1 tablet (50 mg total) by mouth 2 (two) times daily.  60 tablet  3    . nitroGLYCERIN (NITROSTAT) 0.4 MG SL tablet Place 0.4 mg under the tongue every 5 (five) minutes as needed. For chest pain      . RELION MINI PEN NEEDLES 31G X 6 MM MISC USE EVERY DAY  100 each  0   No current facility-administered medications for this visit.    No Known Allergies  Past Medical History  Diagnosis Date  . Hyperlipidemia   . Diabetes mellitus   . CHF (congestive heart failure)   . Hypertension   . Myocardial infarction   . Nephropathy   . Renal insufficiency   . CAD (coronary artery disease)   . Pulmonary nodule   . Rheumatoid arthritis(714.0)   . Hx of adenomatous colonic polyps     Past Surgical History  Procedure Laterality Date  . Angioplasty  1997, 2008  . Coronary stent placement  2002    coronary  . Hernia repair      Inguinal and ventral  . Colonoscopy w/ polypectomy  02/20/2011    8 polyps removed, largest 1 cm, mix of adenomas, lymphoid aggregates and hyperplastic    History  Smoking status  . Former  Smoker  . Quit date: 02/06/1989  Smokeless tobacco  . Former Neurosurgeon  . Quit date: 02/06/2006    History  Alcohol Use No    Family History  Problem Relation Age of Onset  . Stroke Mother   . Stroke Father     Review of Systems: The review of systems is per the HPI.  All other systems were reviewed and are negative.  Physical Exam: BP 150/70  Pulse 56  Ht 6\' 1"  (1.854 m)  Wt 220 lb 6.4 oz (99.973 kg)  BMI 29.08 kg/m2 Patient is very pleasant and in no acute distress. Skin is warm and dry. Color is looks a little pale to me. His weight is down 8 pounds over the past 9 months.  HEENT is unremarkable. Normocephalic/atraumatic. PERRL. Sclera are nonicteric. Neck is supple. No masses. No JVD. Lungs are clear. Cardiac exam shows a regular rate and rhythm. Harsh putflow murmur of AS noted. Abdomen is soft. Extremities are without edema today. Gait and ROM are intact. He is using a cane today. No gross neurologic deficits noted.  LABORATORY  DATA: Pending  Lab Results  Component Value Date   WBC 7.5 04/23/2013   HGB 13.6 04/23/2013   HCT 41.6 04/23/2013   PLT 139.0* 04/23/2013   GLUCOSE 76 04/23/2013   CHOL 101 04/23/2013   TRIG 122.0 04/23/2013   HDL 32.60* 04/23/2013   LDLDIRECT 81.2 05/03/2010   LDLCALC 44 04/23/2013   ALT 10 04/23/2013   AST 15 04/23/2013   NA 140 04/23/2013   K 3.6 04/23/2013   CL 103 04/23/2013   CREATININE 2.0* 04/23/2013   BUN 34* 04/23/2013   CO2 29 04/23/2013   TSH 2.07 01/01/2013   PSA 2.07 05/03/2010   HGBA1C 6.0 04/23/2013   MICROALBUR 7.1* 04/23/2013   Echo Study Conclusions from December 2013  - Left ventricle: The cavity size was mildly dilated. Wall thickness was normal. Systolic function was severely reduced. The estimated ejection fraction was in the range of 25% to 30%. Diffuse hypokinesis. There is akinesis of the mid-distalanteroseptal and apical myocardium. Doppler parameters are consistent with abnormal left ventricular relaxation (grade 1 diastolic dysfunction). Doppler parameters are consistent with high ventricular filling pressure. - Aortic valve: Valve mobility was restricted. There was moderate stenosis. Moderate regurgitation. Mean gradient: 25mm Hg (S). Peak gradient: 40mm Hg (S). - Mitral valve: Calcified annulus. Mildly thickened leaflets . - Left atrium: The atrium was mildly dilated.  Echo shows that his LV is a little weaker than it was in 2008 (LVEF now 30% and had been called 40%). His aortic valve has gotten more calcified and his aortic stenosis has gone from mild to moderate and leakiness of aortic valve is a little worse. Can we let him know? Repeat echo one year. NO changes in medicines. cdm   Assessment / Plan:  1. Ischemic CM - seems to me that his symptoms are not progressive - wife agrees to this as well. May need to consider Bidil in the future.   2. AS - no chest pain, no syncope and has stable DOE - for repeat study in December with follow up with Dr.  Clifton James.   3. Dementia - probably his most limiting factor - have tried to encourage him to let family oversee his medicines - no one including myself is convinced that he is taking as prescribed.   4. CKD - rechecking labs today.   Patient is agreeable to this plan and will call if any  problems develop in the interim.   Rosalio Macadamia, RN, ANP-C [facility] [address] [place], [place]  [address]

## 2013-06-10 NOTE — Patient Instructions (Addendum)
Stay on current medicines for now  We will check labs today  Ask the [facility] nurse to get you some scales  Weigh every day - if your weight goes up 2 to 3 pounds overnight - that would be the day to take the Lasix  Let your family oversee your medicine so we can make sure you are taking correctly  See Dr. Clifton James back in December as planned.  Call the [facility] office at [phone] if you have any questions, problems or concerns.

## 2013-06-12 ENCOUNTER — Telehealth: Payer: Self-pay | Admitting: Cardiovascular Disease

## 2013-06-12 NOTE — Telephone Encounter (Signed)
Spoke with pt's wife who reports [employer] needs order for pt to receive scales (recommended at recent visit with Norma Fredrickson, NP).  Number to call is [phone]. I spoke with nurse at this number and was transferred to another number-[phone] and was told to contact pt's nurse--Deliliah Earl Lites at extension [phone]. I left message for Deliliah regarding request for scales. Left office number for call back.

## 2013-06-12 NOTE — Telephone Encounter (Signed)
New problem    [employer] is asking for an order for patient to get the weight scales back.

## 2013-06-23 NOTE — Telephone Encounter (Signed)
Left message for Matthew Bean to call back
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
## 2013-07-07 ENCOUNTER — Other Ambulatory Visit: Payer: Self-pay | Admitting: Cardiovascular Disease

## 2013-07-07 ENCOUNTER — Other Ambulatory Visit: Payer: Self-pay

## 2013-08-08 ENCOUNTER — Emergency Department (HOSPITAL_COMMUNITY): Payer: Medicare Other

## 2013-08-08 ENCOUNTER — Inpatient Hospital Stay (HOSPITAL_COMMUNITY)
Admission: EM | Admit: 2013-08-08 | Discharge: 2013-08-13 | DRG: 286 | Disposition: A | Discharge status: Home Health | Payer: Medicare Other | Attending: Internal Medicine | Admitting: Internal Medicine

## 2013-08-08 ENCOUNTER — Encounter (HOSPITAL_COMMUNITY): Payer: Self-pay | Admitting: Emergency Medicine

## 2013-08-08 DIAGNOSIS — R55 Syncope and collapse: Secondary | ICD-10-CM | POA: Diagnosis present

## 2013-08-08 DIAGNOSIS — N183 Chronic kidney disease, stage 3 unspecified: Secondary | ICD-10-CM | POA: Diagnosis present

## 2013-08-08 DIAGNOSIS — R579 Shock, unspecified: Secondary | ICD-10-CM | POA: Diagnosis present

## 2013-08-08 DIAGNOSIS — Z86010 Personal history of colonic polyps: Secondary | ICD-10-CM

## 2013-08-08 DIAGNOSIS — I639 Cerebral infarction, unspecified: Secondary | ICD-10-CM | POA: Diagnosis present

## 2013-08-08 DIAGNOSIS — E785 Hyperlipidemia, unspecified: Secondary | ICD-10-CM | POA: Diagnosis present

## 2013-08-08 DIAGNOSIS — I2589 Other forms of chronic ischemic heart disease: Secondary | ICD-10-CM | POA: Diagnosis present

## 2013-08-08 DIAGNOSIS — Z87891 Personal history of nicotine dependence: Secondary | ICD-10-CM

## 2013-08-08 DIAGNOSIS — I6340 Cerebral infarction due to embolism of unspecified cerebral artery: Secondary | ICD-10-CM | POA: Diagnosis present

## 2013-08-08 DIAGNOSIS — Z8673 Personal history of transient ischemic attack (TIA), and cerebral infarction without residual deficits: Secondary | ICD-10-CM

## 2013-08-08 DIAGNOSIS — R4182 Altered mental status, unspecified: Secondary | ICD-10-CM

## 2013-08-08 DIAGNOSIS — I129 Hypertensive chronic kidney disease with stage 1 through stage 4 chronic kidney disease, or unspecified chronic kidney disease: Secondary | ICD-10-CM | POA: Diagnosis present

## 2013-08-08 DIAGNOSIS — M069 Rheumatoid arthritis, unspecified: Secondary | ICD-10-CM | POA: Diagnosis present

## 2013-08-08 DIAGNOSIS — Z66 Do not resuscitate: Secondary | ICD-10-CM | POA: Diagnosis present

## 2013-08-08 DIAGNOSIS — Z79899 Other long term (current) drug therapy: Secondary | ICD-10-CM

## 2013-08-08 DIAGNOSIS — E1165 Type 2 diabetes mellitus with hyperglycemia: Secondary | ICD-10-CM

## 2013-08-08 DIAGNOSIS — I10 Essential (primary) hypertension: Secondary | ICD-10-CM | POA: Diagnosis present

## 2013-08-08 DIAGNOSIS — F0390 Unspecified dementia without behavioral disturbance: Secondary | ICD-10-CM | POA: Diagnosis present

## 2013-08-08 DIAGNOSIS — I472 Ventricular tachycardia, unspecified: Principal | ICD-10-CM | POA: Diagnosis present

## 2013-08-08 DIAGNOSIS — I5040 Unspecified combined systolic (congestive) and diastolic (congestive) heart failure: Secondary | ICD-10-CM | POA: Diagnosis present

## 2013-08-08 DIAGNOSIS — I4729 Other ventricular tachycardia: Principal | ICD-10-CM | POA: Diagnosis present

## 2013-08-08 DIAGNOSIS — N179 Acute kidney failure, unspecified: Secondary | ICD-10-CM | POA: Diagnosis present

## 2013-08-08 DIAGNOSIS — J9600 Acute respiratory failure, unspecified whether with hypoxia or hypercapnia: Secondary | ICD-10-CM | POA: Diagnosis present

## 2013-08-08 DIAGNOSIS — J189 Pneumonia, unspecified organism: Secondary | ICD-10-CM

## 2013-08-08 DIAGNOSIS — I359 Nonrheumatic aortic valve disorder, unspecified: Secondary | ICD-10-CM | POA: Diagnosis present

## 2013-08-08 DIAGNOSIS — I2510 Atherosclerotic heart disease of native coronary artery without angina pectoris: Secondary | ICD-10-CM | POA: Diagnosis present

## 2013-08-08 DIAGNOSIS — N259 Disorder resulting from impaired renal tubular function, unspecified: Secondary | ICD-10-CM

## 2013-08-08 DIAGNOSIS — Z794 Long term (current) use of insulin: Secondary | ICD-10-CM

## 2013-08-08 DIAGNOSIS — J9601 Acute respiratory failure with hypoxia: Secondary | ICD-10-CM | POA: Diagnosis present

## 2013-08-08 DIAGNOSIS — M109 Gout, unspecified: Secondary | ICD-10-CM

## 2013-08-08 DIAGNOSIS — I509 Heart failure, unspecified: Secondary | ICD-10-CM | POA: Diagnosis present

## 2013-08-08 DIAGNOSIS — E872 Acidosis, unspecified: Secondary | ICD-10-CM | POA: Diagnosis present

## 2013-08-08 DIAGNOSIS — Z7982 Long term (current) use of aspirin: Secondary | ICD-10-CM

## 2013-08-08 DIAGNOSIS — N059 Unspecified nephritic syndrome with unspecified morphologic changes: Secondary | ICD-10-CM | POA: Diagnosis present

## 2013-08-08 DIAGNOSIS — Z9861 Coronary angioplasty status: Secondary | ICD-10-CM

## 2013-08-08 DIAGNOSIS — IMO0002 Reserved for concepts with insufficient information to code with codable children: Secondary | ICD-10-CM | POA: Diagnosis present

## 2013-08-08 DIAGNOSIS — I4891 Unspecified atrial fibrillation: Secondary | ICD-10-CM | POA: Diagnosis present

## 2013-08-08 DIAGNOSIS — E876 Hypokalemia: Secondary | ICD-10-CM | POA: Diagnosis not present

## 2013-08-08 DIAGNOSIS — I252 Old myocardial infarction: Secondary | ICD-10-CM

## 2013-08-08 DIAGNOSIS — R531 Weakness: Secondary | ICD-10-CM

## 2013-08-08 DIAGNOSIS — Z23 Encounter for immunization: Secondary | ICD-10-CM

## 2013-08-08 LAB — POCT I-STAT, CHEM 8
BUN: 32 mg/dL — ABNORMAL HIGH (ref 6–23)
Calcium, Ion: 1.1 mmol/L — ABNORMAL LOW (ref 1.13–1.30)
Chloride: 106 mEq/L (ref 96–112)
Glucose, Bld: 276 mg/dL — ABNORMAL HIGH (ref 70–99)
HCT: 44 % (ref 39.0–52.0)
Hemoglobin: 15 g/dL (ref 13.0–17.0)
TCO2: 17 mmol/L (ref 0–100)

## 2013-08-08 LAB — POCT I-STAT 3, ART BLOOD GAS (G3+)
Acid-base deficit: 13 mmol/L — ABNORMAL HIGH (ref 0.0–2.0)
Acid-base deficit: 5 mmol/L — ABNORMAL HIGH (ref 0.0–2.0)
O2 Saturation: 96 %
O2 Saturation: 92 %
Patient temperature: 98.6
Patient temperature: 98.6
pO2, Arterial: 95 mmHg (ref 80.0–100.0)
pO2, Arterial: 66 mmHg — ABNORMAL LOW (ref 80.0–100.0)

## 2013-08-08 LAB — COMPREHENSIVE METABOLIC PANEL
ALT: 59 U/L — ABNORMAL HIGH (ref 0–53)
BUN: 31 mg/dL — ABNORMAL HIGH (ref 6–23)
CO2: 15 mEq/L — ABNORMAL LOW (ref 19–32)
Calcium: 8.1 mg/dL — ABNORMAL LOW (ref 8.4–10.5)
GFR calc Af Amer: 33 mL/min — ABNORMAL LOW (ref >90)
GFR calc non Af Amer: 29 mL/min — ABNORMAL LOW (ref >90)
Glucose, Bld: 327 mg/dL — ABNORMAL HIGH (ref 70–99)
Sodium: 138 mEq/L (ref 135–145)
Total Bilirubin: 0.6 mg/dL (ref 0.3–1.2)
Total Protein: 6.6 g/dL (ref 6.0–8.3)

## 2013-08-08 LAB — CG4 I-STAT (LACTIC ACID): Lactic Acid, Venous: 9.78 mmol/L — ABNORMAL HIGH (ref 0.5–2.2)

## 2013-08-08 LAB — CBC WITH DIFFERENTIAL/PLATELET
Basophils Absolute: 0.1 10*3/uL (ref 0.0–0.1)
Eosinophils Absolute: 0.4 10*3/uL (ref 0.0–0.7)
HCT: 41.6 % (ref 39.0–52.0)
Lymphocytes Relative: 32 % (ref 12–46)
Lymphs Abs: 2.7 10*3/uL (ref 0.7–4.0)
MCH: 31.1 pg (ref 26.0–34.0)
MCHC: 31.7 g/dL (ref 30.0–36.0)
MCV: 97.9 fL (ref 78.0–100.0)
Monocytes Relative: 7 % (ref 3–12)
Neutro Abs: 4.5 10*3/uL (ref 1.7–7.7)
RBC: 4.25 MIL/uL (ref 4.22–5.81)
RDW: 15.3 % (ref 11.5–15.5)

## 2013-08-08 MED ORDER — SODIUM CHLORIDE 0.9 % IV BOLUS (SEPSIS): 1000.0000 mL | Freq: Once | INTRAVENOUS | Status: DC

## 2013-08-08 MED ORDER — SODIUM CHLORIDE 0.9 % IV SOLN
Freq: Once | INTRAVENOUS | Status: AC
  Administered 2013-08-08: 10 mL/h via INTRAVENOUS

## 2013-08-08 NOTE — ED Notes (Signed)
CT finished

## 2013-08-08 NOTE — ED Notes (Addendum)
Tachypniec RR 38, SPO2 100% NRB, afebrile, RT callled for ABG, xray at BS, NSR on monitor 67, radial pulses moderate equal.

## 2013-08-08 NOTE — ED Notes (Signed)
Alert, NAD, calm, family x2 at [facility], tolerating off Bipap, tolerating [place].

## 2013-08-08 NOTE — ED Notes (Addendum)
Back from CT, no changes, tolerated well, family x2 at [facility], pt alert, NAD, calm, interactive, tolerating Bipap, "feel better, ready to go home now", denies pain. VSS. IVF w.o. to gravity 2nd half of EMS liter.

## 2013-08-08 NOTE — ED Notes (Signed)
Blood drawn, xray called for.

## 2013-08-08 NOTE — ED Notes (Signed)
abg obtained R radial.

## 2013-08-08 NOTE — ED Notes (Signed)
To CT on Bipap, pt and family updated, no changes, VSS, RT present, scanning now.

## 2013-08-08 NOTE — ED Notes (Signed)
RT with Bipap at [facility]

## 2013-08-08 NOTE — ED Notes (Signed)
Here by EMS from home, family present at home, found unresponsive on commode, denies pain,initially then admits to CP on arrival in ED, Drs. Romeo Apple and [place] present on arrival, new onset afib, not on blood thinners, c/o general weakness & sob, Arrives skin cool and moist, hypotensive at home, 74/38, LSN 1730, cbg high 288, LS CTA tachypneic, arousable interactive, following commands, answering questions.

## 2013-08-08 NOTE — ED Notes (Addendum)
i-stat CG4+( 9.78 mmol/L) result given to Dr. Romeo Apple

## 2013-08-08 NOTE — ED Provider Notes (Signed)
CSN: [id number]     Arrival date & time 08/08/13  2054 History   First MD Initiated Contact with Patient 08/08/13 2109     Chief Complaint  Patient presents with  . Altered Mental Status  . Hypotension  . Atrial Fibrillation   (Consider location/radiation/quality/duration/timing/severity/associated sxs/prior Treatment) The history is provided by the EMS personnel and the spouse. The history is limited by the condition of the patient. No language interpreter was used.   This is a 79 year old American male with past medical history of CVA and MI who comes emergency department today with altered mental status. His wife called at 5:30 in the evening at that time he was normal. When she returned to the house around 8:00 she found him on the toilet unresponsive. She contacted EMS. When EMS arrived at home patient's blood pressure was low. They laid him down and his mentation and blood pressure improved. In route he was still confused so they were unable to obtain further history.  Past Medical History  Diagnosis Date  . Hyperlipidemia   . Diabetes mellitus   . CHF (congestive heart failure)   . Hypertension   . Myocardial infarction   . Nephropathy   . Renal insufficiency   . CAD (coronary artery disease)   . Pulmonary nodule   . Rheumatoid arthritis(714.0)   . Hx of adenomatous colonic polyps    Past Surgical History  Procedure Laterality Date  . Angioplasty  1997, 2008  . Coronary stent placement  2002    coronary  . Hernia repair      Inguinal and ventral  . Colonoscopy w/ polypectomy  02/20/2011    8 polyps removed, largest 1 cm, mix of adenomas, lymphoid aggregates and hyperplastic   Family History  Problem Relation Age of Onset  . Stroke Mother   . Stroke Father    History  Substance Use Topics  . Smoking status: Former Smoker    Quit date: 02/06/1989  . Smokeless tobacco: Former Neurosurgeon    Quit date: 02/06/2006  . Alcohol Use: No    Review of Systems  Unable to  perform ROS: Mental status change    Allergies  Review of patient's allergies indicates no known allergies.  Home Medications   Current Outpatient Rx  Name  Route  Sig  Dispense  Refill  . amLODipine (NORVASC) 10 MG tablet   Oral   Take 10 mg by mouth daily.         Marland Kitchen aspirin EC 81 MG tablet   Oral   Take 81 mg by mouth daily.         . furosemide (LASIX) 20 MG tablet   Oral   Take 20 mg by mouth daily as needed for edema.         Marland Kitchen glyBURIDE (DIABETA) 5 MG tablet   Oral   Take 5 mg by mouth 2 (two) times daily with a meal.         . hydrochlorothiazide (HYDRODIURIL) 25 MG tablet   Oral   Take 25 mg by mouth daily.         . Insulin Glargine (LANTUS SOLOSTAR) 100 UNIT/ML SOPN   Subcutaneous   Inject 12 Units into the skin at bedtime as needed (for high cbg).         . memantine (NAMENDA) 5 MG tablet   Oral   Take 5 mg by mouth 2 (two) times daily.         . metoprolol (LOPRESSOR)  50 MG tablet   Oral   Take 1 tablet (50 mg total) by mouth 2 (two) times daily.   60 tablet   3   . nitroGLYCERIN (NITROSTAT) 0.4 MG SL tablet   Sublingual   Place 0.4 mg under the tongue every 5 (five) minutes as needed. For chest pain         . glucose blood (ONE TOUCH TEST STRIPS) test strip   Other   1 each by Other route as needed. Use as instructed         . methotrexate (RHEUMATREX) 2.5 MG tablet   Oral   Take 12.5 mg by mouth once a week.         Marland Kitchen RELION MINI PEN NEEDLES 31G X 6 MM MISC      USE EVERY DAY   100 each   0    BP 111/75  Pulse 63  Resp 29  SpO2 96% Physical Exam  Nursing note and vitals reviewed. Constitutional: He is oriented to person, place, and time. He appears well-developed and well-nourished. No distress.  HENT:  Head: Normocephalic and atraumatic.  Eyes: Pupils are equal, round, and reactive to light.  Neck: Normal range of motion.  Cardiovascular: Normal rate, regular rhythm and normal heart sounds.    Pulmonary/Chest: Effort normal and breath sounds normal. No respiratory distress. He has no decreased breath sounds. He has no wheezes. He has no rhonchi. He has no rales.  Abdominal: Soft. He exhibits no distension. There is no tenderness. There is no rebound and no guarding.  Genitourinary: Guaiac negative stool.  Musculoskeletal: He exhibits no edema and no tenderness.  Neurological: He is alert and oriented to person, place, and time. No sensory deficit. He exhibits normal muscle tone.  Skin: Skin is warm and dry. He is not diaphoretic.    ED Course  Procedures (including critical care time) Labs Review Labs Reviewed  CBC WITH DIFFERENTIAL - Abnormal; Notable for the following:    Platelets 96 (*)    All other components within normal limits  PRO B NATRIURETIC PEPTIDE - Abnormal; Notable for the following:    Pro B Natriuretic peptide (BNP) 2702.0 (*)    All other components within normal limits  GLUCOSE, CAPILLARY - Abnormal; Notable for the following:    Glucose-Capillary 219 (*)    All other components within normal limits  COMPREHENSIVE METABOLIC PANEL - Abnormal; Notable for the following:    CO2 15 (*)    Glucose, Bld 327 (*)    BUN 31 (*)    Creatinine, Ser 2.09 (*)    Calcium 8.1 (*)    Albumin 2.8 (*)    AST 93 (*)    ALT 59 (*)    GFR calc non Af Amer 29 (*)    GFR calc Af Amer 33 (*)    All other components within normal limits  POCT I-STAT, CHEM 8 - Abnormal; Notable for the following:    BUN 32 (*)    Creatinine, Ser 2.20 (*)    Glucose, Bld 276 (*)    Calcium, Ion 1.10 (*)    All other components within normal limits  CG4 I-STAT (LACTIC ACID) - Abnormal; Notable for the following:    Lactic Acid, Venous 9.78 (*)    All other components within normal limits  POCT I-STAT 3, BLOOD GAS (G3+) - Abnormal; Notable for the following:    pH, Arterial 7.206 (*)    pCO2 arterial 33.8 (*)  Bicarbonate 13.4 (*)    Acid-base deficit 13.0 (*)    All other  components within normal limits  POCT I-STAT 3, BLOOD GAS (G3+) - Abnormal; Notable for the following:    pO2, Arterial 66.0 (*)    Acid-base deficit 5.0 (*)    All other components within normal limits  CG4 I-STAT (LACTIC ACID) - Abnormal; Notable for the following:    Lactic Acid, Venous 4.27 (*)    All other components within normal limits  CULTURE, BLOOD (ROUTINE X 2)  CULTURE, BLOOD (ROUTINE X 2)  URINE CULTURE  MRSA PCR SCREENING  URINALYSIS W MICROSCOPIC + REFLEX CULTURE  BLOOD GAS, ARTERIAL  BLOOD GAS, ARTERIAL  URINALYSIS, ROUTINE W REFLEX MICROSCOPIC  POCT I-STAT TROPONIN I   Imaging Review Ct Head Wo Contrast  08/08/2013   CLINICAL DATA:  Unresponsive  EXAM: CT HEAD WITHOUT CONTRAST  TECHNIQUE: Contiguous axial images were obtained from the base of the skull through the vertex without intravenous contrast.  COMPARISON:  Prior CT from 06/02/2012  FINDINGS: Extensive age-related atrophy and chronic microvascular ischemic disease there is again seen, similar as compared to the prior examination. Ventricular size is unchanged. Prominent bilateral basal ganglia calcifications are noted. No acute intracranial hemorrhage or large vessel territory infarct. No extra-axial fluid collection. Remote right cerebellar infarct and right parietal infarcts again noted. No mass lesion or midline shift. Calvarium is intact. Orbital soft tissues are within normal limits. The paranasal sinuses and mastoid air cells are clear.  IMPRESSION: 1. No acute intracranial process. 2. Stable appearance of extensive global atrophy and chronic microvascular ischemic disease. 3. Remote right cerebellar and right frontoparietal infarcts.   Electronically Signed   By: Rise Mu M.D.   On: 08/08/2013 22:34   Dg Chest Portable 1 View  08/08/2013   CLINICAL DATA:  Altered mental status. Hypertension. Atrial fibrillation  EXAM: PORTABLE CHEST - 1 VIEW  COMPARISON:  06/02/2012  FINDINGS: Chronic cardiomegaly,  stable from prior. Mediastinal contours distorted by rightward rotation. Chronic interstitial coarsening at the bases. No acute infiltrate or edema. No effusion or pneumothorax. Linear high attenuation overlapping the lower right neck is likely either the thyroid cartilage or external to the patient.No history of neck pain.  IMPRESSION: Negative for edema or consolidation.   Electronically Signed   By: Tiburcio Pea M.D.   On: 08/08/2013 21:26    EKG Interpretation   None       MDM  Patient is a 79 year old African American male past medical history CVA and MI who comes emergency department today with altered mental status. Physical exam as above. Upon initial evaluation patient was lethargic but oriented. He was able to answer simple questions however is not able to provide a history. He was initially tachypneic.  Upon initial evaluation he was hypertensive, tachycardic, or febrile. As a result he did not meet sepsis criteria and a code sepsis was not called.  With tachypnea and history of congestive heart failure patient was used on BiPAP. Initial workup included an i-STAT lactic acid, ABG, chem 8, troponin, CMP, BMP, glucose, CBC, CT head, EKG, and a chest x-ray.  Chest x-ray was unchanged from previous x-rays. CT of the head that showed no acute process. CBC was unremarkable. Glucose was 219. BMP was elevated 2700. CMP creatinine of  2.09 close to the patient's baseline an AST of 93 and an ALT of 59 otherwise unremarkable. I-STAT troponin was negative. I-STAT lactic acid was 9.7. I-STAT blood gas demonstrate a metabolic  acidosis. Initial differential diagnosis included aortic dissection, MI, PE, pneumonia, UTI, seizure. Shortly after admission to the emergency department Mr. Cromie began to improve. Respiratory rate decreased to a normal respiratory rate. His mental status improved without intervention. Result of a sudden improvement doubt an aortic dissection or PE. With negative troponin and an  unchanged EKG doubt an MI. With no consolidations on chest x-ray doubt a pneumonia. A urine, blood cultures, urine cultures are pending however with no dysuria, frequency, or urgency and improvement with very little intervention doubt urinary tract infection. Patient was removed from BiPAP and still had an oxygen requirement of 5 L of oxygen which is not normal for him. It is provided with a liter of normal saline. A repeat ABG an i-STAT lactic acid were obtained. ABG demonstrated resolution of the metabolic acidosis. I-STAT lactic acid was 4.2. Altered mental status without an obvious cause patient was felt to require admission to the hospitalist service. With improvement in status is not felt to require admission to critical care. Patient was admitted to the hospitalist service in stable condition. Labs and imaging reviewed by myself and considered and medical decision-making. Imaging was interpreted by radiology. Care was discussed with my attending Dr. Romeo Apple.    1. Altered mental status   2. Lactic acidosis   3. Metabolic acidosis       Bethann Berkshire, MD 08/09/13 0020

## 2013-08-09 ENCOUNTER — Inpatient Hospital Stay (HOSPITAL_COMMUNITY): Payer: Medicare Other

## 2013-08-09 ENCOUNTER — Encounter (HOSPITAL_COMMUNITY): Payer: Self-pay | Admitting: *Deleted

## 2013-08-09 DIAGNOSIS — I472 Ventricular tachycardia: Secondary | ICD-10-CM | POA: Diagnosis present

## 2013-08-09 DIAGNOSIS — E1165 Type 2 diabetes mellitus with hyperglycemia: Secondary | ICD-10-CM

## 2013-08-09 DIAGNOSIS — J9600 Acute respiratory failure, unspecified whether with hypoxia or hypercapnia: Secondary | ICD-10-CM

## 2013-08-09 DIAGNOSIS — R55 Syncope and collapse: Secondary | ICD-10-CM | POA: Diagnosis present

## 2013-08-09 DIAGNOSIS — J9601 Acute respiratory failure with hypoxia: Secondary | ICD-10-CM | POA: Diagnosis present

## 2013-08-09 DIAGNOSIS — N189 Chronic kidney disease, unspecified: Secondary | ICD-10-CM

## 2013-08-09 DIAGNOSIS — I6350 Cerebral infarction due to unspecified occlusion or stenosis of unspecified cerebral artery: Secondary | ICD-10-CM

## 2013-08-09 DIAGNOSIS — R4182 Altered mental status, unspecified: Secondary | ICD-10-CM

## 2013-08-09 DIAGNOSIS — I639 Cerebral infarction, unspecified: Secondary | ICD-10-CM | POA: Diagnosis present

## 2013-08-09 DIAGNOSIS — N179 Acute kidney failure, unspecified: Secondary | ICD-10-CM | POA: Diagnosis present

## 2013-08-09 DIAGNOSIS — E872 Acidosis: Secondary | ICD-10-CM | POA: Diagnosis present

## 2013-08-09 DIAGNOSIS — I10 Essential (primary) hypertension: Secondary | ICD-10-CM

## 2013-08-09 DIAGNOSIS — I2510 Atherosclerotic heart disease of native coronary artery without angina pectoris: Secondary | ICD-10-CM

## 2013-08-09 LAB — URINALYSIS, ROUTINE W REFLEX MICROSCOPIC
Bilirubin Urine: NEGATIVE
Glucose, UA: >1000 mg/dL — AB
Ketones, ur: NEGATIVE mg/dL
Protein, ur: 100 mg/dL — AB
pH: 5 (ref 5.0–8.0)

## 2013-08-09 LAB — COMPREHENSIVE METABOLIC PANEL
AST: 153 U/L — ABNORMAL HIGH (ref 0–37)
CO2: 21 mEq/L (ref 19–32)
Calcium: 8 mg/dL — ABNORMAL LOW (ref 8.4–10.5)
Creatinine, Ser: 1.99 mg/dL — ABNORMAL HIGH (ref 0.50–1.35)
GFR calc non Af Amer: 30 mL/min — ABNORMAL LOW (ref >90)
Potassium: 4 mEq/L (ref 3.5–5.1)
Sodium: 137 mEq/L (ref 135–145)

## 2013-08-09 LAB — RAPID URINE DRUG SCREEN, HOSP PERFORMED
Amphetamines: NOT DETECTED
Barbiturates: NOT DETECTED
Benzodiazepines: NOT DETECTED
Tetrahydrocannabinol: NOT DETECTED

## 2013-08-09 LAB — CBC
Hemoglobin: 12.3 g/dL — ABNORMAL LOW (ref 13.0–17.0)
MCH: 30.4 pg (ref 26.0–34.0)
RBC: 4.05 MIL/uL — ABNORMAL LOW (ref 4.22–5.81)
RDW: 15.2 % (ref 11.5–15.5)

## 2013-08-09 LAB — URINE MICROSCOPIC-ADD ON

## 2013-08-09 LAB — LIPID PANEL
Cholesterol: 169 mg/dL (ref 0–200)
HDL: 37 mg/dL — ABNORMAL LOW (ref >39)
Total CHOL/HDL Ratio: 4.6 RATIO
Triglycerides: 130 mg/dL (ref <150)

## 2013-08-09 LAB — HEMOGLOBIN A1C
Hgb A1c MFr Bld: 6.8 % — ABNORMAL HIGH (ref <5.7)
Mean Plasma Glucose: 148 mg/dL — ABNORMAL HIGH (ref <117)

## 2013-08-09 LAB — TROPONIN I
Troponin I: 2.32 ng/mL (ref <0.30)
Troponin I: 2.66 ng/mL (ref <0.30)
Troponin I: 2.35 ng/mL (ref <0.30)

## 2013-08-09 LAB — GLUCOSE, CAPILLARY
Glucose-Capillary: 90 mg/dL (ref 70–99)
Glucose-Capillary: 137 mg/dL — ABNORMAL HIGH (ref 70–99)
Glucose-Capillary: 203 mg/dL — ABNORMAL HIGH (ref 70–99)

## 2013-08-09 LAB — LACTIC ACID, PLASMA: Lactic Acid, Venous: 2.1 mmol/L (ref 0.5–2.2)

## 2013-08-09 LAB — PROTIME-INR
INR: 1.22 (ref 0.00–1.49)
Prothrombin Time: 15.1 seconds (ref 11.6–15.2)

## 2013-08-09 LAB — PRO B NATRIURETIC PEPTIDE: Pro B Natriuretic peptide (BNP): 3120 pg/mL — ABNORMAL HIGH (ref 0–450)

## 2013-08-09 LAB — PHOSPHORUS: Phosphorus: 4.4 mg/dL (ref 2.3–4.6)

## 2013-08-09 MED ORDER — ONDANSETRON HCL 4 MG PO TABS: 4.0000 mg | ORAL_TABLET | Freq: Four times a day (QID) | ORAL | Status: DC | PRN

## 2013-08-09 MED ORDER — ONDANSETRON HCL 4 MG/2ML IJ SOLN: 4.0000 mg | Freq: Four times a day (QID) | INTRAMUSCULAR | Status: DC | PRN

## 2013-08-09 MED ORDER — INSULIN ASPART 100 UNIT/ML ~~LOC~~ SOLN
0.0000 [IU] | Freq: Three times a day (TID) | SUBCUTANEOUS | Status: DC
  Administered 2013-08-09 – 2013-08-10 (×2): 1 [IU] via SUBCUTANEOUS

## 2013-08-09 MED ORDER — PNEUMOCOCCAL VAC POLYVALENT 25 MCG/0.5ML IJ INJ
0.5000 mL | INJECTION | INTRAMUSCULAR | Status: AC
  Administered 2013-08-10: 0.5 mL via INTRAMUSCULAR
  Filled 2013-08-09: qty 0.5

## 2013-08-09 MED ORDER — INSULIN GLARGINE 100 UNIT/ML ~~LOC~~ SOLN
12.0000 [IU] | Freq: Every day | SUBCUTANEOUS | Status: DC
  Administered 2013-08-09 – 2013-08-11 (×3): 12 [IU] via SUBCUTANEOUS
  Filled 2013-08-09 (×4): qty 0.12

## 2013-08-09 MED ORDER — HYDROCODONE-ACETAMINOPHEN 5-325 MG PO TABS
1.0000 | ORAL_TABLET | ORAL | Status: DC | PRN
Start: 2013-08-09 — End: 2013-08-13

## 2013-08-09 MED ORDER — HEPARIN (PORCINE) IN NACL 100-0.45 UNIT/ML-% IJ SOLN
1200.0000 [IU]/h | INTRAMUSCULAR | Status: DC
  Filled 2013-08-09: qty 250

## 2013-08-09 MED ORDER — ENOXAPARIN SODIUM 40 MG/0.4ML ~~LOC~~ SOLN
40.0000 mg | SUBCUTANEOUS | Status: DC
  Administered 2013-08-09: 40 mg via SUBCUTANEOUS
  Filled 2013-08-09 (×2): qty 0.4

## 2013-08-09 MED ORDER — CARVEDILOL 6.25 MG PO TABS
6.2500 mg | ORAL_TABLET | Freq: Two times a day (BID) | ORAL | Status: DC
  Administered 2013-08-09 – 2013-08-12 (×5): 6.25 mg via ORAL
  Filled 2013-08-09 (×8): qty 1

## 2013-08-09 MED ORDER — HEPARIN BOLUS VIA INFUSION
2000.0000 [IU] | Freq: Once | INTRAVENOUS | Status: DC
  Filled 2013-08-09: qty 2000

## 2013-08-09 MED ORDER — METOPROLOL TARTRATE 50 MG PO TABS
50.0000 mg | ORAL_TABLET | Freq: Two times a day (BID) | ORAL | Status: DC
  Administered 2013-08-09 (×2): 50 mg via ORAL
  Filled 2013-08-09 (×3): qty 1

## 2013-08-09 MED ORDER — POTASSIUM CHLORIDE ER 10 MEQ PO TBCR
20.0000 meq | EXTENDED_RELEASE_TABLET | Freq: Every day | ORAL | Status: DC
  Administered 2013-08-09 – 2013-08-13 (×4): 20 meq via ORAL
  Filled 2013-08-09 (×5): qty 2

## 2013-08-09 MED ORDER — FUROSEMIDE 40 MG PO TABS
40.0000 mg | ORAL_TABLET | Freq: Every day | ORAL | Status: DC
  Administered 2013-08-09 – 2013-08-10 (×2): 40 mg via ORAL
  Filled 2013-08-09 (×2): qty 1

## 2013-08-09 MED ORDER — ASPIRIN EC 81 MG PO TBEC
81.0000 mg | DELAYED_RELEASE_TABLET | Freq: Every day | ORAL | Status: DC
  Administered 2013-08-09 – 2013-08-10 (×2): 81 mg via ORAL
  Filled 2013-08-09 (×4): qty 1

## 2013-08-09 NOTE — Consult Note (Addendum)
Referring Physician: Dr. Criselda Peaches Primary Cardiologist: Clifton James Reason for Consultation: + troponin   HPI:  Matthew. Bean is a 79 yo AAM with history of CAD, ischemic cardiomyopathy (EF 25-30%), HLD, DM, HTN and CRI whom we are asked to see due to + troponin and altered mental status   He has been followed by Dr. Clifton James. He had remote stenting of the LAD and PTCA of the circumflex artery. In 2008 he had PTCA of the mid right coronary artery. It was a difficult procedure. The vessel was heavily calcified and a stent could not be delivered to the lesion. (Not able to find full cath report in Epic). Last echo 12/13 with EF 25-30% and moderate AS (mean 25)/moderate AI.   He presented to [facility] ED with sudden onset altered mental status. Says he was sitting on a stool at home and slumped over. Wife found him confused and unresponsive. He denies CP, palpitations or other warning signs.. In ED, pt found to be initially lethargic with lactic acid > 9, with pH 7.2 and pO2 60. Pt was given 500 cc NS and his mental status improved, lactic acid down to 2.1 and oxygen saturation 100% on 4 L [place]. CT head without acute process.   Now he is awake and alert. At baseline NYHA II-III dyspnea. No CP, orthopnea or PND. No previous presyncope/syncope.   Troponin 0.9-> 2.3->2.6 . BNP 1610 Renal function stable with Cr ~2.0. WBC 5.6 Head CT: no acute process. Old infarcts and volume loss.  Tel: SR with 14 beat run NSVT  ECG with SR and small inferior Qs. Inferolateral TWI. No change from ECG 06/04/12       Review of Systems:     Cardiac Review of Systems: {Y] = yes [ ]  = no  Chest Pain [    ]  Resting SOB [   ] Exertional SOB  [  ]  Orthopnea [  ]   Pedal Edema [   ]    Palpitations [  ] Syncope  Cove.Etienne  ]   Presyncope [   ]  General Review of Systems: [Y] = yes [  ]=no Constitional: recent weight change [  ]; anorexia [  ]; fatigue [  ]; nausea [  ]; night sweats [  ]; fever [  ]; or chills [  ];                                                                                                                                            Eye : blurred vision [  ]; diplopia [   ]; vision changes [  ];  Amaurosis fugax[  ]; Resp: cough [  ];  wheezing[  ];  hemoptysis[  ]; shortness of breath[  ]; paroxysmal nocturnal dyspnea[  ]; dyspnea on exertion[  ]; or orthopnea[  ];  GI:  gallstones[  ],  vomiting[  ];  dysphagia[  ]; melena[  ];  hematochezia [  ]; heartburn[  ];   Hx of  Colonoscopy[  ]; GU: kidney stones [  ]; hematuria[  ];   dysuria [  ];  nocturia[  ];  history of     obstruction [  ];                 Skin: rash, swelling[  ];, hair loss[  ];  peripheral edema[  ];  or itching[  ]; Musculosketetal: myalgias[  ];  joint swelling[  ];  joint erythema[  ];  joint pain[y  ];  back pain[  ];  Heme/Lymph: bruising[  ];  bleeding[  ];  anemia[  ];  Neuro: TIA[  ];  headaches[  ];  stroke[  ];  vertigo[  ];  seizures[  ];   paresthesias[  ];  difficulty walking[  ];  Psych:depression[  ]; anxiety[  ];  Endocrine: diabetes[  ];  thyroid dysfunction[  ];  Other:  Past Medical History  Diagnosis Date  . Hyperlipidemia   . Diabetes mellitus   . CHF (congestive heart failure)   . Hypertension   . Myocardial infarction   . Nephropathy   . Renal insufficiency   . CAD (coronary artery disease)   . Pulmonary nodule   . Rheumatoid arthritis(714.0)   . Hx of adenomatous colonic polyps     Medications Prior to Admission  Medication Sig Dispense Refill  . amLODipine (NORVASC) 10 MG tablet Take 10 mg by mouth daily.      Marland Kitchen aspirin EC 81 MG tablet Take 81 mg by mouth daily.      . furosemide (LASIX) 20 MG tablet Take 20 mg by mouth daily as needed for edema.      Marland Kitchen glyBURIDE (DIABETA) 5 MG tablet Take 5 mg by mouth 2 (two) times daily with a meal.      . hydrochlorothiazide (HYDRODIURIL) 25 MG tablet Take 25 mg by mouth daily.      . Insulin Glargine (LANTUS SOLOSTAR) 100 UNIT/ML SOPN Inject 12 Units into the  skin at bedtime as needed (for high cbg).      . memantine (NAMENDA) 5 MG tablet Take 5 mg by mouth 2 (two) times daily.      . metoprolol (LOPRESSOR) 50 MG tablet Take 1 tablet (50 mg total) by mouth 2 (two) times daily.  60 tablet  3  . nitroGLYCERIN (NITROSTAT) 0.4 MG SL tablet Place 0.4 mg under the tongue every 5 (five) minutes as needed. For chest pain      . glucose blood (ONE TOUCH TEST STRIPS) test strip 1 each by Other route as needed. Use as instructed      . methotrexate (RHEUMATREX) 2.5 MG tablet Take 12.5 mg by mouth once a week.      Marland Kitchen RELION MINI PEN NEEDLES 31G X 6 MM MISC USE EVERY DAY  100 each  0     . aspirin EC  81 mg Oral Daily  . insulin aspart  0-9 Units Subcutaneous TID WC  . insulin glargine  12 Units Subcutaneous QHS  . metoprolol  50 mg Oral BID  . [START ON 08/10/2013] pneumococcal 23 valent vaccine  0.5 mL Intramuscular Tomorrow-1000    Infusions:    No Known Allergies  History   Social History  . Marital Status: Married    Spouse Name: N/A    Number of Children: 4  .  Years of Education: N/A   Occupational History  . Retired [place] [employer]    Social History Main Topics  . Smoking status: Former Smoker    Quit date: 02/06/1989  . Smokeless tobacco: Former Neurosurgeon    Quit date: 02/06/2006  . Alcohol Use: No  . Drug Use: No  . Sexual Activity: Not Currently   Other Topics Concern  . Not on file   Social History Narrative  . No narrative on file    Family History  Problem Relation Age of Onset  . Stroke Mother   . Stroke Father     PHYSICAL EXAM: Filed Vitals:   08/09/13 1213  BP: 119/80  Pulse: 63  Temp: 99.2 F (37.3 C)  Resp:      Intake/Output Summary (Last 24 hours) at 08/09/13 1455 Last data filed at 08/09/13 1000  Gross per 24 hour  Intake   1240 ml  Output    200 ml  Net   1040 ml    General:  Well appearing. No respiratory difficulty HEENT: normal Neck: supple. JVP 8 Carotids 2+ bilat; no bruits. No  lymphadenopathy or thryomegaly appreciated. Cor: PMI nonpalpable. Distant HS Regular rate & rhythm. 2/6 AS Lungs: clear Abdomen: soft, nontender, nondistended. No hepatosplenomegaly. No bruits or masses. Good bowel sounds. Extremities: no cyanosis, clubbing, rash, tr edema Neuro: alert & oriented x 3, cranial nerves grossly intact. moves all 4 extremities w/o difficulty. Affect pleasant.  ECG: as per HPI.   Results for orders placed during the hospital encounter of 08/08/13 (from the past 24 hour(s))  CBC WITH DIFFERENTIAL     Status: Abnormal   Collection Time    08/08/13  9:02 PM      Result Value Range   WBC 8.3  4.0 - 10.5 K/uL   RBC 4.25  4.22 - 5.81 MIL/uL   Hemoglobin 13.2  13.0 - 17.0 g/dL   HCT 16.1  09.6 - 04.5 %   MCV 97.9  78.0 - 100.0 fL   MCH 31.1  26.0 - 34.0 pg   MCHC 31.7  30.0 - 36.0 g/dL   RDW 40.9  81.1 - 91.4 %   Platelets 96 (*) 150 - 400 K/uL   Neutrophils Relative % 55  43 - 77 %   Lymphocytes Relative 32  12 - 46 %   Monocytes Relative 7  3 - 12 %   Eosinophils Relative 5  0 - 5 %   Basophils Relative 1  0 - 1 %   Neutro Abs 4.5  1.7 - 7.7 K/uL   Lymphs Abs 2.7  0.7 - 4.0 K/uL   Monocytes Absolute 0.6  0.1 - 1.0 K/uL   Eosinophils Absolute 0.4  0.0 - 0.7 K/uL   Basophils Absolute 0.1  0.0 - 0.1 K/uL   RBC Morphology BURR CELLS    PRO B NATRIURETIC PEPTIDE     Status: Abnormal   Collection Time    08/08/13  9:03 PM      Result Value Range   Pro B Natriuretic peptide (BNP) 2702.0 (*) 0 - 450 pg/mL  GLUCOSE, CAPILLARY     Status: Abnormal   Collection Time    08/08/13  9:03 PM      Result Value Range   Glucose-Capillary 219 (*) 70 - 99 mg/dL  COMPREHENSIVE METABOLIC PANEL     Status: Abnormal   Collection Time    08/08/13  9:13 PM      Result Value Range  Sodium 138  135 - 145 mEq/L   Potassium 3.6  3.5 - 5.1 mEq/L   Chloride 100  96 - 112 mEq/L   CO2 15 (*) 19 - 32 mEq/L   Glucose, Bld 327 (*) 70 - 99 mg/dL   BUN 31 (*) 6 - 23 mg/dL    Creatinine, Ser 1.61 (*) 0.50 - 1.35 mg/dL   Calcium 8.1 (*) 8.4 - 10.5 mg/dL   Total Protein 6.6  6.0 - 8.3 g/dL   Albumin 2.8 (*) 3.5 - 5.2 g/dL   AST 93 (*) 0 - 37 U/L   ALT 59 (*) 0 - 53 U/L   Alkaline Phosphatase 72  39 - 117 U/L   Total Bilirubin 0.6  0.3 - 1.2 mg/dL   GFR calc non Af Amer 29 (*) >90 mL/min   GFR calc Af Amer 33 (*) >90 mL/min  POCT I-STAT, CHEM 8     Status: Abnormal   Collection Time    08/08/13  9:19 PM      Result Value Range   Sodium 141  135 - 145 mEq/L   Potassium 3.6  3.5 - 5.1 mEq/L   Chloride 106  96 - 112 mEq/L   BUN 32 (*) 6 - 23 mg/dL   Creatinine, Ser 0.96 (*) 0.50 - 1.35 mg/dL   Glucose, Bld 045 (*) 70 - 99 mg/dL   Calcium, Ion 4.09 (*) 1.13 - 1.30 mmol/L   TCO2 17  0 - 100 mmol/L   Hemoglobin 15.0  13.0 - 17.0 g/dL   HCT 81.1  91.4 - 78.2 %  POCT I-STAT TROPONIN I     Status: None   Collection Time    08/08/13  9:19 PM      Result Value Range   Troponin i, poc 0.02  0.00 - 0.08 ng/mL   Comment 3           CG4 I-STAT (LACTIC ACID)     Status: Abnormal   Collection Time    08/08/13  9:24 PM      Result Value Range   Lactic Acid, Venous 9.78 (*) 0.5 - 2.2 mmol/L  POCT I-STAT 3, BLOOD GAS (G3+)     Status: Abnormal   Collection Time    08/08/13  9:25 PM      Result Value Range   pH, Arterial 7.206 (*) 7.350 - 7.450   pCO2 arterial 33.8 (*) 35.0 - 45.0 mmHg   pO2, Arterial 95.0  80.0 - 100.0 mmHg   Bicarbonate 13.4 (*) 20.0 - 24.0 mEq/L   TCO2 14  0 - 100 mmol/L   O2 Saturation 96.0     Acid-base deficit 13.0 (*) 0.0 - 2.0 mmol/L   Patient temperature 98.6 F     Collection site RADIAL, ALLEN'S TEST ACCEPTABLE     Drawn by RT     Sample type ARTERIAL    POCT I-STAT 3, BLOOD GAS (G3+)     Status: Abnormal   Collection Time    08/08/13 11:09 PM      Result Value Range   pH, Arterial 7.362  7.350 - 7.450   pCO2 arterial 35.7  35.0 - 45.0 mmHg   pO2, Arterial 66.0 (*) 80.0 - 100.0 mmHg   Bicarbonate 20.2  20.0 - 24.0 mEq/L   TCO2  21  0 - 100 mmol/L   O2 Saturation 92.0     Acid-base deficit 5.0 (*) 0.0 - 2.0 mmol/L   Patient temperature 98.6 F  Collection site RADIAL, ALLEN'S TEST ACCEPTABLE     Drawn by RT     Sample type ARTERIAL    CG4 I-STAT (LACTIC ACID)     Status: Abnormal   Collection Time    08/08/13 11:24 PM      Result Value Range   Lactic Acid, Venous 4.27 (*) 0.5 - 2.2 mmol/L  PROCALCITONIN     Status: None   Collection Time    08/09/13 12:40 AM      Result Value Range   Procalcitonin 7.90    MRSA PCR SCREENING     Status: None   Collection Time    08/09/13  1:06 AM      Result Value Range   MRSA by PCR NEGATIVE  NEGATIVE  URINALYSIS, ROUTINE W REFLEX MICROSCOPIC     Status: Abnormal   Collection Time    08/09/13  1:12 AM      Result Value Range   Color, Urine YELLOW  YELLOW   APPearance CLOUDY (*) CLEAR   Specific Gravity, Urine 1.019  1.005 - 1.030   pH 5.0  5.0 - 8.0   Glucose, UA >1000 (*) NEGATIVE mg/dL   Hgb urine dipstick LARGE (*) NEGATIVE   Bilirubin Urine NEGATIVE  NEGATIVE   Ketones, ur NEGATIVE  NEGATIVE mg/dL   Protein, ur 409 (*) NEGATIVE mg/dL   Urobilinogen, UA 1.0  0.0 - 1.0 mg/dL   Nitrite NEGATIVE  NEGATIVE   Leukocytes, UA NEGATIVE  NEGATIVE  URINE RAPID DRUG SCREEN (HOSP PERFORMED)     Status: None   Collection Time    08/09/13  1:12 AM      Result Value Range   Opiates NONE DETECTED  NONE DETECTED   Cocaine NONE DETECTED  NONE DETECTED   Benzodiazepines NONE DETECTED  NONE DETECTED   Amphetamines NONE DETECTED  NONE DETECTED   Tetrahydrocannabinol NONE DETECTED  NONE DETECTED   Barbiturates NONE DETECTED  NONE DETECTED  URINE MICROSCOPIC-ADD ON     Status: Abnormal   Collection Time    08/09/13  1:12 AM      Result Value Range   Squamous Epithelial / LPF RARE  RARE   WBC, UA 0-2  <3 WBC/hpf   RBC / HPF 7-10  <3 RBC/hpf   Bacteria, UA FEW (*) RARE   Casts GRANULAR CAST (*) NEGATIVE  MAGNESIUM     Status: None   Collection Time    08/09/13  2:00 AM       Result Value Range   Magnesium 2.2  1.5 - 2.5 mg/dL  PHOSPHORUS     Status: None   Collection Time    08/09/13  2:00 AM      Result Value Range   Phosphorus 4.4  2.3 - 4.6 mg/dL  LIPID PANEL     Status: Abnormal   Collection Time    08/09/13  2:00 AM      Result Value Range   Cholesterol 169  0 - 200 mg/dL   Triglycerides 811  <914 mg/dL   HDL 37 (*) >78 mg/dL   Total CHOL/HDL Ratio 4.6     VLDL 26  0 - 40 mg/dL   LDL Cholesterol 295 (*) 0 - 99 mg/dL  ETHANOL     Status: None   Collection Time    08/09/13  2:20 AM      Result Value Range   Alcohol, Ethyl (B) <11  0 - 11 mg/dL  TROPONIN I  Status: Abnormal   Collection Time    08/09/13  2:20 AM      Result Value Range   Troponin I 0.90 (*) <0.30 ng/mL  PRO B NATRIURETIC PEPTIDE     Status: Abnormal   Collection Time    08/09/13  2:20 AM      Result Value Range   Pro B Natriuretic peptide (BNP) 3120.0 (*) 0 - 450 pg/mL  LACTIC ACID, PLASMA     Status: None   Collection Time    08/09/13  2:20 AM      Result Value Range   Lactic Acid, Venous 2.1  0.5 - 2.2 mmol/L  PRO B NATRIURETIC PEPTIDE     Status: Abnormal   Collection Time    08/09/13  6:12 AM      Result Value Range   Pro B Natriuretic peptide (BNP) 5068.0 (*) 0 - 450 pg/mL  COMPREHENSIVE METABOLIC PANEL     Status: Abnormal   Collection Time    08/09/13  6:12 AM      Result Value Range   Sodium 137  135 - 145 mEq/L   Potassium 4.0  3.5 - 5.1 mEq/L   Chloride 104  96 - 112 mEq/L   CO2 21  19 - 32 mEq/L   Glucose, Bld 144 (*) 70 - 99 mg/dL   BUN 34 (*) 6 - 23 mg/dL   Creatinine, Ser 1.61 (*) 0.50 - 1.35 mg/dL   Calcium 8.0 (*) 8.4 - 10.5 mg/dL   Total Protein 6.3  6.0 - 8.3 g/dL   Albumin 2.7 (*) 3.5 - 5.2 g/dL   AST 096 (*) 0 - 37 U/L   ALT 132 (*) 0 - 53 U/L   Alkaline Phosphatase 77  39 - 117 U/L   Total Bilirubin 0.5  0.3 - 1.2 mg/dL   GFR calc non Af Amer 30 (*) >90 mL/min   GFR calc Af Amer 35 (*) >90 mL/min  CBC     Status: Abnormal    Collection Time    08/09/13  6:12 AM      Result Value Range   WBC 5.6  4.0 - 10.5 K/uL   RBC 4.05 (*) 4.22 - 5.81 MIL/uL   Hemoglobin 12.3 (*) 13.0 - 17.0 g/dL   HCT 04.5 (*) 40.9 - 81.1 %   MCV 92.1  78.0 - 100.0 fL   MCH 30.4  26.0 - 34.0 pg   MCHC 33.0  30.0 - 36.0 g/dL   RDW 91.4  78.2 - 95.6 %   Platelets 87 (*) 150 - 400 K/uL  TROPONIN I     Status: Abnormal   Collection Time    08/09/13  8:15 AM      Result Value Range   Troponin I 2.32 (*) <0.30 ng/mL  GLUCOSE, CAPILLARY     Status: None   Collection Time    08/09/13  8:42 AM      Result Value Range   Glucose-Capillary 90  70 - 99 mg/dL  TROPONIN I     Status: Abnormal   Collection Time    08/09/13 10:15 AM      Result Value Range   Troponin I 2.66 (*) <0.30 ng/mL  PROTIME-INR     Status: None   Collection Time    08/09/13 10:15 AM      Result Value Range   Prothrombin Time 15.1  11.6 - 15.2 seconds   INR 1.22  0.00 - 1.49  APTT  Status: Abnormal   Collection Time    08/09/13 10:15 AM      Result Value Range   aPTT 38 (*) 24 - 37 seconds  GLUCOSE, CAPILLARY     Status: Abnormal   Collection Time    08/09/13  1:20 PM      Result Value Range   Glucose-Capillary 109 (*) 70 - 99 mg/dL   Ct Head Wo Contrast  08/08/2013   CLINICAL DATA:  Unresponsive  EXAM: CT HEAD WITHOUT CONTRAST  TECHNIQUE: Contiguous axial images were obtained from the base of the skull through the vertex without intravenous contrast.  COMPARISON:  Prior CT from 06/02/2012  FINDINGS: Extensive age-related atrophy and chronic microvascular ischemic disease there is again seen, similar as compared to the prior examination. Ventricular size is unchanged. Prominent bilateral basal ganglia calcifications are noted. No acute intracranial hemorrhage or large vessel territory infarct. No extra-axial fluid collection. Remote right cerebellar infarct and right parietal infarcts again noted. No mass lesion or midline shift. Calvarium is intact. Orbital  soft tissues are within normal limits. The paranasal sinuses and mastoid air cells are clear.  IMPRESSION: 1. No acute intracranial process. 2. Stable appearance of extensive global atrophy and chronic microvascular ischemic disease. 3. Remote right cerebellar and right frontoparietal infarcts.   Electronically Signed   By: Rise Mu M.D.   On: 08/08/2013 22:34   Matthew Brain Wo Contrast  08/09/2013   CLINICAL DATA:  Sudden onset of altered mental status. Stroke risk factors include hypertension, hyperlipidemia, diabetes, and renal failure.  EXAM: MRI HEAD WITHOUT CONTRAST  TECHNIQUE: Multiplanar, multisequence Matthew imaging was performed. No intravenous contrast was administered.  COMPARISON:  CT head 08/08/2013.  FINDINGS: Acute subcentimeter right frontal cortical infarction (image 26 series 3). Acute left posterior frontal subcentimeter cortical infarct (image 29 series 3). Probable acute right posterior temporal subcentimeter infarct (image 12 series 3), persists on thin DWI series. Attention to this area on followup Matthew as clinically indicated.  Severe cerebral and cerebellar atrophy. Remote bilateral cortical infarcts with brain substance loss, most notable right parietal region. Remote bilateral right greater than left cerebellar infarcts. Extensive chronic microvascular ischemic change throughout the periventricular and subcortical white matter.  Small foci of chronic hemorrhage are scattered throughout both cerebral hemispheres likely sequelae of hypertensive cerebrovascular disease. No midline shift. Flow voids are maintained. No osseous lesions. No acute mastoid disease. Small amount of left sphenoid sinus fluid could be acute.  IMPRESSION: At least two and probably three acute subcentimeter infarcts as described. Suspect shower of emboli.  Advanced atrophy and chronic microvascular ischemic change with numerous remote infarcts and microbleeds   Electronically Signed   By: Davonna Belling M.D.   On:  08/09/2013 12:08   Dg Chest Portable 1 View  08/08/2013   CLINICAL DATA:  Altered mental status. Hypertension. Atrial fibrillation  EXAM: PORTABLE CHEST - 1 VIEW  COMPARISON:  06/02/2012  FINDINGS: Chronic cardiomegaly, stable from prior. Mediastinal contours distorted by rightward rotation. Chronic interstitial coarsening at the bases. No acute infiltrate or edema. No effusion or pneumothorax. Linear high attenuation overlapping the lower right neck is likely either the thyroid cartilage or external to the patient.No history of neck pain.  IMPRESSION: Negative for edema or consolidation.   Electronically Signed   By: Tiburcio Pea M.D.   On: 08/08/2013 21:26    ASSESSMENT:  1. Syncope - suspect aborted sudden cardiac death due to VT VF 2 . Shock/lactic acidosis - likely due to #1. Now  improved 3. NSTEMI - primary vs demand ischemia 4. Ischemic cardiomyopathy EF 25-30% 5. CAD s/p previous stenting - last cath 2008 6. CKD, stage III (baseline Cr ~ 2.0) 7. NSVT 8. Hypokalemia 9. Aortic stenosis  PLAN/DISCUSSION:  Suspect he had episode of aborted sudden cardiac due to VT/VF in setting of severe ischemic cardiomyopathy. I think NSTEMI is likely secondary to VT/VF but may also be trigger. Will likely need cath followed by ICD. Will need to be careful with cath due to risk of worsening renal failure with contrast exposure. Will d/w EP.  For now will keep on tele. Supp electrolytes K+> 4.0 Mg > 2.0. Continue b-blocker. No ACE-I due to renal failure. Diurese gently. Will check echo. Probable cath Monday.   Daniel Bensimhon,MD 3:02 PM  Addendum:  Brain MRI with:  At least two and probably three acute subcentimeter infarcts. Suspect shower of emboli.  Advanced atrophy and chronic microvascular ischemic change with numerous remote infarcts and microbleeds   Will await neuro input. My suspicion for VT/VF remains high.   Daniel Bensimhon,MD 3:14 PM

## 2013-08-09 NOTE — Progress Notes (Signed)
ANTICOAGULATION CONSULT NOTE - Initial Consult  Pharmacy Consult for heparin Indication: chest pain/ACS  No Known Allergies  Patient Measurements: Height: 6\' 1"  (185.4 cm) Weight: 212 lb 8.4 oz (96.4 kg) IBW/kg (Calculated) : 79.9 Heparin Dosing Weight: 96kg  Vital Signs: Temp: 98.6 F (37 C) (11/01 0841) Temp src: Oral (11/01 0841) BP: 125/59 mmHg (11/01 0700) Pulse Rate: 67 (11/01 0700)  Labs:  Recent Labs  08/08/13 2102 08/08/13 2113 08/08/13 2119 08/09/13 0220 08/09/13 0612 08/09/13 0815  HGB 13.2  --  15.0  --  12.3*  --   HCT 41.6  --  44.0  --  37.3*  --   PLT 96*  --   --   --  87*  --   CREATININE  --  2.09* 2.20*  --  1.99*  --   TROPONINI  --   --   --  0.90*  --  2.32*    Estimated Creatinine Clearance: 37.4 ml/min (by C-G formula based on Cr of 1.99).   Medical History: Past Medical History  Diagnosis Date  . Hyperlipidemia   . Diabetes mellitus   . CHF (congestive heart failure)   . Hypertension   . Myocardial infarction   . Nephropathy   . Renal insufficiency   . CAD (coronary artery disease)   . Pulmonary nodule   . Rheumatoid arthritis(714.0)   . Hx of adenomatous colonic polyps     Assessment: 59 YOM who initially presented with change in mental status and then was found to have positive troponins and is to start heparin. He received a prophylactic dose of Lovenox this morning of 40mg  (about 0.5mg /kg for this patient) at 0500 this morning. At baseline his Hgb is 12.3 and platelets are 8- he has had thrombocytopenia this year with the previous recorded value of 142 in September. No bleeding noted. Not on anticoagulants PTA.  Goal of Therapy:  Heparin level 0.3-0.7 units/ml Monitor platelets by anticoagulation protocol: Yes   Plan:  1. Stat PT/INR and aPTT to assess baseline 2. Heparin bolus with 2000 units IV x1 as patient has recently received Lovenox 3. Start heparin gtt at 1200units/hr (about 12units/kg/hr) 4. Heparin level in 8  hours 5. Daily HL and CBC- follow closely as low platelets 6. Follow for any reports of bleeding 7. Follow up any plans for intervention  Shanaia Sievers D. Jhalen Eley, PharmD Clinical Pharmacist Pager: [phone] 08/09/2013 10:15 AM

## 2013-08-09 NOTE — Procedures (Signed)
History: 30 atrial male with altered mental status  Background: The background consists of intermixed alpha and beta activities. There is an increase in delta activity associated with drowsiness. There is no sleep recorded. There is a well defined posterior dominant rhythm of 9.5 Hz that attenuates with eye opening.   Photic stimulation: Physiologic driving is not performed  EEG Abnormalities: None  Clinical Interpretation: This normal EEG is recorded in the waking and drowsy state. There was no seizure or seizure predisposition recorded on this study.   Ritta Slot, MD [facility] [phone]  If 7pm- 7am, please page neurology on call at [phone].

## 2013-08-09 NOTE — Progress Notes (Signed)
[facility] PROGRESS NOTE  DEYTON ELLENBECKER OZH:[id number] DOB: 1933-01-21 DOA: 08/08/2013 PCP: [name], MD  Brief narrative: Pt is 79 yo male with complicated medical conditions including HTN, HLD, DM, systolic and diastolic CHF, presented to [facility] ED with main concern of sudden onset altered mental status. Pt is unable to provide history due to confusion and family at bedside unable to provide details as they have not witnessed the events. There has been no reported fevers, chills, no chest pain. In ED, pt found to be initially lethargic with lactic acid > 9, with pH 7.2 and pO2 60. Pt was given 500 cc NS and his mental status improved, lactic acid down to 4 and oxygen saturation 100% on 4 L [place]. Source of this acute event unclear at this time.  EEG ordered and MRI brain is pending.  He has had a rise in Troponin, which could be due to acute hypotension.     Active Problems:   Altered mental state  - possibly multifactorial in etiology.  He is more alert this morning and specifically denying any chest pain, recent illness, fever, SOB, nausea, vomiting, diarrhea or any changes.  Last time taking nitroglycerin was "weeks" ago.  - EKG appeared unchanged, repeat from this AM also unchanged - TnI X 2 0.9 -> 2.32 -> 2.62.  Many etiologies could cause this including repeat stroke, hypotension (he was hypotensive on arrival).  I have consulted Cardiology prior to starting Heparin drip.  - pt has history of stroke, will order MRI brain for further evaluation which has shown 3 acute subcentimeter infarcts, suggesting shower of emboli.  Will order TTE, CD and consult Neurology.  - Denies recent hypoglycemia and ETOH use.  D/C CIWA - ? Seizure, order EEG - done, awaiting interpretation - UA with large Hgb and moderate protein; urine culture and blood culture pending.  - PT eval once more alert   Acute on chronic renal failure  - secondary to poor oral intake and use of Lasix, however, repeat Cr  appears close to baseline.   - hold home lasix and will monitor I's and O's closely  - BMP daily  Acute respiratory failure with hypoxia  - resolved, patient off O2 this AM.  Likely related to AMS  Lactic acidosis  - unclear etiology but now trending down, last check was normal - will repeat again but possibly related to onset of seizure  - EEG ordered, awaiting reading  Transaminitis  - ? Alcohol use, patient denies - Monitor CMET, ? Due to stroke   DM (diabetes mellitus), type 2, uncontrolled with complications  - check A1C  - Now that he is more alert, will restart home lantus with SSI - Adjust as needed   HYPERLIPIDEMIA  - LDL 106, no need to start statin, particularly in setting of transaminitis.   HYPERTENSION  - BP much improved, now in the 110s-130s systolic - continue to hold Norvasc and Lasix, continue only Metoprolol for now  - monitor vitals per floor protocol   CONGESTIVE HEART FAILURE  - per last 2 D ECHO in 2013 grade I diastolic and EF 30%  - repeat TTE ordered - BNP elevated to 5K, monitor daily weights, I's and O's , cardiology consult - Continuing to hold lasix for now.    Code Status: Full  Family Communication: No family at bedside  Disposition Plan: Admit to stepdown units  Consultants:  Cardiology  Neurology  Procedures/Studies: Ct Head Wo Contrast  08/08/2013   CLINICAL  DATA:  Unresponsive  EXAM: CT HEAD WITHOUT CONTRAST  TECHNIQUE: Contiguous axial images were obtained from the base of the skull through the vertex without intravenous contrast.  COMPARISON:  Prior CT from 06/02/2012  FINDINGS: Extensive age-related atrophy and chronic microvascular ischemic disease there is again seen, similar as compared to the prior examination. Ventricular size is unchanged. Prominent bilateral basal ganglia calcifications are noted. No acute intracranial hemorrhage or large vessel territory infarct. No extra-axial fluid collection. Remote right cerebellar  infarct and right parietal infarcts again noted. No mass lesion or midline shift. Calvarium is intact. Orbital soft tissues are within normal limits. The paranasal sinuses and mastoid air cells are clear.  IMPRESSION: 1. No acute intracranial process. 2. Stable appearance of extensive global atrophy and chronic microvascular ischemic disease. 3. Remote right cerebellar and right frontoparietal infarcts.   Electronically Signed   By: Rise Mu M.D.   On: 08/08/2013 22:34   Dg Chest Portable 1 View  08/08/2013   CLINICAL DATA:  Altered mental status. Hypertension. Atrial fibrillation  EXAM: PORTABLE CHEST - 1 VIEW  COMPARISON:  06/02/2012  FINDINGS: Chronic cardiomegaly, stable from prior. Mediastinal contours distorted by rightward rotation. Chronic interstitial coarsening at the bases. No acute infiltrate or edema. No effusion or pneumothorax. Linear high attenuation overlapping the lower right neck is likely either the thyroid cartilage or external to the patient.No history of neck pain.  IMPRESSION: Negative for edema or consolidation.   Electronically Signed   By: Tiburcio Pea M.D.   On: 08/08/2013 21:26     EKG:   Antibiotics:  None  Code Status: Full Family Communication: Pt at bedside Disposition Plan: Home when medically stable  HPI/Subjective: Patient more alert this morning.  Reports not remember the event that brought him in, but remembering that his wife told him he slumped over on the commode.  He reports no preceding event.  He specifically denies chest pain, SOB, recent illness, seizure like activity, recent dizziness.  He does have residual weakness from a previous stroke but notes that "I don't think I had a stroke."  He reports not needing to use his prn nitro in "a long time."    Objective: Filed Vitals:   08/09/13 0400 08/09/13 0500 08/09/13 0700 08/09/13 0841  BP: 112/59 109/60 125/59   Pulse: 73 72 67   Temp:    98.6 F (37 C)  TempSrc:    Oral  Resp:  24 23 24    Height:      Weight:      SpO2: 98% 98% 99%     Intake/Output Summary (Last 24 hours) at 08/09/13 1011 Last data filed at 08/09/13 0100  Gross per 24 hour  Intake   1000 ml  Output    200 ml  Net    800 ml    Exam:   General:  Pt is alert, follows commands appropriately, not in acute distress  Cardiovascular: Regular rate and rhythm, S1/S2, no murmurs noted  Respiratory: Clear to auscultation bilaterally, no wheezing  Abdomen: Soft,  bowel sounds present  Extremities: Trace edema, pulses DP and PT palpable bilaterally  Neuro:  Strength is 5/5 in BLE, 4/5 in BUE  Data Reviewed: Basic Metabolic Panel:  Recent Labs Lab 08/08/13 2113 08/08/13 2119 08/09/13 0200 08/09/13 0612  NA 138 141  --  137  K 3.6 3.6  --  4.0  CL 100 106  --  104  CO2 15*  --   --  21  GLUCOSE 327* 276*  --  144*  BUN 31* 32*  --  34*  CREATININE 2.09* 2.20*  --  1.99*  CALCIUM 8.1*  --   --  8.0*  MG  --   --  2.2  --   PHOS  --   --  4.4  --    Liver Function Tests:  Recent Labs Lab 08/08/13 2113 08/09/13 0612  AST 93* 153*  ALT 59* 132*  ALKPHOS 72 77  BILITOT 0.6 0.5  PROT 6.6 6.3  ALBUMIN 2.8* 2.7*   No results found for this basename: LIPASE, AMYLASE,  in the last 168 hours No results found for this basename: AMMONIA,  in the last 168 hours CBC:  Recent Labs Lab 08/08/13 2102 08/08/13 2119 08/09/13 0612  WBC 8.3  --  5.6  NEUTROABS 4.5  --   --   HGB 13.2 15.0 12.3*  HCT 41.6 44.0 37.3*  MCV 97.9  --  92.1  PLT 96*  --  87*   Cardiac Enzymes:  Recent Labs Lab 08/09/13 0220 08/09/13 0815  TROPONINI 0.90* 2.32*   BNP: No components found with this basename: POCBNP,  CBG:  Recent Labs Lab 08/08/13 2103 08/09/13 0842  GLUCAP 219* 90    Recent Results (from the past 240 hour(s))  MRSA PCR SCREENING     Status: None   Collection Time    08/09/13  1:06 AM      Result Value Range Status   MRSA by PCR NEGATIVE  NEGATIVE Final   Comment:             The GeneXpert MRSA Assay (FDA     approved for NASAL specimens     only), is one component of a     comprehensive MRSA colonization     surveillance program. It is not     intended to diagnose MRSA     infection nor to guide or     monitor treatment for     MRSA infections.     Scheduled Meds: . aspirin EC  81 mg Oral Daily  . heparin  2,000 Units Intravenous Once  . insulin aspart  0-9 Units Subcutaneous TID WC  . metoprolol  50 mg Oral BID  . [START ON 08/10/2013] pneumococcal 23 valent vaccine  0.5 mL Intramuscular Tomorrow-1000   Continuous Infusions: . heparin       Debe Coder, MD  [facility] Pager [phone]  If 7PM-7AM, please contact night-coverage [email or web address] Password [email or web address] 08/09/2013, 10:11 AM   LOS: 1 day

## 2013-08-09 NOTE — Consult Note (Signed)
Referring Physician: Dr. Gonzella Lex    Chief Complaint: abnormal MRI with small multiple strokes.  HPI: Matthew Bean is an 79 y.o. male Mr. diabetes mellitus, hypertension, hyperlipidemia, coronary artery disease and congestive heart failure, who was brought to the hospital following an episode of loss of consciousness at home thought to likely have been syncopal in nature with possible transient ventricular tachycardia or fibrillation. Lactic acid was elevated on admission but subsequently improved. He was found to be in atrial fibrillation on presentation in the emergency room with no apparent previous documentation of atrial fibrillation. His been on aspirin for antiplatelet therapy. Patient was felt to have suffered a NSTEMI. MRI of his brain was obtained as part of his workup altered mental status. Study showed small likely embolic strokes involving the right posterior frontal and right posterior temporal regions. No focal deficits were described. NIH stroke score is 0. He was last known well at 5:30 PM on 08/08/2013.  LSN: 5:30 PM on 08/08/2013 tPA Given: No: No objective deficits; beyond time window for treatment consideration. MRankin: 0  Past Medical History  Diagnosis Date  . Hyperlipidemia   . Diabetes mellitus   . CHF (congestive heart failure)   . Hypertension   . Myocardial infarction   . Nephropathy   . Renal insufficiency   . CAD (coronary artery disease)   . Pulmonary nodule   . Rheumatoid arthritis(714.0)   . Hx of adenomatous colonic polyps     Family History  Problem Relation Age of Onset  . Stroke Mother   . Stroke Father      Medications: I have reviewed the patient's current medications.  ROS: History obtained from chart review and the patient  General ROS: negative for - chills, fatigue, fever, night sweats, weight gain or weight loss Psychological ROS: negative for - behavioral disorder, hallucinations, memory difficulties, mood swings or suicidal  ideation Ophthalmic ROS: negative for - blurry vision, double vision, eye pain or loss of vision ENT ROS: negative for - epistaxis, nasal discharge, oral lesions, sore throat, tinnitus or vertigo Allergy and Immunology ROS: negative for - hives or itchy/watery eyes Hematological and Lymphatic ROS: negative for - bleeding problems, bruising or swollen lymph nodes Endocrine ROS: negative for - galactorrhea, hair pattern changes, polydipsia/polyuria or temperature intolerance Respiratory ROS: negative for - cough, hemoptysis, shortness of breath or wheezing Cardiovascular ROS: negative for - chest pain, dyspnea on exertion, edema or irregular heartbeat Gastrointestinal ROS: negative for - abdominal pain, diarrhea, hematemesis, nausea/vomiting or stool incontinence Genito-Urinary ROS: negative for - dysuria, hematuria, incontinence or urinary frequency/urgency Musculoskeletal ROS: negative for - joint swelling or muscular weakness Neurological ROS: as noted in HPI Dermatological ROS: negative for rash and skin lesion changes  Physical Examination: Blood pressure 126/58, pulse 69, temperature 99.2 F (37.3 C), temperature source Oral, resp. rate 26, height 6\' 1"  (1.854 m), weight 96.4 kg (212 lb 8.4 oz), SpO2 100.00%.  Neurologic Examination: Mental Status: Alert, oriented, thought content appropriate.  Speech fluent without evidence of aphasia. Able to follow commands without difficulty. Cranial Nerves: II-Visual fields were normal. III/IV/VI-Pupils were equal and reacted. Extraocular movements were full and conjugate.    V/VII-no facial numbness and no facial weakness. VIII-normal. X-normal speech and symmetrical palatal movement. Motor: 5/5 bilaterally with normal tone and bulk Sensory: Normal throughout. Deep Tendon Reflexes: 1+ and symmetric in upper extremities; absent at knees and ankles. Plantars: Mute bilaterally Cerebellar: Normal finger-to-nose testing. Carotid auscultation:  Normal  Ct Head Wo Contrast  08/08/2013  CLINICAL DATA:  Unresponsive  EXAM: CT HEAD WITHOUT CONTRAST  TECHNIQUE: Contiguous axial images were obtained from the base of the skull through the vertex without intravenous contrast.  COMPARISON:  Prior CT from 06/02/2012  FINDINGS: Extensive age-related atrophy and chronic microvascular ischemic disease there is again seen, similar as compared to the prior examination. Ventricular size is unchanged. Prominent bilateral basal ganglia calcifications are noted. No acute intracranial hemorrhage or large vessel territory infarct. No extra-axial fluid collection. Remote right cerebellar infarct and right parietal infarcts again noted. No mass lesion or midline shift. Calvarium is intact. Orbital soft tissues are within normal limits. The paranasal sinuses and mastoid air cells are clear.  IMPRESSION: 1. No acute intracranial process. 2. Stable appearance of extensive global atrophy and chronic microvascular ischemic disease. 3. Remote right cerebellar and right frontoparietal infarcts.   Electronically Signed   By: Rise Mu M.D.   On: 08/08/2013 22:34   Mr Brain Wo Contrast  08/09/2013   CLINICAL DATA:  Sudden onset of altered mental status. Stroke risk factors include hypertension, hyperlipidemia, diabetes, and renal failure.  EXAM: MRI HEAD WITHOUT CONTRAST  TECHNIQUE: Multiplanar, multisequence MR imaging was performed. No intravenous contrast was administered.  COMPARISON:  CT head 08/08/2013.  FINDINGS: Acute subcentimeter right frontal cortical infarction (image 26 series 3). Acute left posterior frontal subcentimeter cortical infarct (image 29 series 3). Probable acute right posterior temporal subcentimeter infarct (image 12 series 3), persists on thin DWI series. Attention to this area on followup MR as clinically indicated.  Severe cerebral and cerebellar atrophy. Remote bilateral cortical infarcts with brain substance loss, most notable right  parietal region. Remote bilateral right greater than left cerebellar infarcts. Extensive chronic microvascular ischemic change throughout the periventricular and subcortical white matter.  Small foci of chronic hemorrhage are scattered throughout both cerebral hemispheres likely sequelae of hypertensive cerebrovascular disease. No midline shift. Flow voids are maintained. No osseous lesions. No acute mastoid disease. Small amount of left sphenoid sinus fluid could be acute.  IMPRESSION: At least two and probably three acute subcentimeter infarcts as described. Suspect shower of emboli.  Advanced atrophy and chronic microvascular ischemic change with numerous remote infarcts and microbleeds   Electronically Signed   By: Davonna Belling M.D.   On: 08/09/2013 12:08   Dg Chest Portable 1 View  08/08/2013   CLINICAL DATA:  Altered mental status. Hypertension. Atrial fibrillation  EXAM: PORTABLE CHEST - 1 VIEW  COMPARISON:  06/02/2012  FINDINGS: Chronic cardiomegaly, stable from prior. Mediastinal contours distorted by rightward rotation. Chronic interstitial coarsening at the bases. No acute infiltrate or edema. No effusion or pneumothorax. Linear high attenuation overlapping the lower right neck is likely either the thyroid cartilage or external to the patient.No history of neck pain.  IMPRESSION: Negative for edema or consolidation.   Electronically Signed   By: Tiburcio Pea M.D.   On: 08/08/2013 21:26    Assessment: 79 y.o. male with multiple risk factors for stroke presenting with 2 small likely embolic right MCA territory ischemic strokes. There is no clear indication of more proximal source of emboli. NIH stroke scale of 0. Stroke Risk Factors - diabetes mellitus, hyperlipidemia and hypertension  Plan: 1. HgbA1c, fasting lipid panel 2. MRI, MRA  of the brain without contrast 3. PT consult, OT consult 4. Echocardiogram 5. Carotid dopplers 6. Prophylactic therapy-Antiplatelet med: Plavix 7. Risk  factor modification 8. Telemetry monitoring    C.R. Roseanne Reno, MD [facility] [phone]  08/09/2013, 8:19 PM

## 2013-08-09 NOTE — ED Provider Notes (Signed)
Medical screening examination/treatment/procedure(s) were conducted as a shared visit with resident physician and myself.  I personally evaluated the patient during the encounter.   Date: 08/09/2013  Rate: 72  Rhythm: normal sinus rhythm  QRS Axis: normal  Intervals: normal  ST/T Wave abnormalities: nonspecific T wave changes  Conduction Disutrbances:none  Narrative Interpretation: PVC, t wave inversions in inferior and anterolateral leads which appear unchanged from previous ecg  Old EKG Reviewed: unchanged  I interviewed and examined the patient. Lungs are CTAB. Cardiac exam wnl. Abdomen soft.  Pt initially tachypneic and sob which improved with O2 during his stay. Gross lab abnormalities also normalizing rapidly. It is possible the pt had a seizure causing these gross abnormalities which are rapidly resolving. No hx of seizures. Will admit to hospitalist for obs.    Junius Argyle, MD 08/09/13 1054

## 2013-08-09 NOTE — Progress Notes (Signed)
CRITICAL VALUE ALERT  Critical value received:  Troponin 0.90  Date of notification:  11/1   Time of notification:  0345  Critical value read back:yes  Nurse who received alert:  Edyn Qazi rn   MD notified (1st page):  Daphane Shepherd NP  Time of first page:  0345  MD notified (2nd page):  Time of second page:  Responding MD:    Time MD responded:

## 2013-08-09 NOTE — H&P (Addendum)
[facility] History and Physical  SHEM PLEMMONS ZOX:[id number] DOB: Jul 09, 1933 DOA: 08/08/2013  Referring physician: ED physician PCP: [name], MD   Chief Complaint: AMS  HPI:  Pt is 79 yo male with complicated and multiple medical conditions including HTN, HLD, DM, systolic and diastolic CHF, now presented to [facility] ED with main concern of sudden onset altered mental status. Pt is unable to provide history due to confusion and family at bedside unable to provide details as they have not witnessed the events. There has been no reported fevers, chills, no chest pain. In ED, pt found to be initially lethargic with lactic acid > 9, with pH 7.2 and pO2 60. Pt was given 500 cc NS and his mental status improved, lactic acid down to 4 and oxygen saturation 100% on 4 L [place]. Source of this acute event unclear and [facility] asked to admit to SDU for further evaluation.   Assessment and Plan:  Principal Problem:   Altered mental state - possibly multifactorial in etiology - pt has history of stroke, will order MRI brain for further evaluation - possible hypoglycemia, alcohol use - ? Seizure, order EEF - check alcohol level and place on CIWA - check UDS, UA and urine culture and blood culture - PT eval once more alert  Active Problems:   Acute on chronic renal failure - secondary to poor oral intake and use of Lasix - hold home lasix and will monitor I's and O's closely - BMP in AM   Acute respiratory failure with hypoxia - unclear etiology - CXR unremarkable for acute events - will monitor closely in SDU   Lactic acidosis - unclear etiology but now trending down - will repeat again but possibly related to onset of seizure - EEG ordered    Transaminitis - ? Alcohol use - will check alcohol level and will place on CIWA protocol  - assess use once pt more alert  - CMET in AM   DM (diabetes mellitus), type 2, uncontrolled with complications - check A1C - hold insulin for now - hold  oral anti hyperglycemics and place on SSI only and readjust as indicated    HYPERLIPIDEMIA - check lipid panel    HYPERTENSION - BP much improved at this point - will hold Norvasc and Lasix, continue Metoprolol for now  - monitor vitals per floor protocol   CONGESTIVE HEART FAILURE - per last 2 D ECHO in 2013 grade I diastolic and EF 30% - will repeat 2 D ECHO - check BNP and monitor daily weights, I's and O's - hold Lasix as pt is clinically dry, hold IVF as he has received total of 1.5 Liters in ED   Code Status: Full Family Communication: No family at bedside  Disposition Plan: Admit to stepdown units   Review of Systems:  Unable to obtain due to altered mental status     Past Medical History  Diagnosis Date  . Hyperlipidemia   . Diabetes mellitus   . CHF (congestive heart failure)   . Hypertension   . Myocardial infarction   . Nephropathy   . Renal insufficiency   . CAD (coronary artery disease)   . Pulmonary nodule   . Rheumatoid arthritis(714.0)   . Hx of adenomatous colonic polyps     Past Surgical History  Procedure Laterality Date  . Angioplasty  1997, 2008  . Coronary stent placement  2002    coronary  . Hernia repair      Inguinal and ventral  .  Colonoscopy w/ polypectomy  02/20/2011    8 polyps removed, largest 1 cm, mix of adenomas, lymphoid aggregates and hyperplastic    Social History:  reports that he quit smoking about 24 years ago. He quit smokeless tobacco use about 7 years ago. He reports that he does not drink alcohol or use illicit drugs.  No Known Allergies  Family History  Problem Relation Age of Onset  . Stroke Mother   . Stroke Father     Medication Sig  amLODipine 10 MG tablet Take 10 mg by mouth daily.  aspirin EC 81 MG tablet Take 81 mg by mouth daily.  furosemide (LASIX) 20 MG tablet Take 20 mg daily as needed for edema.  glyBURIDE (DIABETA) 5 MG tablet Take 5 mg 2 (two) times daily with a meal.  hydrochlorothiazide ( 25 MG  tablet Take 25 mg by mouth daily.  Insulin Glargine (LANTUS SOLOSTAR) 100 UNIT/ML SOPN Inject 12 Units into the skin at bedtime as needed (for high cbg).  memantine (NAMENDA) 5 MG tablet Take 5 mg by mouth 2 (two) times daily.  metoprolol (LOPRESSOR) 50 MG tablet Take 1 tablet (50 mg total) by mouth 2 (two) times daily.  methotrexate (RHEUMATREX) 2.5 MG tablet Take 12.5 mg by mouth once a week.    Physical Exam: Filed Vitals:   08/08/13 2222 08/08/13 2230 08/08/13 2245 08/08/13 2256  BP: 104/73 110/60 110/57 111/56  Pulse: 79 77 79 80  Resp: 18 20 20 21   SpO2: 100% 100% 100% 100%    Physical Exam  Constitutional: Appears confused but not in acute distress  HENT: Normocephalic. External right and left ear normal. Dry MM Eyes: Conjunctivae and EOM are normal. PERRLA, no scleral icterus.  Neck: Normal ROM. Neck supple. No JVD. No tracheal deviation. No thyromegaly.  CVS: RRR, S1/S2 +, no murmurs, no gallops, no carotid bruit.  Pulmonary: Effort and breath sounds normal, no stridor, diminished breath sounds at bases  Abdominal: Soft. BS +,  no distension, tenderness, rebound or guarding.  Musculoskeletal: Normal range of motion. No edema and no tenderness.  Lymphadenopathy: No lymphadenopathy noted, cervical, inguinal. Neuro: Somnolent but no focal deficits  Skin: Skin is warm and dry. No rash noted. Not diaphoretic. No erythema. No pallor.  Psychiatric: Unable to assess due to mental status change  Labs on Admission:  Basic Metabolic Panel:  Recent Labs Lab 08/08/13 2113 08/08/13 2119  NA 138 141  K 3.6 3.6  CL 100 106  CO2 15*  --   GLUCOSE 327* 276*  BUN 31* 32*  CREATININE 2.09* 2.20*  CALCIUM 8.1*  --    Liver Function Tests:  Recent Labs Lab 08/08/13 2113  AST 93*  ALT 59*  ALKPHOS 72  BILITOT 0.6  PROT 6.6  ALBUMIN 2.8*   CBC:  Recent Labs Lab 08/08/13 2102 08/08/13 2119  WBC 8.3  --   NEUTROABS 4.5  --   HGB 13.2 15.0  HCT 41.6 44.0  MCV 97.9   --   PLT 96*  --    CBG:  Recent Labs Lab 08/08/13 2103  GLUCAP 219*    Radiological Exams on Admission: Ct Head Wo Contrast  08/08/2013   CLINICAL DATA:  Unresponsive  EXAM: CT HEAD WITHOUT CONTRAST  TECHNIQUE: Contiguous axial images were obtained from the base of the skull through the vertex without intravenous contrast.  COMPARISON:  Prior CT from 06/02/2012  FINDINGS: Extensive age-related atrophy and chronic microvascular ischemic disease there is again seen, similar as  compared to the prior examination. Ventricular size is unchanged. Prominent bilateral basal ganglia calcifications are noted. No acute intracranial hemorrhage or large vessel territory infarct. No extra-axial fluid collection. Remote right cerebellar infarct and right parietal infarcts again noted. No mass lesion or midline shift. Calvarium is intact. Orbital soft tissues are within normal limits. The paranasal sinuses and mastoid air cells are clear.  IMPRESSION: 1. No acute intracranial process. 2. Stable appearance of extensive global atrophy and chronic microvascular ischemic disease. 3. Remote right cerebellar and right frontoparietal infarcts.   Electronically Signed   By: Rise Mu M.D.   On: 08/08/2013 22:34   Dg Chest Portable 1 View  08/08/2013   CLINICAL DATA:  Altered mental status. Hypertension. Atrial fibrillation  EXAM: PORTABLE CHEST - 1 VIEW  COMPARISON:  06/02/2012  FINDINGS: Chronic cardiomegaly, stable from prior. Mediastinal contours distorted by rightward rotation. Chronic interstitial coarsening at the bases. No acute infiltrate or edema. No effusion or pneumothorax. Linear high attenuation overlapping the lower right neck is likely either the thyroid cartilage or external to the patient.No history of neck pain.  IMPRESSION: Negative for edema or consolidation.   Electronically Signed   By: Tiburcio Pea M.D.   On: 08/08/2013 21:26    EKG: Normal sinus rhythm, no ST/T wave  changes  Debbora Presto, MD  [facility] Pager [phone]  If 7PM-7AM, please contact night-coverage [email or web address] Password [email or web address] 08/09/2013, 12:14 AM

## 2013-08-09 NOTE — ED Notes (Signed)
No changes, lab at [facility] obtaining [facility], family at [facility] x2, preparing to transport. Alert, NAD, calm.

## 2013-08-09 NOTE — Progress Notes (Signed)
Portable EEG completed

## 2013-08-10 DIAGNOSIS — I359 Nonrheumatic aortic valve disorder, unspecified: Secondary | ICD-10-CM

## 2013-08-10 LAB — COMPREHENSIVE METABOLIC PANEL
ALT: 81 U/L — ABNORMAL HIGH (ref 0–53)
Albumin: 2.9 g/dL — ABNORMAL LOW (ref 3.5–5.2)
BUN: 35 mg/dL — ABNORMAL HIGH (ref 6–23)
CO2: 25 mEq/L (ref 19–32)
Chloride: 105 mEq/L (ref 96–112)
Creatinine, Ser: 1.92 mg/dL — ABNORMAL HIGH (ref 0.50–1.35)
GFR calc Af Amer: 37 mL/min — ABNORMAL LOW (ref >90)
GFR calc non Af Amer: 32 mL/min — ABNORMAL LOW (ref >90)
Total Bilirubin: 0.5 mg/dL (ref 0.3–1.2)

## 2013-08-10 LAB — CBC
HCT: 37.1 % — ABNORMAL LOW (ref 39.0–52.0)
MCHC: 32.9 g/dL (ref 30.0–36.0)
MCV: 92.8 fL (ref 78.0–100.0)
RDW: 15 % (ref 11.5–15.5)
WBC: 6.2 10*3/uL (ref 4.0–10.5)

## 2013-08-10 LAB — GLUCOSE, CAPILLARY
Glucose-Capillary: 99 mg/dL (ref 70–99)
Glucose-Capillary: 174 mg/dL — ABNORMAL HIGH (ref 70–99)
Glucose-Capillary: 210 mg/dL — ABNORMAL HIGH (ref 70–99)

## 2013-08-10 MED ORDER — ENOXAPARIN SODIUM 40 MG/0.4ML ~~LOC~~ SOLN
40.0000 mg | SUBCUTANEOUS | Status: DC
  Administered 2013-08-10 – 2013-08-13 (×2): 40 mg via SUBCUTANEOUS
  Filled 2013-08-10 (×5): qty 0.4

## 2013-08-10 MED ORDER — SODIUM CHLORIDE 0.9 % IV SOLN
INTRAVENOUS | Status: DC
  Administered 2013-08-10: 16:00:00 via INTRAVENOUS

## 2013-08-10 NOTE — Progress Notes (Signed)
[facility] PROGRESS NOTE  Matthew Bean ZOX:[id number] DOB: 1933/08/30 DOA: 08/08/2013 PCP: [name], MD  Brief narrative: Pt is 79 yo male with complicated medical conditions including HTN, HLD, DM, systolic and diastolic CHF, presented to [facility] ED with main concern of sudden onset altered mental status. Pt is unable to provide history due to confusion and family at bedside unable to provide details as they have not witnessed the events. There has been no reported fevers, chills, no chest pain. In ED, pt found to be initially lethargic with lactic acid > 9, with pH 7.2 and pO2 60. Pt was given 500 cc NS and his mental status improved, lactic acid down to 4 and oxygen saturation 100% on 4 L [place].  Work up has revealed elevated Troponin, acute small strokes.  Cardiology following, favors aborted VT/VF and need for cath and likely ICD.  Neurology also following and stroke work up underway.    Principal Problem: VT (ventricular tachycardia) and Syncope - Presumed cause for his syncope was a non sustained episode of VT/VF which may have caused his stroke as well - He is being monitored on telemetry - CE are flat, another set are being drawn per Cardiology - Cardiology following along, consider LHC in AM, will make NPO at [place] - For his AMS, urine cultures and blood cultures are no growth to date, he has been afebrile - TTE done today; EF of 35-40%, mild LVH, grade 1 diastolic dysfunction  Active Problems: Acute on chronic renal failure - Cr stable today, close to baseline - Lasix held - Low rate of fluids started today by Cardiology  CVA (cerebral infarction) - Neurology consulted - Per Neurology, consider starting statin and consider MRA - ASA 81mg  - Will defer MRA in setting of likely LHC tomorrow by Cardiology.  Consider after cardiac work up.  Transaminitis - Improving today, likely due to acute illness  DM (diabetes mellitus), type 2, uncontrolled with complications - A1C  6.8 - On home lantus and ssi - CBGs have ranged from 90s to low 200s  HYPERTENSION - BP slightly higher today, ACE-I on hold due to renal function - Could consider restarting norvasc tomorrow if BP continues to be high - Lasix on hold - On metoprolol  CONGESTIVE HEART FAILURE - Repeat TTE above, appears improved - I/Os, low rate fluids - Cardiology following - Holding lasix  Lactic acidosis - Resolved  Consultants:  Cardiology  Neurology  Procedures/Studies: Ct Head Wo Contrast  08/08/2013   CLINICAL DATA:  Unresponsive  EXAM: CT HEAD WITHOUT CONTRAST  TECHNIQUE: Contiguous axial images were obtained from the base of the skull through the vertex without intravenous contrast.  COMPARISON:  Prior CT from 06/02/2012  FINDINGS: Extensive age-related atrophy and chronic microvascular ischemic disease there is again seen, similar as compared to the prior examination. Ventricular size is unchanged. Prominent bilateral basal ganglia calcifications are noted. No acute intracranial hemorrhage or large vessel territory infarct. No extra-axial fluid collection. Remote right cerebellar infarct and right parietal infarcts again noted. No mass lesion or midline shift. Calvarium is intact. Orbital soft tissues are within normal limits. The paranasal sinuses and mastoid air cells are clear.  IMPRESSION: 1. No acute intracranial process. 2. Stable appearance of extensive global atrophy and chronic microvascular ischemic disease. 3. Remote right cerebellar and right frontoparietal infarcts.   Electronically Signed   By: Rise Mu M.D.   On: 08/08/2013 22:34   Mr Brain Wo Contrast  08/09/2013   CLINICAL DATA:  Sudden onset of altered mental status. Stroke risk factors include hypertension, hyperlipidemia, diabetes, and renal failure.  EXAM: MRI HEAD WITHOUT CONTRAST  TECHNIQUE: Multiplanar, multisequence MR imaging was performed. No intravenous contrast was administered.  COMPARISON:  CT head  08/08/2013.  FINDINGS: Acute subcentimeter right frontal cortical infarction (image 26 series 3). Acute left posterior frontal subcentimeter cortical infarct (image 29 series 3). Probable acute right posterior temporal subcentimeter infarct (image 12 series 3), persists on thin DWI series. Attention to this area on followup MR as clinically indicated.  Severe cerebral and cerebellar atrophy. Remote bilateral cortical infarcts with brain substance loss, most notable right parietal region. Remote bilateral right greater than left cerebellar infarcts. Extensive chronic microvascular ischemic change throughout the periventricular and subcortical white matter.  Small foci of chronic hemorrhage are scattered throughout both cerebral hemispheres likely sequelae of hypertensive cerebrovascular disease. No midline shift. Flow voids are maintained. No osseous lesions. No acute mastoid disease. Small amount of left sphenoid sinus fluid could be acute.  IMPRESSION: At least two and probably three acute subcentimeter infarcts as described. Suspect shower of emboli.  Advanced atrophy and chronic microvascular ischemic change with numerous remote infarcts and microbleeds   Electronically Signed   By: Davonna Belling M.D.   On: 08/09/2013 12:08   Dg Chest Portable 1 View  08/08/2013   CLINICAL DATA:  Altered mental status. Hypertension. Atrial fibrillation  EXAM: PORTABLE CHEST - 1 VIEW  COMPARISON:  06/02/2012  FINDINGS: Chronic cardiomegaly, stable from prior. Mediastinal contours distorted by rightward rotation. Chronic interstitial coarsening at the bases. No acute infiltrate or edema. No effusion or pneumothorax. Linear high attenuation overlapping the lower right neck is likely either the thyroid cartilage or external to the patient.No history of neck pain.  IMPRESSION: Negative for edema or consolidation.   Electronically Signed   By: Tiburcio Pea M.D.   On: 08/08/2013 21:26     Antibiotics:  None  Code:  Full  HPI/Subjective: Did well overnight, no acute events.  No complaints this AM.   Objective: Filed Vitals:   08/09/13 1900 08/09/13 2316 08/10/13 0336 08/10/13 0804  BP: 126/58 132/63 131/59 142/68  Pulse: 69 74 69 71  Temp: 98.2 F (36.8 C) 98 F (36.7 C) 98.4 F (36.9 C) 98.2 F (36.8 C)  TempSrc: Oral Oral Oral Oral  Resp: 26  24 20   Height:      Weight:   211 lb 13.8 oz (96.1 kg)   SpO2: 100% 100% 96% 96%    Intake/Output Summary (Last 24 hours) at 08/10/13 0936 Last data filed at 08/10/13 0805  Gross per 24 hour  Intake    840 ml  Output   1325 ml  Net   -485 ml    Exam:   General:  Pt is alert and oriented to person, place, time, follows commands appropriately, not in acute distress  Cardiovascular: Regular rate and rhythm (SR by telemetry), distant heart sounds, 2-3/6 systolic murmur  Respiratory: Clear to auscultation bilaterally, no wheezing  Abdomen: Soft, non tender, bowel sounds present  Extremities: No edema, warm and dry  Neuro: Grossly nonfocal  Data Reviewed: Basic Metabolic Panel:  Recent Labs Lab 08/08/13 2113 08/08/13 2119 08/09/13 0200 08/09/13 0612 08/10/13 0455  NA 138 141  --  137 139  K 3.6 3.6  --  4.0 3.7  CL 100 106  --  104 105  CO2 15*  --   --  21 25  GLUCOSE 327* 276*  --  144* 121*  BUN 31* 32*  --  34* 35*  CREATININE 2.09* 2.20*  --  1.99* 1.92*  CALCIUM 8.1*  --   --  8.0* 8.5  MG  --   --  2.2  --   --   PHOS  --   --  4.4  --   --    Liver Function Tests:  Recent Labs Lab 08/08/13 2113 08/09/13 0612 08/10/13 0455  AST 93* 153* 50*  ALT 59* 132* 81*  ALKPHOS 72 77 72  BILITOT 0.6 0.5 0.5  PROT 6.6 6.3 6.8  ALBUMIN 2.8* 2.7* 2.9*   CBC:  Recent Labs Lab 08/08/13 2102 08/08/13 2119 08/09/13 0612 08/10/13 0455  WBC 8.3  --  5.6 6.2  NEUTROABS 4.5  --   --   --   HGB 13.2 15.0 12.3* 12.2*  HCT 41.6 44.0 37.3* 37.1*  MCV 97.9  --  92.1 92.8  PLT 96*  --  87* 98*   Cardiac  Enzymes:  Recent Labs Lab 08/09/13 0220 08/09/13 0815 08/09/13 1015 08/09/13 1705  TROPONINI 0.90* 2.32* 2.66* 2.35*   BNP: No components found with this basename: POCBNP,  CBG:  Recent Labs Lab 08/09/13 0842 08/09/13 1320 08/09/13 1703 08/09/13 2208 08/10/13 0802  GLUCAP 90 109* 137* 203* 99    Recent Results (from the past 240 hour(s))  MRSA PCR SCREENING     Status: None   Collection Time    08/09/13  1:06 AM      Result Value Range Status   MRSA by PCR NEGATIVE  NEGATIVE Final   Comment:            The GeneXpert MRSA Assay (FDA     approved for NASAL specimens     only), is one component of a     comprehensive MRSA colonization     surveillance program. It is not     intended to diagnose MRSA     infection nor to guide or     monitor treatment for     MRSA infections.     Scheduled Meds: . aspirin EC  81 mg Oral Daily  . carvedilol  6.25 mg Oral BID WC  . enoxaparin (LOVENOX) injection  40 mg Subcutaneous Q24H  . furosemide  40 mg Oral Daily  . insulin aspart  0-9 Units Subcutaneous TID WC  . insulin glargine  12 Units Subcutaneous QHS  . pneumococcal 23 valent vaccine  0.5 mL Intramuscular Tomorrow-1000  . potassium chloride  20 mEq Oral Daily   Continuous Infusions:    Debe Coder, MD  [facility] Pager [phone]  If 7PM-7AM, please contact night-coverage [email or web address] Password [facility] 08/10/2013, 9:36 AM   LOS: 2 days

## 2013-08-10 NOTE — Progress Notes (Signed)
Stroke Team Progress Note  HISTORY Matthew Bean is a 79 y.o. male with. diabetes mellitus, hypertension, hyperlipidemia, coronary artery disease and congestive heart failure, who was brought to the hospital following an episode of loss of consciousness at home thought  Likely to have been syncopal in nature with possible transient ventricular tachycardia or fibrillation. Lactic acid was elevated on admission but subsequently improved. He was found to be in atrial fibrillation on presentation in the emergency room with no apparent previous documentation of atrial fibrillation. His been on aspirin for antiplatelet therapy. Patient was felt to have suffered a NSTEMI. MRI of his brain was obtained as part of his workup altered mental status. Study showed small likely embolic strokes involving the right posterior frontal and right posterior temporal regions. No focal deficits were described. NIH stroke score is 0. He was last known well at 5:30 PM on 08/08/2013.   LSN: 5:30 PM on 08/08/2013  tPA Given: No: No objective deficits; beyond time window for treatment consideration.  MRankin: 0   SUBJECTIVE There no family members present. The patient has no specific complaints.  OBJECTIVE Most recent Vital Signs: Filed Vitals:   08/09/13 1900 08/09/13 2316 08/10/13 0336 08/10/13 0804  BP: 126/58 132/63 131/59 142/68  Pulse: 69 74 69 71  Temp: 98.2 F (36.8 C) 98 F (36.7 C) 98.4 F (36.9 C) 98.2 F (36.8 C)  TempSrc: Oral Oral Oral Oral  Resp: 26  24 20   Height:      Weight:   96.1 kg (211 lb 13.8 oz)   SpO2: 100% 100% 96% 96%   CBG (last 3)   Recent Labs  08/09/13 1703 08/09/13 2208 08/10/13 0802  GLUCAP 137* 203* 99    IV Fluid Intake:     MEDICATIONS  . aspirin EC  81 mg Oral Daily  . carvedilol  6.25 mg Oral BID WC  . enoxaparin (LOVENOX) injection  40 mg Subcutaneous Q24H  . furosemide  40 mg Oral Daily  . insulin aspart  0-9 Units Subcutaneous TID WC  . insulin glargine  12  Units Subcutaneous QHS  . pneumococcal 23 valent vaccine  0.5 mL Intramuscular Tomorrow-1000  . potassium chloride  20 mEq Oral Daily   PRN:  HYDROcodone-acetaminophen, ondansetron (ZOFRAN) IV, ondansetron  Diet:  General thin liquids Activity:  Bedrest with Bathroom privileges DVT Prophylaxis:  Lovenox  CLINICALLY SIGNIFICANT STUDIES Basic Metabolic Panel:   Recent Labs Lab 08/09/13 0200 08/09/13 0612 08/10/13 0455  NA  --  137 139  K  --  4.0 3.7  CL  --  104 105  CO2  --  21 25  GLUCOSE  --  144* 121*  BUN  --  34* 35*  CREATININE  --  1.99* 1.92*  CALCIUM  --  8.0* 8.5  MG 2.2  --   --   PHOS 4.4  --   --    Liver Function Tests:   Recent Labs Lab 08/09/13 0612 08/10/13 0455  AST 153* 50*  ALT 132* 81*  ALKPHOS 77 72  BILITOT 0.5 0.5  PROT 6.3 6.8  ALBUMIN 2.7* 2.9*   CBC:  Recent Labs Lab 08/08/13 2102  08/09/13 0612 08/10/13 0455  WBC 8.3  --  5.6 6.2  NEUTROABS 4.5  --   --   --   HGB 13.2  < > 12.3* 12.2*  HCT 41.6  < > 37.3* 37.1*  MCV 97.9  --  92.1 92.8  PLT 96*  --  87* 98*  < > = values in this interval not displayed. Coagulation:   Recent Labs Lab 08/09/13 1015  LABPROT 15.1  INR 1.22   Cardiac Enzymes:   Recent Labs Lab 08/09/13 0815 08/09/13 1015 08/09/13 1705  TROPONINI 2.32* 2.66* 2.35*   Urinalysis:   Recent Labs Lab 08/09/13 0112  COLORURINE YELLOW  LABSPEC 1.019  PHURINE 5.0  GLUCOSEU >1000*  HGBUR LARGE*  BILIRUBINUR NEGATIVE  KETONESUR NEGATIVE  PROTEINUR 100*  UROBILINOGEN 1.0  NITRITE NEGATIVE  LEUKOCYTESUR NEGATIVE   Lipid Panel    Component Value Date/Time   CHOL 169 08/09/2013 0200   TRIG 130 08/09/2013 0200   HDL 37* 08/09/2013 0200   CHOLHDL 4.6 08/09/2013 0200   VLDL 26 08/09/2013 0200   LDLCALC 106* 08/09/2013 0200   HgbA1C  Lab Results  Component Value Date   HGBA1C 6.8* 08/09/2013    Urine Drug Screen:     Component Value Date/Time   LABOPIA NONE DETECTED 08/09/2013 0112   COCAINSCRNUR  NONE DETECTED 08/09/2013 0112   LABBENZ NONE DETECTED 08/09/2013 0112   AMPHETMU NONE DETECTED 08/09/2013 0112   THCU NONE DETECTED 08/09/2013 0112   LABBARB NONE DETECTED 08/09/2013 0112    Alcohol Level:   Recent Labs Lab 08/09/13 0220  ETH <11    Ct Head Wo Contrast 08/08/2013    1. No acute intracranial process. 2. Stable appearance of extensive global atrophy and chronic microvascular ischemic disease. 3. Remote right cerebellar and right frontoparietal infarcts.     Mr Brain Wo Contrast 08/09/2013    At least two and probably three acute subcentimeter infarcts as described. Suspect shower of emboli.  Advanced atrophy and chronic microvascular ischemic change with numerous remote infarcts and microbleeds       Dg Chest Portable 1 View 08/08/2013    Negative for edema or consolidation.      MRA of the brain    2D Echocardiogram  - ejection fraction 25-30%. No obvious cardiac source of emboli identified.  Carotid Doppler  Preliminary findings: Bilateral: 1-39% ICA stenosis. Vertebral artery flow is antegrade. Right vertebral demonstrates atypical flow with loss of diastolic component. This could suggest distal obstruction.   EKG  -  sinus rhythm rate 70 beats per minute  Therapy Recommendations  - pending  Physical Exam   Neurologic Examination:  Mental Status:  Alert, oriented, thought content appropriate. Speech fluent without evidence of aphasia. Able to follow commands without difficulty.  Cranial Nerves:  II-Visual fields were normal.  III/IV/VI-Pupils were equal and reacted. Extraocular movements were full and conjugate.  V/VII-no facial numbness and no facial weakness.  VIII-normal.  X-normal speech and symmetrical palatal movement.  Motor: 5/5 bilaterally with normal tone and bulk  Sensory: Normal throughout.  Deep Tendon Reflexes: 1+ and symmetric in upper extremities; absent at knees and ankles.  Plantars: Mute bilaterally  Cerebellar: Normal finger-to-nose  testing.  Carotid auscultation: Normal    ASSESSMENT Matthew Bean is a 79 y.o. male presenting with loss of consciousness. TPA was not given - NIH score was 0. MRI revealed small strokes involving the right posterior frontal and right posterior temporal regions. Infarcts felt to be embolic secondary to Atrial fibrillation. On aspirin 81 mg orally every day prior to admission. Now on aspirin 81 mg orally every day for secondary stroke prevention. Patient with resultant minimal deficits. Work up underway.   EF 25-30%  Afib  Diabetes mellitus- hemoglobin A1c 6.8  Hyperlipidemia - cholesterol 169 LDL 106 -  would benefit from statin therapy secondary to elevated LDL.  Hypertension - currently controlled. No need to be overly aggressive in setting of recent stroke.  Carotid Dopplers - See report as above. Consider further evaluation of vertebral arteries by MRA.  Hospital day # 2  TREATMENT/PLAN   aspirin 81 mg orally every day for secondary stroke prevention. In light of atrial fibrillation for anticoagulation in 3-5 days should be considered based on safety concerns.  Await therapy evaluations  Possible further followup of mildly abnormal carotid Dopplers. Consider MRA.  Risk factor modification  Followup Dr. Pearlean Brownie in 6-8 weeks.  Delton See PA-C [facility] Pager [phone] 08/10/2013, 9:52 AM  I have personally obtained a history, examined the patient, evaluated imaging results, and formulated the assessment and plan of care. I agree with the above.

## 2013-08-10 NOTE — Progress Notes (Signed)
Pharmacist Heart Failure Core Measure Documentation  Assessment: Matthew Bean has an EF documented as 30 on Dec 2013 by ECHO.  Rationale: Heart failure patients with left ventricular systolic dysfunction (LVSD) and an EF < 40% should be prescribed an angiotensin converting enzyme inhibitor (ACEI) or angiotensin receptor blocker (ARB) at discharge unless a contraindication is documented in the medical record.  This patient is not currently on an ACEI or ARB for HF.  This note is being placed in the record in order to provide documentation that a contraindication to the use of these agents is present for this encounter.  ACE Inhibitor or Angiotensin Receptor Blocker is contraindicated (specify all that apply)  []   ACEI allergy AND ARB allergy []   Angioedema []   Moderate or severe aortic stenosis []   Hyperkalemia []   Hypotension []   Renal artery stenosis [x]   Worsening renal function, preexisting renal disease or dysfunction   Corinn Stoltzfus Merrily Brittle 08/10/2013 7:34 AM

## 2013-08-10 NOTE — Progress Notes (Addendum)
Patient ID: AMARE BAIL, male   DOB: 1932/10/30, 79 y.o.   MRN: [id number]    SUBJECTIVE:  Patient is stable. Some family members are in the room. He is stable and flat in bed.  So far the patient's troponins are flat. I cannot be sure if this is because they peaked or if he has chronic elevation. I will repeat more troponins.   Filed Vitals:   08/10/13 0900 08/10/13 1000 08/10/13 1100 08/10/13 1200  BP: 90/70 120/62 161/69 129/55  Pulse:      Temp:      TempSrc:      Resp: 15 26 25 23   Height:      Weight:      SpO2:         Intake/Output Summary (Last 24 hours) at 08/10/13 1249 Last data filed at 08/10/13 0805  Gross per 24 hour  Intake    240 ml  Output   1125 ml  Net   -885 ml    LABS: Basic Metabolic Panel:  Recent Labs  40/98/11 0200 08/09/13 0612 08/10/13 0455  NA  --  137 139  K  --  4.0 3.7  CL  --  104 105  CO2  --  21 25  GLUCOSE  --  144* 121*  BUN  --  34* 35*  CREATININE  --  1.99* 1.92*  CALCIUM  --  8.0* 8.5  MG 2.2  --   --   PHOS 4.4  --   --    Liver Function Tests:  Recent Labs  08/09/13 0612 08/10/13 0455  AST 153* 50*  ALT 132* 81*  ALKPHOS 77 72  BILITOT 0.5 0.5  PROT 6.3 6.8  ALBUMIN 2.7* 2.9*   No results found for this basename: LIPASE, AMYLASE,  in the last 72 hours CBC:  Recent Labs  08/08/13 2102  08/09/13 0612 08/10/13 0455  WBC 8.3  --  5.6 6.2  NEUTROABS 4.5  --   --   --   HGB 13.2  < > 12.3* 12.2*  HCT 41.6  < > 37.3* 37.1*  MCV 97.9  --  92.1 92.8  PLT 96*  --  87* 98*  < > = values in this interval not displayed. Cardiac Enzymes:  Recent Labs  08/09/13 0815 08/09/13 1015 08/09/13 1705  TROPONINI 2.32* 2.66* 2.35*   BNP: No components found with this basename: POCBNP,  D-Dimer: No results found for this basename: DDIMER,  in the last 72 hours Hemoglobin A1C:  Recent Labs  08/09/13 0200  HGBA1C 6.8*   Fasting Lipid Panel:  Recent Labs  08/09/13 0200  CHOL 169  HDL 37*  LDLCALC 106*    TRIG 130  CHOLHDL 4.6   Thyroid Function Tests:  Recent Labs  08/09/13 0200  TSH 1.606    RADIOLOGY: Ct Head Wo Contrast  08/08/2013   CLINICAL DATA:  Unresponsive  EXAM: CT HEAD WITHOUT CONTRAST  TECHNIQUE: Contiguous axial images were obtained from the base of the skull through the vertex without intravenous contrast.  COMPARISON:  Prior CT from 06/02/2012  FINDINGS: Extensive age-related atrophy and chronic microvascular ischemic disease there is again seen, similar as compared to the prior examination. Ventricular size is unchanged. Prominent bilateral basal ganglia calcifications are noted. No acute intracranial hemorrhage or large vessel territory infarct. No extra-axial fluid collection. Remote right cerebellar infarct and right parietal infarcts again noted. No mass lesion or midline shift. Calvarium is intact. Orbital soft tissues are  within normal limits. The paranasal sinuses and mastoid air cells are clear.  IMPRESSION: 1. No acute intracranial process. 2. Stable appearance of extensive global atrophy and chronic microvascular ischemic disease. 3. Remote right cerebellar and right frontoparietal infarcts.   Electronically Signed   By: Rise Mu M.D.   On: 08/08/2013 22:34   Mr Brain Wo Contrast  08/09/2013   CLINICAL DATA:  Sudden onset of altered mental status. Stroke risk factors include hypertension, hyperlipidemia, diabetes, and renal failure.  EXAM: MRI HEAD WITHOUT CONTRAST  TECHNIQUE: Multiplanar, multisequence MR imaging was performed. No intravenous contrast was administered.  COMPARISON:  CT head 08/08/2013.  FINDINGS: Acute subcentimeter right frontal cortical infarction (image 26 series 3). Acute left posterior frontal subcentimeter cortical infarct (image 29 series 3). Probable acute right posterior temporal subcentimeter infarct (image 12 series 3), persists on thin DWI series. Attention to this area on followup MR as clinically indicated.  Severe cerebral and  cerebellar atrophy. Remote bilateral cortical infarcts with brain substance loss, most notable right parietal region. Remote bilateral right greater than left cerebellar infarcts. Extensive chronic microvascular ischemic change throughout the periventricular and subcortical white matter.  Small foci of chronic hemorrhage are scattered throughout both cerebral hemispheres likely sequelae of hypertensive cerebrovascular disease. No midline shift. Flow voids are maintained. No osseous lesions. No acute mastoid disease. Small amount of left sphenoid sinus fluid could be acute.  IMPRESSION: At least two and probably three acute subcentimeter infarcts as described. Suspect shower of emboli.  Advanced atrophy and chronic microvascular ischemic change with numerous remote infarcts and microbleeds   Electronically Signed   By: Davonna Belling M.D.   On: 08/09/2013 12:08   Dg Chest Portable 1 View  08/08/2013   CLINICAL DATA:  Altered mental status. Hypertension. Atrial fibrillation  EXAM: PORTABLE CHEST - 1 VIEW  COMPARISON:  06/02/2012  FINDINGS: Chronic cardiomegaly, stable from prior. Mediastinal contours distorted by rightward rotation. Chronic interstitial coarsening at the bases. No acute infiltrate or edema. No effusion or pneumothorax. Linear high attenuation overlapping the lower right neck is likely either the thyroid cartilage or external to the patient.No history of neck pain.  IMPRESSION: Negative for edema or consolidation.   Electronically Signed   By: Tiburcio Pea M.D.   On: 08/08/2013 21:26    PHYSICAL EXAM patient is oriented. Lungs reveal scattered rhonchi. Cardiac exam reveals S1 and S2. The abdomen is soft. There's no peripheral edema.  TELEMETRY: The patient had 14 beats of ventricular tachycardia yesterday afternoon. This was mentioned in the consult done yesterday. I do not see any further ventricular tachycardia since that time.   ASSESSMENT AND PLAN:    VT (ventricular tachycardia)      The patient's rhythm is stable today. There is concern that his presenting event may have been ventricular tachycardia or ventricular fibrillation. Consideration is being given to proceeding with cardiac catheterization. His renal function is an issue. The plan for today will be to continue to monitor his rhythm. Two-dimensional echo was done. Ejection fraction is in the 35% range. There are significant focal wall motion abnormalities affecting the inferior wall, inferolateral wall, and inferior septum.   Active Problems:   DM (diabetes mellitus), type 2, uncontrolled with complications   HYPERLIPIDEMIA   HYPERTENSION   CONGESTIVE HEART FAILURE   Altered mental state    Acute on chronic renal failure     Creatinine in the recent past before admission was in the range of 1.7. I put his diuretic on  hold at 1:00 today. He may have already received a dose this morning.    Acute respiratory failure with hypoxia   Lactic acidosis   Transaminitis   Syncope   CVA (cerebral infarction)  The plan will be to reassess the patient's mental status and renal function tomorrow. If all consulting physicians feel he is stable, we may proceed with cardiac catheterization tomorrow.  Willa Rough 08/10/2013 12:49 PM

## 2013-08-10 NOTE — Progress Notes (Signed)
  Echocardiogram 2D Echocardiogram has been performed.  Ger Ringenberg FRANCES 08/10/2013, 11:12 AM

## 2013-08-10 NOTE — Progress Notes (Signed)
Pt T101.2.Marland Kitchenpaged oncall PA [place]. Awaiting call back. Barbera Setters

## 2013-08-10 NOTE — Progress Notes (Signed)
*  PRELIMINARY RESULTS* Vascular Ultrasound Carotid Duplex (Doppler) has been completed.  Preliminary findings: Bilateral:  1-39% ICA stenosis.  Vertebral artery flow is antegrade.  Right vertebral demonstrates atypical flow with loss of diastolic component. This could suggest distal obstruction.     Farrel Demark, RDMS, RVT  08/10/2013, 10:13 AM

## 2013-08-11 ENCOUNTER — Inpatient Hospital Stay (HOSPITAL_COMMUNITY): Payer: Medicare Other

## 2013-08-11 DIAGNOSIS — I509 Heart failure, unspecified: Secondary | ICD-10-CM

## 2013-08-11 DIAGNOSIS — I2589 Other forms of chronic ischemic heart disease: Secondary | ICD-10-CM

## 2013-08-11 DIAGNOSIS — I2510 Atherosclerotic heart disease of native coronary artery without angina pectoris: Secondary | ICD-10-CM

## 2013-08-11 DIAGNOSIS — I4891 Unspecified atrial fibrillation: Secondary | ICD-10-CM

## 2013-08-11 LAB — GLUCOSE, CAPILLARY: Glucose-Capillary: 54 mg/dL — ABNORMAL LOW (ref 70–99)

## 2013-08-11 LAB — CBC
HCT: 37.4 % — ABNORMAL LOW (ref 39.0–52.0)
Hemoglobin: 12.3 g/dL — ABNORMAL LOW (ref 13.0–17.0)
MCHC: 32.9 g/dL (ref 30.0–36.0)
RBC: 4.1 MIL/uL — ABNORMAL LOW (ref 4.22–5.81)
RDW: 14.7 % (ref 11.5–15.5)
WBC: 5.1 10*3/uL (ref 4.0–10.5)

## 2013-08-11 LAB — BASIC METABOLIC PANEL
BUN: 29 mg/dL — ABNORMAL HIGH (ref 6–23)
CO2: 25 mEq/L (ref 19–32)
Chloride: 103 mEq/L (ref 96–112)
Creatinine, Ser: 1.67 mg/dL — ABNORMAL HIGH (ref 0.50–1.35)
GFR calc non Af Amer: 38 mL/min — ABNORMAL LOW (ref >90)
Glucose, Bld: 151 mg/dL — ABNORMAL HIGH (ref 70–99)
Potassium: 3.6 mEq/L (ref 3.5–5.1)

## 2013-08-11 LAB — PROCALCITONIN: Procalcitonin: 10.15 ng/mL

## 2013-08-11 LAB — URINE CULTURE: Colony Count: 5000

## 2013-08-11 MED ORDER — LEVOFLOXACIN IN D5W 750 MG/150ML IV SOLN
750.0000 mg | INTRAVENOUS | Status: DC
  Administered 2013-08-11: 750 mg via INTRAVENOUS
  Filled 2013-08-11 (×3): qty 150

## 2013-08-11 MED ORDER — SODIUM CHLORIDE 0.9 % IV SOLN: 250.0000 mL | INTRAVENOUS | Status: DC | PRN

## 2013-08-11 MED ORDER — DEXTROSE 50 % IV SOLN
25.0000 mL | Freq: Once | INTRAVENOUS | Status: AC | PRN
  Administered 2013-08-11: 13:00:00 via INTRAVENOUS

## 2013-08-11 MED ORDER — SODIUM CHLORIDE 0.9 % IJ SOLN
3.0000 mL | Freq: Two times a day (BID) | INTRAMUSCULAR | Status: DC
  Administered 2013-08-11: 3 mL via INTRAVENOUS
  Administered 2013-08-11: 23:00:00 via INTRAVENOUS

## 2013-08-11 MED ORDER — DEXTROSE 50 % IV SOLN
25.0000 mL | Freq: Once | INTRAVENOUS | Status: AC | PRN
  Administered 2013-08-11: 25 mL via INTRAVENOUS

## 2013-08-11 MED ORDER — DEXTROSE-NACL 5-0.9 % IV SOLN
INTRAVENOUS | Status: DC
  Administered 2013-08-11: 50 mL/h via INTRAVENOUS

## 2013-08-11 MED ORDER — DEXTROSE 50 % IV SOLN
INTRAVENOUS | Status: AC
  Filled 2013-08-11: qty 50

## 2013-08-11 MED ORDER — SODIUM CHLORIDE 0.9 % IJ SOLN: 3.0000 mL | INTRAMUSCULAR | Status: DC | PRN

## 2013-08-11 MED ORDER — SODIUM CHLORIDE 0.9 % IV SOLN
INTRAVENOUS | Status: DC
  Administered 2013-08-11: 13:00:00 via INTRAVENOUS

## 2013-08-11 NOTE — Progress Notes (Signed)
Stroke Team Progress Note  HISTORY Matthew Bean is a 79 y.o. male with. diabetes mellitus, hypertension, hyperlipidemia, coronary artery disease and congestive heart failure, who was brought to the hospital following an episode of loss of consciousness at home thought  Likely to have been syncopal in nature with possible transient ventricular tachycardia or fibrillation. Lactic acid was elevated on admission but subsequently improved. He was found to be in atrial fibrillation on presentation in the emergency room with no apparent previous documentation of atrial fibrillation. His been on aspirin for antiplatelet therapy. Patient was felt to have suffered a NSTEMI. MRI of his brain was obtained as part of his workup altered mental status. Study showed small likely embolic strokes involving the right posterior frontal and right posterior temporal regions. No focal deficits were described. NIH stroke score is 0. He was last known well at 5:30 PM on 08/08/2013.   LSN: 5:30 PM on 08/08/2013  tPA Given: No: No objective deficits; beyond time window for treatment consideration.  MRankin: 0   SUBJECTIVE The patient is lying in bed. No new symptoms. Family in room.    OBJECTIVE Most recent Vital Signs: Filed Vitals:   08/10/13 2000 08/11/13 0020 08/11/13 0411 08/11/13 0735  BP: 150/77 135/61 135/65 143/67  Pulse: 88 75 65 72  Temp: 99.5 F (37.5 C) 99.7 F (37.6 C) 99 F (37.2 C)   TempSrc: Oral Oral Oral Oral  Resp: 27 27 23 20   Height:      Weight:   96.5 kg (212 lb 11.9 oz)   SpO2: 93% 95% 98% 96%   CBG (last 3)   Recent Labs  08/10/13 1244 08/10/13 1727 08/10/13 2118  GLUCAP 174* 142* 210*    IV Fluid Intake:   . sodium chloride 50 mL/hr at 08/10/13 1601    MEDICATIONS  . aspirin EC  81 mg Oral Daily  . carvedilol  6.25 mg Oral BID WC  . enoxaparin (LOVENOX) injection  40 mg Subcutaneous Q24H  . insulin aspart  0-9 Units Subcutaneous TID WC  . insulin glargine  12 Units  Subcutaneous QHS  . potassium chloride  20 mEq Oral Daily   PRN:  HYDROcodone-acetaminophen, ondansetron (ZOFRAN) IV, ondansetron  Diet:  Advance as tolerated Activity:  Bedrest with Bathroom privileges DVT Prophylaxis:  Lovenox  CLINICALLY SIGNIFICANT STUDIES Basic Metabolic Panel:   Recent Labs Lab 08/09/13 0200  08/10/13 0455 08/11/13 0030  NA  --   < > 139 136  K  --   < > 3.7 3.6  CL  --   < > 105 103  CO2  --   < > 25 25  GLUCOSE  --   < > 121* 151*  BUN  --   < > 35* 29*  CREATININE  --   < > 1.92* 1.67*  CALCIUM  --   < > 8.5 8.5  MG 2.2  --   --   --   PHOS 4.4  --   --   --   < > = values in this interval not displayed. Liver Function Tests:   Recent Labs Lab 08/09/13 0612 08/10/13 0455  AST 153* 50*  ALT 132* 81*  ALKPHOS 77 72  BILITOT 0.5 0.5  PROT 6.3 6.8  ALBUMIN 2.7* 2.9*   CBC:  Recent Labs Lab 08/08/13 2102  08/10/13 0455 08/11/13 0030  WBC 8.3  < > 6.2 5.1  NEUTROABS 4.5  --   --   --  HGB 13.2  < > 12.2* 12.3*  HCT 41.6  < > 37.1* 37.4*  MCV 97.9  < > 92.8 91.2  PLT 96*  < > 98* 107*  < > = values in this interval not displayed. Coagulation:   Recent Labs Lab 08/09/13 1015  LABPROT 15.1  INR 1.22   Cardiac Enzymes:   Recent Labs Lab 08/10/13 1430 08/10/13 1902 08/11/13 0030  TROPONINI 1.47* 1.10* 1.17*   Urinalysis:   Recent Labs Lab 08/09/13 0112  COLORURINE YELLOW  LABSPEC 1.019  PHURINE 5.0  GLUCOSEU >1000*  HGBUR LARGE*  BILIRUBINUR NEGATIVE  KETONESUR NEGATIVE  PROTEINUR 100*  UROBILINOGEN 1.0  NITRITE NEGATIVE  LEUKOCYTESUR NEGATIVE   Lipid Panel    Component Value Date/Time   CHOL 169 08/09/2013 0200   TRIG 130 08/09/2013 0200   HDL 37* 08/09/2013 0200   CHOLHDL 4.6 08/09/2013 0200   VLDL 26 08/09/2013 0200   LDLCALC 106* 08/09/2013 0200   HgbA1C  Lab Results  Component Value Date   HGBA1C 6.8* 08/09/2013    Urine Drug Screen:     Component Value Date/Time   LABOPIA NONE DETECTED 08/09/2013  0112   COCAINSCRNUR NONE DETECTED 08/09/2013 0112   LABBENZ NONE DETECTED 08/09/2013 0112   AMPHETMU NONE DETECTED 08/09/2013 0112   THCU NONE DETECTED 08/09/2013 0112   LABBARB NONE DETECTED 08/09/2013 0112    Alcohol Level:   Recent Labs Lab 08/09/13 0220  ETH <11    Ct Head Wo Contrast 08/08/2013    1. No acute intracranial process. 2. Stable appearance of extensive global atrophy and chronic microvascular ischemic disease. 3. Remote right cerebellar and right frontoparietal infarcts.     Mr Brain Wo Contrast 08/09/2013  At least two and probably three acute subcentimeter infarcts as described. Suspect shower of emboli.  Advanced atrophy and chronic microvascular ischemic change with numerous remote infarcts and microbleeds       Dg Chest Portable 1 View 08/08/2013   Negative for edema or consolidation.      MRA of the brain    2D Echocardiogram  - ejection fraction 25-30%. No obvious cardiac source of emboli identified. EF unchanged from 2013  Carotid Doppler  Preliminary findings: Bilateral: 1-39% ICA stenosis. Vertebral artery flow is antegrade. Right vertebral demonstrates atypical flow with loss of diastolic component. This could suggest distal obstruction.   EKG  -  sinus rhythm rate 70 beats per minute  Therapy Recommendations [facility]  Physical Exam   Neurologic Examination:  Mental Status:  Alert, oriented, thought content appropriate. Speech fluent without evidence of aphasia. Able to follow commands without difficulty.  Cranial Nerves:  II-Visual fields were normal.  III/IV/VI-Pupils were equal and reacted. Extraocular movements were full and conjugate.  V/VII-no facial numbness and no facial weakness.  VIII-normal.  X-normal speech and symmetrical palatal movement.  Motor: 5/5 bilaterally with normal tone and bulk  Sensory: Normal throughout.  Deep Tendon Reflexes: 1+ and symmetric in upper extremities; absent at knees and ankles.  Plantars: Mute  bilaterally  Cerebellar: Normal finger-to-nose testing.  Carotid auscultation: Normal    ASSESSMENT [name] is a 79 y.o. male presenting with loss of consciousness. TPA was not given - NIH score was 0. MRI revealed small strokes involving the right posterior frontal and right posterior temporal regions. Infarcts felt to be embolic secondary to Atrial fibrillation. On aspirin 81 mg orally every day prior to admission. Now on aspirin 81 mg orally every day for secondary stroke  prevention. Patient with resultant minimal deficits. Work up underway.   EF 25-30%  Atrial fibrillation  Diabetes mellitus- hemoglobin A1c 6.8  Hyperlipidemia - cholesterol 169 LDL 106 - would benefit from statin therapy secondary to elevated LDL; however the patient has elevated LFTs and statins cannot be started due to this.  Hypertension - currently controlled. No need to be overly aggressive in setting of recent stroke.  Hospital day # 3  TREATMENT/PLAN   aspirin 81 mg orally every day for secondary stroke prevention at this time.  Therapy evaluations pending.  Risk factor modification  Heart cath today. If significant stenosis, this could explain infarcts in setting of cardiac arrest. If not, then we need to proceed with TEE and LOOP in am. I have this tentatively set up for tomorrow to evaluated for atrial clot/afib.  Gwendolyn Lima. Manson Passey, [facility], MBA, MHA [facility] Pager: [phone] 08/11/2013 8:15 AM  I have personally obtained a history, examined the patient, evaluated imaging results, and formulated the assessment and plan of care. I agree with the above. Delia Heady, MD

## 2013-08-11 NOTE — Progress Notes (Signed)
SUBJECTIVE: No complaints.   BP 143/67  Pulse 72  Temp(Src) 99 F (37.2 C) (Oral)  Resp 20  Ht 6\' 1"  (1.854 m)  Wt 212 lb 11.9 oz (96.5 kg)  BMI 28.07 kg/m2  SpO2 96%  Intake/Output Summary (Last 24 hours) at 08/11/13 3244 Last data filed at 08/11/13 0700  Gross per 24 hour  Intake 869.17 ml  Output   1300 ml  Net -430.83 ml    PHYSICAL EXAM General: Well developed, well nourished, in no acute distress. Alert and oriented x 3.  Psych:  Good affect, responds appropriately Neck: No JVD. No masses noted.  Lungs: Clear bilaterally with no wheezes or rhonci noted.  Heart: RRR with no murmurs noted. Abdomen: Bowel sounds are present. Soft, non-tender.  Extremities: No lower extremity edema.   LABS: Basic Metabolic Panel:  Recent Labs  10/10/70 0200  08/10/13 0455 08/11/13 0030  NA  --   < > 139 136  K  --   < > 3.7 3.6  CL  --   < > 105 103  CO2  --   < > 25 25  GLUCOSE  --   < > 121* 151*  BUN  --   < > 35* 29*  CREATININE  --   < > 1.92* 1.67*  CALCIUM  --   < > 8.5 8.5  MG 2.2  --   --   --   PHOS 4.4  --   --   --   < > = values in this interval not displayed. CBC:  Recent Labs  08/08/13 2102  08/10/13 0455 08/11/13 0030  WBC 8.3  < > 6.2 5.1  NEUTROABS 4.5  --   --   --   HGB 13.2  < > 12.2* 12.3*  HCT 41.6  < > 37.1* 37.4*  MCV 97.9  < > 92.8 91.2  PLT 96*  < > 98* 107*  < > = values in this interval not displayed. Cardiac Enzymes:  Recent Labs  08/10/13 1430 08/10/13 1902 08/11/13 0030  TROPONINI 1.47* 1.10* 1.17*   Fasting Lipid Panel:  Recent Labs  08/09/13 0200  CHOL 169  HDL 37*  LDLCALC 106*  TRIG 130  CHOLHDL 4.6    Current Meds: . aspirin EC  81 mg Oral Daily  . carvedilol  6.25 mg Oral BID WC  . enoxaparin (LOVENOX) injection  40 mg Subcutaneous Q24H  . insulin aspart  0-9 Units Subcutaneous TID WC  . insulin glargine  12 Units Subcutaneous QHS  . potassium chloride  20 mEq Oral Daily   Principal Problem:  VT (ventricular tachycardia) Active Problems:   DM (diabetes mellitus), type 2, uncontrolled with complications   HYPERLIPIDEMIA   HYPERTENSION   CONGESTIVE HEART FAILURE   Altered mental state   Acute on chronic renal failure   Acute respiratory failure with hypoxia   Lactic acidosis   Transaminitis   Syncope   CVA (cerebral infarction)  Echo 08/10/13: Left ventricle: Akinesis base/mid/apical inferolateral segments. Moderate hypokinesis mid/apical inferoseptal segments and mid/apical inferior segments. EF is 35-40%. The cavity size was normal. Wall thickness was increased in a pattern of mild LVH. Doppler parameters are consistent with abnormal left ventricular relaxation (grade 1 diastolic dysfunction). - Aortic valve: Moderately severe Aortic stenosis. Peak velocity: 342cm/s (S). Mean gradient: 29mm Hg (S). Peak gradient: 47mm Hg (S). - Right ventricle: The cavity size was mildly dilated. Systolic function was mildly reduced.  ASSESSMENT AND  PLAN:  1. Ischemic cardiomyopathy: Possible aborted VT/VF arrest prior to admission leading to AMS although also found to have small embolic strokes on MRI brain. NSVT on monitor during admission. He is known to have LVEF=35-40% by echo 08/10/13. Continue beta blocker. No ARB or Ace-inh secondary to renal insufficiency. Will get EP opinion before discharge in regards to possible ICD with syncopal event, documented NSVT, known ischemic cardiomyopathy with LVEF as low as 25% in past.   2. CAD/NSTEMI: Last cath 2008 with diffuse multi-vessel disease, PTCA only of RCA lesion as a stent could not be delivered into the vessel. Now with elevated troponin in setting of syncopal event. Will arrange repeat cath today as per plans of Dr. Myrtis Ser but I am not sure there will be any options for revascularization.   3. Atrial fibrillation: On admission. Now sinus. Will need long term anti-coagulation. Will wait to start after invasive workup complete.   4.  Chronic kidney disease: Renal function stable this am.     Matthew Bean  11/3/20148:22 AM

## 2013-08-11 NOTE — Evaluation (Signed)
Occupational Therapy Evaluation Patient Details Name: Matthew Bean MRN: [id number] DOB: [birth year] Today's Date: 08/11/2013 Time: 4098-1191 OT Time Calculation (min): 22 min  OT Assessment / Plan / Recommendation History of present illness Pt admit with Vtach, STEMI, CHF and AMS. MRI revealed small likely embolic strokes involving the right posterior frontal and right posterior temporal regions    Clinical Impression   Pt admitted with above.  He demonstrates cognitive deficits including impaired attention, memory, initiation, safety and judgement.  Pt was unable to accurately participate in visual assessment due to attention deficits.  Pt. With DOE 3/4 with minimal ADL activity.  Vitals all stable throughout eval.  He will benefit from continued OT to address the below listed deficits and allow him to return home at a min guard to supervision level.  He will requires 24 hour supervision/assist at discharge.     OT Assessment  Patient needs continued OT Services    Follow Up Recommendations  Home health OT;Supervision/Assistance - 24 hour    Barriers to Discharge      Equipment Recommendations  None recommended by OT    Recommendations for Other Services    Frequency  Min 2X/week    Precautions / Restrictions Precautions Precautions: Fall   Pertinent Vitals/Pain     ADL  Eating/Feeding: Supervision/safety;Set up Where Assessed - Eating/Feeding: Bed level Grooming: Wash/dry hands;Wash/dry face;Brushing hair;Supervision/safety Where Assessed - Grooming: Unsupported sitting Upper Body Bathing: Minimal assistance Where Assessed - Upper Body Bathing: Unsupported sitting Lower Body Bathing: Minimal assistance Where Assessed - Lower Body Bathing: Supported sit to stand Upper Body Dressing: Minimal assistance Where Assessed - Upper Body Dressing: Unsupported sitting Lower Body Dressing: Minimal assistance Where Assessed - Lower Body Dressing: Supported sit to Solicitor: Minimal assistance Toilet Transfer Method: Sit to stand;Stand pivot Acupuncturist: Bedside commode Toileting - Clothing Manipulation and Hygiene: Minimal assistance Where Assessed - Glass blower/designer Manipulation and Hygiene: Standing Transfers/Ambulation Related to ADLs: assist for balance ADL Comments: Asssist to inititate, and for balance    OT Diagnosis: Generalized weakness;Cognitive deficits  OT Problem List: Decreased strength;Decreased activity tolerance;Impaired balance (sitting and/or standing);Impaired vision/perception;Decreased cognition;Decreased safety awareness;Decreased knowledge of use of DME or AE;Cardiopulmonary status limiting activity OT Treatment Interventions: Self-care/ADL training;DME and/or AE instruction;Therapeutic activities;Cognitive remediation/compensation;Visual/perceptual remediation/compensation;Patient/family education;Balance training   OT Goals(Current goals can be found in the care plan section) Acute Rehab OT Goals Patient Stated Goal: to go home OT Goal Formulation: With patient Time For Goal Achievement: 08/25/13 Potential to Achieve Goals: Good ADL Goals Pt Will Perform Grooming: with min guard assist;standing Pt Will Perform Upper Body Bathing: with supervision;sitting Pt Will Perform Lower Body Bathing: with min guard assist;sit to/from stand Pt Will Perform Upper Body Dressing: with supervision;sitting Pt Will Perform Lower Body Dressing: with min guard assist;sit to/from stand Pt Will Transfer to Toilet: with min guard assist;ambulating;regular height toilet;grab bars Pt Will Perform Toileting - Clothing Manipulation and hygiene: with min guard assist;sit to/from stand Additional ADL Goal #1: Pt will demonstrate ability to alternate attention between 2 ADL tasks with no more than 2 cues  Visit Information  Last OT Received On: 08/11/13 Assistance Needed: +1 History of Present Illness: Pt admit with Vtach, STEMI, CHF  and AMS. MRI revealed small likely embolic strokes involving the right posterior frontal and right posterior temporal regions        Prior Functioning     Home Living Family/patient expects to be discharged to:: Private residence Living Arrangements: Spouse/significant other Available  Help at Discharge: Family;Available 24 hours/day Type of Home: House Home Access: Stairs to enter [employer] of Steps: 2 Entrance Stairs-Rails: None Home Layout: One level Home Equipment: Walker - 2 wheels;Cane - single point (rebath shower) Prior Function Level of Independence: Independent with assistive device(s) Comments: used SPC.  Reports he stopped driving ~ 2 weeks ago due to decreased vision (pt reports recent cataract surgery) Communication Communication: No difficulties Dominant Hand: Right         Vision/Perception Vision - History Baseline Vision: Wears glasses only for reading Visual History: Cataracts Patient Visual Report: No change from baseline Vision - Assessment Eye Alignment: Within Functional Limits Vision Assessment: Vision tested Ocular Range of Motion: Within Functional Limits Additional Comments: Pt unable to accurately participate in visual testing due to difficulty following instructions for testing.  Pt inconsistent with responses during confrontation testing for fields Perception Perception: Within Functional Limits Praxis Praxis: Impaired Praxis Impairment Details: Initiation   Cognition  Cognition Arousal/Alertness: Awake/alert Behavior During Therapy: Flat affect Overall Cognitive Status: Impaired/Different from baseline Area of Impairment: Orientation;Attention;Memory;Following commands;Safety/judgement;Awareness;Problem solving Orientation Level: Disoriented to;Time;Situation Current Attention Level: Selective Memory: Decreased short-term memory Following Commands: Follows one step commands with increased time Safety/Judgement: Decreased  awareness of safety;Decreased awareness of deficits Awareness: Intellectual Problem Solving: Slow processing;Decreased initiation;Requires verbal cues;Difficulty sequencing General Comments: Pt is slow to initiate.  Self distracts    Extremity/Trunk Assessment Upper Extremity Assessment Upper Extremity Assessment: Overall WFL for tasks assessed Lower Extremity Assessment Lower Extremity Assessment: Defer to PT evaluation Cervical / Trunk Assessment Cervical / Trunk Assessment: Normal     Mobility Bed Mobility Bed Mobility: Supine to Sit;Sitting - Scoot to [place] of Bed;Sit to Supine Supine to Sit: 4: Min assist;With rails Sitting - Scoot to [place] of Bed: 4: Min guard Sit to Supine: 4: Min guard Details for Bed Mobility Assistance: min a to initiate movement Transfers Transfers: Sit to Stand;Stand to Sit Sit to Stand: 4: Min assist;With upper extremity assist;From bed Stand to Sit: 4: Min assist;With upper extremity assist;To bed Details for Transfer Assistance: verbal cues for hand placement and min A for safety     Exercise     Balance     End of Session OT - End of Session Activity Tolerance: Patient limited by fatigue Patient left: in bed;with call bell/phone within reach Nurse Communication: Mobility status  GO     Deara Bober M 08/11/2013, 4:59 PM

## 2013-08-11 NOTE — Progress Notes (Addendum)
[facility] PROGRESS NOTE  Matthew Bean ZOX:[id number] DOB: 21-Jul-1933 DOA: 08/08/2013 PCP: [name], MD  Brief narrative: Pt is 79 yo male with complicated medical conditions including HTN, HLD, DM, systolic and diastolic CHF, presented to [facility] ED with main concern of sudden onset altered mental status. Pt is unable to provide history due to confusion and family at bedside unable to provide details as they have not witnessed the events. There has been no reported fevers, chills, no chest pain. In ED, pt found to be initially lethargic with lactic acid > 9, with pH 7.2 and pO2 60. Pt was given 500 cc NS and his mental status improved, lactic acid down to 4 and oxygen saturation 100% on 4 L [place].  Work up has revealed elevated Troponin, acute small strokes.  Cardiology following, favors aborted VT/VF and need for cath and likely ICD.  Neurology also following and stroke work up underway.    Principal Problem: 1. VT (ventricular tachycardia) and Syncope and  CVA - Presumed cause for his syncope was a non sustained episode of VT/VF which may have caused his stroke as well - TTE done today; EF of 35-40%, mild LVH, grade 1 diastolic dysfunction -no new episodes; appreciate cardiology input; Continue beta blocker. No ARB or Ace-inh secondary to renal insufficiency.  -per cardiology: pend EP opinion before discharge in regards to possible ICD with syncopal event, documented NSVT, known ischemic cardiomyopathy with LVEF as low as 25% in past.   Active Problems: 2. Acute on chronic renal failure - Cr stable; Lasix held; Low rate of fluids started today by Cardiology  3. CVA (cerebral infarction); MRI: At least two and probably three acute subcentimeter infarcts - Per Neurology, consider starting statin and consider MRA - ASA 81mg   4. Transaminitis - Improving today, likely due to acute illness  5. DM (diabetes mellitus), type 2, uncontrolled with complications - A1C 6.8; On home lantus  and ssi - episode of hypoglycemia; likely due to AKI; keep d5% IV  6. HYPERTENSION - BP slightly higher today, ACE-I on hold due to renal function - Could consider restarting norvasc tomorrow if BP continues to be high - Lasix on hold - On metoprolol  7. CONGESTIVE HEART FAILURE - Repeat TTE above, appears improved - I/Os, low rate fluids - Cardiology following - Holding lasix  8. Lactic acidosis Resolved -fever, no obvious source for infection; UA, CXR unremarkable; obtain CT chest r/o PNA;  cont monitoring;   9. CAD/NSTEMI: Last cath 2008 with diffuse multi-vessel disease, PTCA only of RCA  -per cardiology: cath today   D/w patient, wife; patient is DNR;   Consultants:  Cardiology  Neurology  Procedures/Studies: Mr Brain Wo Contrast  08/09/2013   CLINICAL DATA:  Sudden onset of altered mental status. Stroke risk factors include hypertension, hyperlipidemia, diabetes, and renal failure.  EXAM: MRI HEAD WITHOUT CONTRAST  TECHNIQUE: Multiplanar, multisequence MR imaging was performed. No intravenous contrast was administered.  COMPARISON:  CT head 08/08/2013.  FINDINGS: Acute subcentimeter right frontal cortical infarction (image 26 series 3). Acute left posterior frontal subcentimeter cortical infarct (image 29 series 3). Probable acute right posterior temporal subcentimeter infarct (image 12 series 3), persists on thin DWI series. Attention to this area on followup MR as clinically indicated.  Severe cerebral and cerebellar atrophy. Remote bilateral cortical infarcts with brain substance loss, most notable right parietal region. Remote bilateral right greater than left cerebellar infarcts. Extensive chronic microvascular ischemic change throughout the periventricular and subcortical white matter.  Small foci of chronic hemorrhage are scattered throughout both cerebral hemispheres likely sequelae of hypertensive cerebrovascular disease. No midline shift. Flow voids are maintained. No  osseous lesions. No acute mastoid disease. Small amount of left sphenoid sinus fluid could be acute.  IMPRESSION: At least two and probably three acute subcentimeter infarcts as described. Suspect shower of emboli.  Advanced atrophy and chronic microvascular ischemic change with numerous remote infarcts and microbleeds   Electronically Signed   By: Davonna Belling M.D.   On: 08/09/2013 12:08     Antibiotics:  None  Code: Full  HPI/Subjective: Did well overnight, no acute events.  No complaints this AM.   Objective: Filed Vitals:   08/10/13 2000 08/11/13 0020 08/11/13 0411 08/11/13 0735  BP: 150/77 135/61 135/65 143/67  Pulse: 88 75 65 72  Temp: 99.5 F (37.5 C) 99.7 F (37.6 C) 99 F (37.2 C)   TempSrc: Oral Oral Oral Oral  Resp: 27 27 23 20   Height:      Weight:   96.5 kg (212 lb 11.9 oz)   SpO2: 93% 95% 98% 96%    Intake/Output Summary (Last 24 hours) at 08/11/13 1150 Last data filed at 08/11/13 0900  Gross per 24 hour  Intake 919.17 ml  Output   1450 ml  Net -530.83 ml    Exam:   General:  Pt is alert and oriented to person, place, time, follows commands appropriately, not in acute distress  Cardiovascular: Regular rate and rhythm (SR by telemetry), distant heart sounds, 2-3/6 systolic murmur  Respiratory: Clear to auscultation bilaterally, no wheezing  Abdomen: Soft, non tender, bowel sounds present  Extremities: No edema, warm and dry  Neuro: Grossly nonfocal  Data Reviewed: Basic Metabolic Panel:  Recent Labs Lab 08/08/13 2113 08/08/13 2119 08/09/13 0200 08/09/13 0612 08/10/13 0455 08/11/13 0030  NA 138 141  --  137 139 136  K 3.6 3.6  --  4.0 3.7 3.6  CL 100 106  --  104 105 103  CO2 15*  --   --  21 25 25   GLUCOSE 327* 276*  --  144* 121* 151*  BUN 31* 32*  --  34* 35* 29*  CREATININE 2.09* 2.20*  --  1.99* 1.92* 1.67*  CALCIUM 8.1*  --   --  8.0* 8.5 8.5  MG  --   --  2.2  --   --   --   PHOS  --   --  4.4  --   --   --    Liver Function  Tests:  Recent Labs Lab 08/08/13 2113 08/09/13 0612 08/10/13 0455  AST 93* 153* 50*  ALT 59* 132* 81*  ALKPHOS 72 77 72  BILITOT 0.6 0.5 0.5  PROT 6.6 6.3 6.8  ALBUMIN 2.8* 2.7* 2.9*   CBC:  Recent Labs Lab 08/08/13 2102 08/08/13 2119 08/09/13 0612 08/10/13 0455 08/11/13 0030  WBC 8.3  --  5.6 6.2 5.1  NEUTROABS 4.5  --   --   --   --   HGB 13.2 15.0 12.3* 12.2* 12.3*  HCT 41.6 44.0 37.3* 37.1* 37.4*  MCV 97.9  --  92.1 92.8 91.2  PLT 96*  --  87* 98* 107*   Cardiac Enzymes:  Recent Labs Lab 08/09/13 1015 08/09/13 1705 08/10/13 1430 08/10/13 1902 08/11/13 0030  TROPONINI 2.66* 2.35* 1.47* 1.10* 1.17*   BNP: No components found with this basename: POCBNP,  CBG:  Recent Labs Lab 08/10/13 0802 08/10/13 1244 08/10/13 1727 08/10/13  2118 08/11/13 0739  GLUCAP 99 174* 142* 210* 80    Recent Results (from the past 240 hour(s))  CULTURE, BLOOD (ROUTINE X 2)     Status: None   Collection Time    08/09/13 12:30 AM      Result Value Range Status   Specimen Description BLOOD RIGHT THUMB   Final   Special Requests BOTTLES DRAWN AEROBIC ONLY 6CC   Final   Culture  Setup Time     Final   Value: 08/09/2013 04:03     Performed at [facility]   Culture     Final   Value:        BLOOD CULTURE RECEIVED NO GROWTH TO DATE CULTURE WILL BE HELD FOR 5 DAYS BEFORE ISSUING A FINAL NEGATIVE REPORT     Performed at [facility]   Report Status PENDING   Incomplete  CULTURE, BLOOD (ROUTINE X 2)     Status: None   Collection Time    08/09/13 12:41 AM      Result Value Range Status   Specimen Description BLOOD LEFT HAND   Final   Special Requests BOTTLES DRAWN AEROBIC ONLY 1CC   Final   Culture  Setup Time     Final   Value: 08/09/2013 04:07     Performed at [facility]   Culture     Final   Value:        BLOOD CULTURE RECEIVED NO GROWTH TO DATE CULTURE WILL BE HELD FOR 5 DAYS BEFORE ISSUING A FINAL NEGATIVE REPORT     Performed at [facility]   Report Status PENDING   Incomplete  MRSA PCR SCREENING     Status: None   Collection Time    08/09/13  1:06 AM      Result Value Range Status   MRSA by PCR NEGATIVE  NEGATIVE Final   Comment:            The GeneXpert MRSA Assay (FDA     approved for NASAL specimens     only), is one component of a     comprehensive MRSA colonization     surveillance program. It is not     intended to diagnose MRSA     infection nor to guide or     monitor treatment for     MRSA infections.  URINE CULTURE     Status: None   Collection Time    08/09/13  1:12 AM      Result Value Range Status   Specimen Description URINE, RANDOM   Final   Special Requests NONE   Final   Culture  Setup Time     Final   Value: 08/09/2013 19:19     Performed at [facility] Count     Final   Value: 5,000 COLONIES/ML     Performed at [facility]   Culture     Final   Value: INSIGNIFICANT GROWTH     Performed at [facility]   Report Status 08/11/2013 FINAL   Final     Scheduled Meds: . aspirin EC  81 mg Oral Daily  . carvedilol  6.25 mg Oral BID WC  . enoxaparin (LOVENOX) injection  40 mg Subcutaneous Q24H  . insulin aspart  0-9 Units Subcutaneous TID WC  . insulin glargine  12 Units Subcutaneous QHS  . potassium chloride  20 mEq Oral Daily  . sodium chloride  3 mL Intravenous Q12H   Continuous Infusions: . sodium chloride 50 mL/hr at 08/10/13 1601  . sodium chloride       Esperanza Sheets, MD  [facility] Pager [phone]  If 7PM-7AM, please contact night-coverage [email or web address] Password [email or web address] 08/11/2013, 11:50 AM   LOS: 3 days

## 2013-08-11 NOTE — Progress Notes (Signed)
Chaplain provided pastoral care and emotional support to the patient/family. Chaplain engaged in conversation with patient's wife, Colman Cater and guest, Gracey Lions.  Chaplain provided spiritual support through prayer. Chaplain will follow up as needed or requested.   08/11/13 1330  Clinical Encounter Type  Visited With Patient and family together  Visit Type Initial;Spiritual support;Social support  Spiritual Encounters  Spiritual Needs Prayer

## 2013-08-11 NOTE — Progress Notes (Signed)
Physical Therapy Evaluation Patient Details Name: Matthew Bean MRN: [id number] DOB: August 07, 1933 Today's Date: 08/11/2013 Time: 4098-1191 PT Time Calculation (min): 10 min  PT Assessment / Plan / Recommendation History of Present Illness  Pt admit with Vtach, CHF and AMS.  Clinical Impression  Pt admitted with above. Pt currently with functional limitations due to the deficits listed below (see PT Problem List). Will benefit from HHPT f/u and 24 hour care by wife.  Pt will benefit from skilled PT to increase their independence and safety with mobility to allow discharge to the venue listed below.     PT Assessment  Patient needs continued PT services    Follow Up Recommendations  Home health PT;Supervision/Assistance - 24 hour                Equipment Recommendations  None recommended by PT         Frequency Min 3X/week    Precautions / Restrictions Precautions Precautions: Fall Restrictions Weight Bearing Restrictions: No   Pertinent Vitals/Pain HR 72 bpm, 99% O2 on 2LO2, 147/75, no pain      Mobility  Bed Mobility Bed Mobility: Rolling Right;Right Sidelying to Sit;Sitting - Scoot to [place] of Bed Rolling Right: 4: Min assist;With rail Right Sidelying to Sit: 4: Min assist;With rails;HOB elevated Sitting - Scoot to [place] of Bed: 4: Min assist;With rail Details for Bed Mobility Assistance: Pt needed min assist for bed mobility for bringing LEs off bed.   Transfers Transfers: Sit to Stand;Stand to Sit Sit to Stand: 4: Min assist;With upper extremity assist;From bed Stand to Sit: 4: Min assist;With upper extremity assist;With armrests;To chair/3-in-1 Details for Transfer Assistance: Pt needed cues for hand placement.  Marched in place at bedside.  Did not ambulate secondary to pt going for CATH today.  Steading assist provided.   Ambulation/Gait Ambulation/Gait Assistance: Not tested (comment) Stairs: No Wheelchair Mobility Wheelchair Mobility: No    Exercises General  Exercises - Lower Extremity Hip Flexion/Marching: AROM;Both;10 reps;Standing   PT Diagnosis: Generalized weakness  PT Problem List: Decreased activity tolerance;Decreased balance;Decreased mobility;Decreased knowledge of use of DME;Decreased safety awareness;Decreased knowledge of precautions;Decreased strength PT Treatment Interventions: Gait training;DME instruction;Functional mobility training;Therapeutic activities;Therapeutic exercise;Balance training;Patient/family education;Stair training     PT Goals(Current goals can be found in the care plan section) Acute Rehab PT Goals Patient Stated Goal: to go home PT Goal Formulation: With patient Time For Goal Achievement: 08/18/13 Potential to Achieve Goals: Good  Visit Information  Last PT Received On: 08/11/13 Assistance Needed: +1 History of Present Illness: Pt admit with Vtach, CHF and AMS.       Prior Functioning  Home Living Family/patient expects to be discharged to:: Private residence Living Arrangements: Spouse/significant other Available Help at Discharge: Family;Available 24 hours/day Type of Home: House Home Access: Stairs to enter [employer] of Steps: 2 Entrance Stairs-Rails: None Home Layout: One level Home Equipment: Walker - 2 wheels;Cane - single point (rebath shower) Prior Function Level of Independence: Independent with assistive device(s) (used cane per pt ) Communication Communication: No difficulties    Cognition  Cognition Arousal/Alertness: Awake/alert Behavior During Therapy: WFL for tasks assessed/performed Overall Cognitive Status: Impaired/Different from baseline Area of Impairment: Orientation;Following commands;Safety/judgement;Awareness;Problem solving Orientation Level: Disoriented to;Time;Situation Following Commands: Follows one step commands with increased time Safety/Judgement: Decreased awareness of safety;Decreased awareness of deficits Problem Solving: Slow  processing;Requires verbal cues    Extremity/Trunk Assessment Upper Extremity Assessment Upper Extremity Assessment: Defer to OT evaluation Lower Extremity Assessment Lower Extremity Assessment: Generalized  weakness Cervical / Trunk Assessment Cervical / Trunk Assessment: Normal   Balance Balance Balance Assessed: Yes Static Standing Balance Static Standing - Balance Support: During functional activity;No upper extremity supported Static Standing - Level of Assistance: 4: Min assist Static Standing - Comment/# of Minutes: 2  End of Session PT - End of Session Equipment Utilized During Treatment: Gait belt;Oxygen Activity Tolerance: Patient tolerated treatment well Patient left: in bed;with call bell/phone within reach;with bed alarm set Nurse Communication: Mobility status       INGOLD,Johnchristopher Sarvis 08/11/2013, 9:43 AM  [facility] [phone] (pager)

## 2013-08-11 NOTE — Progress Notes (Signed)
Patient ID: Matthew Bean, male   DOB: 1932/12/20, 79 y.o.   MRN: [id number] Matthew Bean to see patient to do heart cath Patient has been in CT past hour ? noncontrast CT ordered to r/o pneumonia. Not read yet but  To my eye no pneumonia seen He is adamant about not wanting cath today.   He is willing to have tomorrow.   Note patient is DNR ? Evaluation of AICD in light of this.  Will resume diet and plan on cath in am  2nd case available with CM Patient amenable to this.  Charlton Haws 5:04 PM

## 2013-08-12 ENCOUNTER — Encounter (HOSPITAL_COMMUNITY): Payer: Self-pay

## 2013-08-12 ENCOUNTER — Encounter (HOSPITAL_COMMUNITY)
Admission: EM | Disposition: A | Discharge status: Home Health | Payer: Self-pay | Source: Home / Self Care | Attending: Internal Medicine

## 2013-08-12 DIAGNOSIS — J189 Pneumonia, unspecified organism: Secondary | ICD-10-CM

## 2013-08-12 DIAGNOSIS — I059 Rheumatic mitral valve disease, unspecified: Secondary | ICD-10-CM

## 2013-08-12 DIAGNOSIS — I2510 Atherosclerotic heart disease of native coronary artery without angina pectoris: Secondary | ICD-10-CM

## 2013-08-12 HISTORY — PX: TEE WITHOUT CARDIOVERSION: SHX5443

## 2013-08-12 HISTORY — PX: LEFT HEART CATHETERIZATION WITH CORONARY ANGIOGRAM: SHX5451

## 2013-08-12 LAB — GLUCOSE, CAPILLARY
Glucose-Capillary: 102 mg/dL — ABNORMAL HIGH (ref 70–99)
Glucose-Capillary: 63 mg/dL — ABNORMAL LOW (ref 70–99)
Glucose-Capillary: 73 mg/dL (ref 70–99)

## 2013-08-12 LAB — CBC
HCT: 38.2 % — ABNORMAL LOW (ref 39.0–52.0)
Hemoglobin: 12.6 g/dL — ABNORMAL LOW (ref 13.0–17.0)
MCH: 30.4 pg (ref 26.0–34.0)
MCHC: 33 g/dL (ref 30.0–36.0)
MCV: 92.3 fL (ref 78.0–100.0)

## 2013-08-12 LAB — BASIC METABOLIC PANEL
BUN: 20 mg/dL (ref 6–23)
CO2: 26 mEq/L (ref 19–32)
Chloride: 102 mEq/L (ref 96–112)
Creatinine, Ser: 1.53 mg/dL — ABNORMAL HIGH (ref 0.50–1.35)
GFR calc Af Amer: 48 mL/min — ABNORMAL LOW (ref >90)
Glucose, Bld: 126 mg/dL — ABNORMAL HIGH (ref 70–99)
Potassium: 3.9 mEq/L (ref 3.5–5.1)

## 2013-08-12 SURGERY — ECHOCARDIOGRAM, TRANSESOPHAGEAL
Anesthesia: Moderate Sedation

## 2013-08-12 SURGERY — LEFT HEART CATHETERIZATION WITH CORONARY ANGIOGRAM
Anesthesia: LOCAL

## 2013-08-12 MED ORDER — FENTANYL CITRATE 0.05 MG/ML IJ SOLN
INTRAMUSCULAR | Status: AC
  Filled 2013-08-12: qty 2

## 2013-08-12 MED ORDER — ONDANSETRON HCL 4 MG/2ML IJ SOLN: 4.0000 mg | Freq: Four times a day (QID) | INTRAMUSCULAR | Status: DC | PRN

## 2013-08-12 MED ORDER — ALBUTEROL SULFATE (5 MG/ML) 0.5% IN NEBU
5.0000 mg | INHALATION_SOLUTION | Freq: Once | RESPIRATORY_TRACT | Status: AC
  Administered 2013-08-12: 5 mg via RESPIRATORY_TRACT
  Filled 2013-08-12: qty 1

## 2013-08-12 MED ORDER — ASPIRIN 81 MG PO CHEW
81.0000 mg | CHEWABLE_TABLET | ORAL | Status: AC
  Administered 2013-08-12: 81 mg via ORAL
  Filled 2013-08-12: qty 1

## 2013-08-12 MED ORDER — MIDAZOLAM HCL 2 MG/2ML IJ SOLN
INTRAMUSCULAR | Status: AC
  Filled 2013-08-12: qty 2

## 2013-08-12 MED ORDER — IPRATROPIUM BROMIDE 0.02 % IN SOLN
0.5000 mg | Freq: Once | RESPIRATORY_TRACT | Status: AC
  Administered 2013-08-12: 0.5 mg via RESPIRATORY_TRACT
  Filled 2013-08-12: qty 2.5

## 2013-08-12 MED ORDER — LIDOCAINE HCL (PF) 1 % IJ SOLN
INTRAMUSCULAR | Status: AC
  Filled 2013-08-12: qty 30

## 2013-08-12 MED ORDER — SODIUM CHLORIDE 0.9 % IV SOLN: INTRAVENOUS | Status: DC

## 2013-08-12 MED ORDER — GUAIFENESIN-CODEINE 100-10 MG/5ML PO SOLN
5.0000 mL | ORAL | Status: DC | PRN
  Administered 2013-08-12 – 2013-08-13 (×3): 5 mL via ORAL
  Filled 2013-08-12 (×2): qty 5

## 2013-08-12 MED ORDER — NITROGLYCERIN 0.2 MG/ML ON CALL CATH LAB
INTRAVENOUS | Status: AC
  Filled 2013-08-12: qty 1

## 2013-08-12 MED ORDER — FLUMAZENIL 0.5 MG/5ML IV SOLN
INTRAVENOUS | Status: DC | PRN
  Administered 2013-08-12: .5 mg via INTRAVENOUS

## 2013-08-12 MED ORDER — HEPARIN (PORCINE) IN NACL 2-0.9 UNIT/ML-% IJ SOLN
INTRAMUSCULAR | Status: AC
  Filled 2013-08-12: qty 1000

## 2013-08-12 MED ORDER — MIDAZOLAM HCL 10 MG/2ML IJ SOLN
INTRAMUSCULAR | Status: DC | PRN
  Administered 2013-08-12 (×2): 2 mg via INTRAVENOUS

## 2013-08-12 MED ORDER — GUAIFENESIN-CODEINE 100-10 MG/5ML PO SOLN
ORAL | Status: AC
  Filled 2013-08-12: qty 5

## 2013-08-12 MED ORDER — MIDAZOLAM HCL 5 MG/ML IJ SOLN
INTRAMUSCULAR | Status: AC
  Filled 2013-08-12: qty 2

## 2013-08-12 MED ORDER — ACETAMINOPHEN 325 MG PO TABS: 650.0000 mg | ORAL_TABLET | ORAL | Status: DC | PRN

## 2013-08-12 MED ORDER — FENTANYL CITRATE 0.05 MG/ML IJ SOLN
INTRAMUSCULAR | Status: DC | PRN
  Administered 2013-08-12 (×2): 25 ug via INTRAVENOUS

## 2013-08-12 MED ORDER — CLOPIDOGREL BISULFATE 75 MG PO TABS
75.0000 mg | ORAL_TABLET | Freq: Every day | ORAL | Status: DC
  Administered 2013-08-13: 75 mg via ORAL
  Filled 2013-08-12: qty 1

## 2013-08-12 MED ORDER — BUTAMBEN-TETRACAINE-BENZOCAINE 2-2-14 % EX AERO
INHALATION_SPRAY | CUTANEOUS | Status: DC | PRN
  Administered 2013-08-12: 2 via TOPICAL

## 2013-08-12 MED ORDER — AMLODIPINE BESYLATE 5 MG PO TABS
5.0000 mg | ORAL_TABLET | Freq: Every day | ORAL | Status: DC
  Administered 2013-08-13: 10:00:00 5 mg via ORAL
  Filled 2013-08-12 (×2): qty 1

## 2013-08-12 MED ORDER — CARVEDILOL 12.5 MG PO TABS
12.5000 mg | ORAL_TABLET | Freq: Two times a day (BID) | ORAL | Status: DC
  Administered 2013-08-12 – 2013-08-13 (×2): 12.5 mg via ORAL
  Filled 2013-08-12 (×6): qty 1

## 2013-08-12 NOTE — H&P (View-Only) (Signed)
     SUBJECTIVE: No chest pain or SOB.   BP 149/68  Pulse 74  Temp(Src) 98.6 F (37 C) (Oral)  Resp 24  Ht 6\' 1"  (1.854 m)  Wt 209 lb 3.5 oz (94.9 kg)  BMI 27.61 kg/m2  SpO2 98%  Intake/Output Summary (Last 24 hours) at 08/12/13 0837 Last data filed at 08/12/13 0700  Gross per 24 hour  Intake   1290 ml  Output   1200 ml  Net     90 ml    PHYSICAL EXAM General: Well developed, well nourished, in no acute distress. Alert and oriented x 3.  Psych:  Good affect, responds appropriately Neck: No JVD. No masses noted.  Lungs: Clear bilaterally with no wheezes or rhonci noted.  Heart: RRR with systolic murmur noted. Abdomen: Bowel sounds are present. Soft, non-tender.  Extremities: No lower extremity edema.   LABS: Basic Metabolic Panel:  Recent Labs  95/62/13 0030 08/12/13 0535  NA 136 138  K 3.6 3.9  CL 103 102  CO2 25 26  GLUCOSE 151* 126*  BUN 29* 20  CREATININE 1.67* 1.53*  CALCIUM 8.5 8.9   CBC:  Recent Labs  08/11/13 0030 08/12/13 0359  WBC 5.1 7.5  HGB 12.3* 12.6*  HCT 37.4* 38.2*  MCV 91.2 92.3  PLT 107* 141*   Cardiac Enzymes:  Recent Labs  08/10/13 1430 08/10/13 1902 08/11/13 0030  TROPONINI 1.47* 1.10* 1.17*   Current Meds: . amLODipine  5 mg Oral Daily  . aspirin EC  81 mg Oral Daily  . carvedilol  12.5 mg Oral BID WC  . enoxaparin (LOVENOX) injection  40 mg Subcutaneous Q24H  . insulin aspart  0-9 Units Subcutaneous TID WC  . insulin glargine  12 Units Subcutaneous QHS  . levofloxacin (LEVAQUIN) IV  750 mg Intravenous Q48H  . potassium chloride  20 mEq Oral Daily  . sodium chloride  3 mL Intravenous Q12H     ASSESSMENT AND PLAN:   1. Ischemic cardiomyopathy: Possible aborted VT/VF arrest prior to admission leading to AMS although also found to have small embolic strokes on MRI brain. NSVT on monitor during admission. He is known to have LVEF=35-40% by echo 08/10/13. Continue beta blocker. No ARB or Ace-inh secondary to renal  insufficiency. Will get EP opinion before discharge in regards to possible ICD with syncopal event, documented NSVT, known ischemic cardiomyopathy with LVEF as low as 25% in past although I am not sure an ICD is appropriate given his current DNR status.   2. CAD/NSTEMI: Last cath 2008 with diffuse multi-vessel disease, PTCA only of RCA lesion as a stent could not be delivered into the vessel. Now with elevated troponin in setting of syncopal event. Will arrange repeat cath today as per plans of Dr. Myrtis Ser but I am not sure there will be any options for revascularization.   3. Atrial fibrillation: On admission. Now sinus. Will need long term anti-coagulation. Will wait to start after invasive workup complete. TEE today per Neurology plans to exclude intracardiac source for embolus.   4. Chronic kidney disease: Renal function stable this am.    Querida Beretta  11/4/20148:37 AM

## 2013-08-12 NOTE — Progress Notes (Signed)
  Echocardiogram Echocardiogram Transesophageal has been performed.  Matthew Bean 08/12/2013, 2:57 PM

## 2013-08-12 NOTE — CV Procedure (Signed)
     Cardiac Cath Note  JERYL UMHOLTZ [id number] 07/17/1933  Procedure: left  Heart Cardiac Catheterization Note Indications: syncope, altered mental status, ? CVA   Procedure Details Consent: Obtained Time Out: Verified patient identification, verified procedure, site/side was marked, verified correct patient position, special equipment/implants available, Radiology Safety Procedures followed,  medications/allergies/relevent history reviewed, required imaging and test results available.  Performed   Medications: Fentanyl: 50 mcg iv Versed: 2 mg IV  The right femoral artery was easily canulated using a modified Seldinger technique.  Hemodynamics:   LV pressure: not obtained Aortic pressure: 108/56  Angiography   Left Main: mild - moderate calcification  Left anterior Descending: moderate - heavily calcified.  There is a proximal, eccentric  stenosis of 70-80% which is unchanged from his cath in 2008.  The 1st diag has a 90% stenosis at the origin.  The mid LAD stent has mild instent restenosis.  The mid- distal LAD has diffuse mild - moderate irregularities.  The terminal LAD wraps around the apex.   Left Circumflex: moderate - large size vessel.  There are moderate diffuse irregularities .  The tightest segment is between 40-50% in severity.   Right Coronary Artery: moderate in size.  Diffuse moderate disease.  The mid vessel (previous PCI site) looks ok.  No severe stenosis.   LV Gram: not performed due to CKD, stage 3.  His creatinine has improved form 1.9 to 1.5 overnight  Complications: No apparent complications Patient did tolerate procedure well.  Contrast used: 40 cc  Conclusions:   1. Moderate - severe diffuse CAD primarily involving the proximal LAD.  This stenosis is unchanged from his previous cath in 2008.   The aortic valve was briefly crossed with the RCA catheter but caused PVCs.   We were going to measure pressures only and had not planned on doing an LV  gram.   The catheter was pulled out during the time we were trying to locate a quiet location for the catheter.   We were no able to cross with the J wire or pigtail catheter after this despite multiple tries.    He has known moderate AS and is scheduled for TEE today.    Vesta Mixer, Montez Hageman., MD, [facility] 08/12/2013, 9:35 AM Office - [phone] Pager [phone]

## 2013-08-12 NOTE — Consult Note (Signed)
ELECTROPHYSIOLOGY CONSULT NOTE    Patient ID: Matthew Bean MRN: [id number], DOB/AGE: Jul 29, 1933 79 y.o.  Admit date: 08/08/2013 Date of Consult: 08-12-2013  Primary Physician: [name], MD Primary Cardiologist: Doylene Bode, MD  Reason for Consultation: cryptogenic stroke  HPI:  Matthew Bean is a 79 year old male with a past medical history significant for coronary artery disease (s/p stent to LAD and PTCA of circumflex, PTCA to RCA in 2008), ischemic cardiomyopathy, moderate AS, hyperlipidemia, diabetes, hypertension, chronic renal insufficiency and dementia.    He was last seen in the office by Norma Fredrickson, NP at which time it was felt that his cardiomyopathy symptoms were not progressive.  It was felt at that time that his dementia was his most limiting factor.  On the day of admission, he was found by his wife to be confused and unresponsive.  Admission labs were notable for a lactic acid >9, pH 7.2 and pO2 60.  He was given a fluid bolus with improvement in his mental status.  By report, there was atrial fibrillation seen on admission, but EMS strips are all sinus rhythm and there are no documented strips of atrial fibrillation in the chart.    This admission, he has been evaluated with cardiac catheterization which demonstrated moderate-severe diffuse CAD primarily involving the proximal LAD that was unchanged from previous cath in 2008.  TEE is also being done today to evaluate degree of aortic stenosis.  Echocardiogram demonstrated EF 35-40% with akinesis base/mid/apical inferolateral segments. Moderate hypokinesis mid/apical inferoseptalsegments and mid/apical inferior segments.  MRI of the brain demonstrated at least two and probably three acute subcentimeter infarcts with suspected shower of emboli. Neurology has evaluated the patient and recommended TEE and ILR insertion to evaluate for atrial fibrillation.  Telemetry has demonstrated normal sinus rhythm with run of NSVT.   There has been no atrial fibrillation documented.   EP has been asked to evaluate for treatment options.    Past Medical History  Diagnosis Date  . Hyperlipidemia   . Diabetes mellitus   . CHF (congestive heart failure)   . Hypertension   . Myocardial infarction   . Nephropathy   . Renal insufficiency   . CAD (coronary artery disease)   . Pulmonary nodule   . Rheumatoid arthritis(714.0)   . Hx of adenomatous colonic polyps      Surgical History:  Past Surgical History  Procedure Laterality Date  . Angioplasty  1997, 2008  . Coronary stent placement  2002    coronary  . Hernia repair      Inguinal and ventral  . Colonoscopy w/ polypectomy  02/20/2011    8 polyps removed, largest 1 cm, mix of adenomas, lymphoid aggregates and hyperplastic     Prescriptions prior to admission  Medication Sig Dispense Refill  . amLODipine (NORVASC) 10 MG tablet Take 10 mg by mouth daily.      Marland Kitchen aspirin EC 81 MG tablet Take 81 mg by mouth daily.      . furosemide (LASIX) 20 MG tablet Take 20 mg by mouth daily as needed for edema.      Marland Kitchen glyBURIDE (DIABETA) 5 MG tablet Take 5 mg by mouth 2 (two) times daily with a meal.      . hydrochlorothiazide (HYDRODIURIL) 25 MG tablet Take 25 mg by mouth daily.      . Insulin Glargine (LANTUS SOLOSTAR) 100 UNIT/ML SOPN Inject 12 Units into the skin at bedtime as needed (for high cbg).      Marland Kitchen  memantine (NAMENDA) 5 MG tablet Take 5 mg by mouth 2 (two) times daily.      . metoprolol (LOPRESSOR) 50 MG tablet Take 1 tablet (50 mg total) by mouth 2 (two) times daily.  60 tablet  3  . nitroGLYCERIN (NITROSTAT) 0.4 MG SL tablet Place 0.4 mg under the tongue every 5 (five) minutes as needed. For chest pain      . glucose blood (ONE TOUCH TEST STRIPS) test strip 1 each by Other route as needed. Use as instructed      . methotrexate (RHEUMATREX) 2.5 MG tablet Take 12.5 mg by mouth once a week.      Marland Kitchen RELION MINI PEN NEEDLES 31G X 6 MM MISC USE EVERY DAY  100 each  0      Inpatient Medications:  . [MAR HOLD] amLODipine  5 mg Oral Daily  . [facility] HOLD] aspirin EC  81 mg Oral Daily  . [facility] HOLD] carvedilol  12.5 mg Oral BID WC  . [MAR HOLD] enoxaparin (LOVENOX) injection  40 mg Subcutaneous Q24H  . [MAR HOLD] insulin aspart  0-9 Units Subcutaneous TID WC  . [MAR HOLD] levofloxacin (LEVAQUIN) IV  750 mg Intravenous Q48H  . [facility] HOLD] potassium chloride  20 mEq Oral Daily    Allergies: No Known Allergies  History   Social History  . Marital Status: Married    Spouse Name: N/A    Number of Children: 4  . Years of Education: N/A   Occupational History  . Retired [place] [employer]    Social History Main Topics  . Smoking status: Former Smoker    Quit date: 02/06/1989  . Smokeless tobacco: Former Neurosurgeon    Quit date: 02/06/2006  . Alcohol Use: No  . Drug Use: No  . Sexual Activity: Not Currently   Other Topics Concern  . Not on file   Social History Narrative  . No narrative on file     Family History  Problem Relation Age of Onset  . Stroke Mother   . Stroke Father     Physical Exam: Filed Vitals:   08/12/13 1410 08/12/13 1415 08/12/13 1420 08/12/13 1430  BP: 112/61 127/96 134/54 140/59  Pulse: 78 84 73 71  Temp:      TempSrc:      Resp: 16 26 21 25   Height:      Weight:      SpO2: 96% 94% 93% 99%    GEN- The patient is elderly appearing, alert  Head- normocephalic, atraumatic Eyes-  Sclera clear, conjunctiva pink Ears- hearing intact Oropharynx- clear Neck- supple  Lungs- Coarse BS, normal work of breathing Heart- Regular rate and rhythm, decreased HS GI- soft, NT, ND, + BS Extremities- no clubbing, cyanosis, or edema MS- diffuse muscle atrophy Skin- no rash or lesion  Labs:   Lab Results  Component Value Date   WBC 7.5 08/12/2013   HGB 12.6* 08/12/2013   HCT 38.2* 08/12/2013   MCV 92.3 08/12/2013   PLT 141* 08/12/2013    Recent Labs Lab 08/10/13 0455  08/12/13 0535  NA 139  < > 138  K 3.7  < > 3.9  CL 105   < > 102  CO2 25  < > 26  BUN 35*  < > 20  CREATININE 1.92*  < > 1.53*  CALCIUM 8.5  < > 8.9  PROT 6.8  --   --   BILITOT 0.5  --   --   ALKPHOS 72  --   --  ALT 81*  --   --   AST 50*  --   --   GLUCOSE 121*  < > 126*  < > = values in this interval not displayed. Lab Results  Component Value Date   CKTOTAL 278* 06/03/2012   CKMB 3.4 06/03/2012   TROPONINI 1.17* 08/11/2013   Lab Results  Component Value Date   CHOL 169 08/09/2013   CHOL 101 04/23/2013   CHOL 136 01/01/2013   Lab Results  Component Value Date   HDL 37* 08/09/2013   HDL 32.60* 04/23/2013   HDL 49.70 01/01/2013   Lab Results  Component Value Date   LDLCALC 106* 08/09/2013   LDLCALC 44 04/23/2013   LDLCALC 58 01/01/2013   Lab Results  Component Value Date   TRIG 130 08/09/2013   TRIG 122.0 04/23/2013   TRIG 140.0 01/01/2013   Lab Results  Component Value Date   CHOLHDL 4.6 08/09/2013   CHOLHDL 3 04/23/2013   CHOLHDL 3 01/01/2013    Radiology/Studies: Ct Head Wo Contrast 08/08/2013   CLINICAL DATA:  Unresponsive  EXAM: CT HEAD WITHOUT CONTRAST  TECHNIQUE: Contiguous axial images were obtained from the base of the skull through the vertex without intravenous contrast.  COMPARISON:  Prior CT from 06/02/2012  FINDINGS: Extensive age-related atrophy and chronic microvascular ischemic disease there is again seen, similar as compared to the prior examination. Ventricular size is unchanged. Prominent bilateral basal ganglia calcifications are noted. No acute intracranial hemorrhage or large vessel territory infarct. No extra-axial fluid collection. Remote right cerebellar infarct and right parietal infarcts again noted. No mass lesion or midline shift. Calvarium is intact. Orbital soft tissues are within normal limits. The paranasal sinuses and mastoid air cells are clear.  IMPRESSION: 1. No acute intracranial process. 2. Stable appearance of extensive global atrophy and chronic microvascular ischemic disease. 3. Remote right  cerebellar and right frontoparietal infarcts.   Electronically Signed   By: Rise Mu M.D.   On: 08/08/2013 22:34   Ct Chest Wo Contrast 08/11/2013   CLINICAL DATA:  Congestion and nonproductive cough. Evaluate for pneumonia.  EXAM: CT CHEST WITHOUT CONTRAST  TECHNIQUE: Multidetector CT imaging of the chest was performed following the standard protocol without IV contrast.  COMPARISON:  07/22/2007.  FINDINGS: Lower right paratracheal lymph node measures 12 mm (previously 10 mm). No definite hilar or axillary adenopathy. A lipoma in the left subscapularis muscle is incidentally noted. Extensive 3 vessel coronary artery calcification. Aortic valvular calcification. Heart size normal. No pericardial effusion. Ascending aorta does not appear dilated.  There is patchy airspace consolidation in the anterior segment right upper lobe. Basilar dependent pulmonary parenchymal scarring and mild architectural distortion, even fibrotic change, appear unchanged. Previously seen 5 mm peripheral right lower lobe nodule is less prominent. No pleural fluid. Airway is unremarkable.  Incidental imaging of the upper abdomen shows no acute findings. There is streak artifact related to the patient's arms. No worrisome lytic or sclerotic lesions. Degenerative changes are seen in the spine.  IMPRESSION: 1. Right upper lobe airspace consolidation, indicative of pneumonia. Consider CT chest without contrast in 4-6 weeks to ensure clearing and exclude primary bronchogenic carcinoma. These results will be called to the ordering clinician or representative by the Radiologist Assistant, and communication documented in the PACS Dashboard. 2. Extensive 3 vessel coronary artery calcification.   Electronically Signed   By: Leanna Battles M.D.   On: 08/11/2013 16:50   Mr Brain Wo Contrast 08/09/2013   CLINICAL DATA:  Sudden  onset of altered mental status. Stroke risk factors include hypertension, hyperlipidemia, diabetes, and renal  failure.  EXAM: MRI HEAD WITHOUT CONTRAST  TECHNIQUE: Multiplanar, multisequence MR imaging was performed. No intravenous contrast was administered.  COMPARISON:  CT head 08/08/2013.  FINDINGS: Acute subcentimeter right frontal cortical infarction (image 26 series 3). Acute left posterior frontal subcentimeter cortical infarct (image 29 series 3). Probable acute right posterior temporal subcentimeter infarct (image 12 series 3), persists on thin DWI series. Attention to this area on followup MR as clinically indicated.  Severe cerebral and cerebellar atrophy. Remote bilateral cortical infarcts with brain substance loss, most notable right parietal region. Remote bilateral right greater than left cerebellar infarcts. Extensive chronic microvascular ischemic change throughout the periventricular and subcortical white matter.  Small foci of chronic hemorrhage are scattered throughout both cerebral hemispheres likely sequelae of hypertensive cerebrovascular disease. No midline shift. Flow voids are maintained. No osseous lesions. No acute mastoid disease. Small amount of left sphenoid sinus fluid could be acute.  IMPRESSION: At least two and probably three acute subcentimeter infarcts as described. Suspect shower of emboli.  Advanced atrophy and chronic microvascular ischemic change with numerous remote infarcts and microbleeds   Electronically Signed   By: Davonna Belling M.D.   On: 08/09/2013 12:08   Dg Chest Portable 1 View 08/08/2013   CLINICAL DATA:  Altered mental status. Hypertension. Atrial fibrillation  EXAM: PORTABLE CHEST - 1 VIEW  COMPARISON:  06/02/2012  FINDINGS: Chronic cardiomegaly, stable from prior. Mediastinal contours distorted by rightward rotation. Chronic interstitial coarsening at the bases. No acute infiltrate or edema. No effusion or pneumothorax. Linear high attenuation overlapping the lower right neck is likely either the thyroid cartilage or external to the patient.No history of neck pain.   IMPRESSION: Negative for edema or consolidation.   Electronically Signed   By: Tiburcio Pea M.D.   On: 08/08/2013 21:26   MWU:[id number] rhythm at 70, PR 230, otherwise normal intervals  TELEMETRY: sinus rhythm with run of NSVT, no atrial fibrillation   Assessment and Plan:  1. AMS/ Embolic stroke on MRI The patient presents with AMS.  MRI showed multiple embolic infarcts.  EP is consulted to consider implantable loop recorder.   EP has spoken with Dr Clifton  and we are in agreement that given his advanced dementia that he is a poor candidate for EP procedures.  I think that at this point, it is most reasonable to place a 30 day monitor and have him follow-up.  If he has no arrhythmias, is felt to be a candidate for anticoagulation (presently not due to dementia and difficulty taking medicines at home), and desires implantable loop recorder at that point then this could be reconsidered at that time.  EP will see as needed while here. Please call with questions.

## 2013-08-12 NOTE — Progress Notes (Signed)
Occupational Therapy Note  OT cancelled today due to pt in procedure.  Will reattempt tomorrow.   Jeani Hawking, OTR/L [phone]

## 2013-08-12 NOTE — Interval H&P Note (Signed)
History and Physical Interval Note:  08/12/2013 8:57 AM  Matthew Bean  has presented today for surgery, with the diagnosis of cp  The various methods of treatment have been discussed with the patient and family. After consideration of risks, benefits and other options for treatment, the patient has consented to  Procedure(s): LEFT HEART CATHETERIZATION WITH CORONARY ANGIOGRAM (N/A) as a surgical intervention .  The patient's history has been reviewed, patient examined, no change in status, stable for surgery.  I have reviewed the patient's chart and labs.  Questions were answered to the patient's satisfaction.     Elyn Aquas.

## 2013-08-12 NOTE — Progress Notes (Addendum)
Stroke Team Progress Note  HISTORY Matthew Bean is a 79 y.o. male with. diabetes mellitus, hypertension, hyperlipidemia, coronary artery disease and congestive heart failure, who was brought to the hospital following an episode of loss of consciousness at home thought  Likely to have been syncopal in nature with possible transient ventricular tachycardia or fibrillation. Lactic acid was elevated on admission but subsequently improved. He was found to be in atrial fibrillation on presentation in the emergency room with no apparent previous documentation of atrial fibrillation. His been on aspirin for antiplatelet therapy. Patient was felt to have suffered a NSTEMI. MRI of his brain was obtained as part of his workup altered mental status. Study showed small likely embolic strokes involving the right posterior frontal and right posterior temporal regions. No focal deficits were described. NIH stroke score is 0. He was last known well at 5:30 PM on 08/08/2013.   LSN: 5:30 PM on 08/08/2013  tPA Given: No: No objective deficits; beyond time window for treatment consideration.  MRankin: 0   SUBJECTIVE  Had CT chest yesterday, has PNA. Cath pushed until today.  Patient just out of cath. Awake, alert. Able to follow commands. Understands that he will need further cardiac testing for evaluation of stroke source TEE/LOOP.  OBJECTIVE Most recent Vital Signs: Filed Vitals:   08/12/13 0000 08/12/13 0200 08/12/13 0400 08/12/13 0600  BP: 147/66 150/72 151/74 157/76  Pulse: 76 74 74   Temp: 99.1 F (37.3 C)  99.5 F (37.5 C)   TempSrc: Oral  Oral   Resp: 24 22 23 24   Height:      Weight:    94.9 kg (209 lb 3.5 oz)  SpO2: 98%  98%    CBG (last 3)   Recent Labs  08/11/13 1408 08/11/13 1724 08/11/13 2149  GLUCAP 109* 92 127*    IV Fluid Intake:   . dextrose 5 % and 0.9% NaCl 50 mL/hr (08/11/13 1300)    MEDICATIONS  . aspirin EC  81 mg Oral Daily  . carvedilol  6.25 mg Oral BID WC  .  enoxaparin (LOVENOX) injection  40 mg Subcutaneous Q24H  . insulin aspart  0-9 Units Subcutaneous TID WC  . insulin glargine  12 Units Subcutaneous QHS  . levofloxacin (LEVAQUIN) IV  750 mg Intravenous Q48H  . potassium chloride  20 mEq Oral Daily  . sodium chloride  3 mL Intravenous Q12H   PRN:  sodium chloride, HYDROcodone-acetaminophen, ondansetron (ZOFRAN) IV, ondansetron, sodium chloride  Diet:  Advance as tolerated Activity:  Bedrest with Bathroom privileges DVT Prophylaxis:  Lovenox  CLINICALLY SIGNIFICANT STUDIES Basic Metabolic Panel:   Recent Labs Lab 08/09/13 0200  08/11/13 0030 08/12/13 0535  NA  --   < > 136 138  K  --   < > 3.6 3.9  CL  --   < > 103 102  CO2  --   < > 25 26  GLUCOSE  --   < > 151* 126*  BUN  --   < > 29* 20  CREATININE  --   < > 1.67* 1.53*  CALCIUM  --   < > 8.5 8.9  MG 2.2  --   --   --   PHOS 4.4  --   --   --   < > = values in this interval not displayed. Liver Function Tests:   Recent Labs Lab 08/09/13 0612 08/10/13 0455  AST 153* 50*  ALT 132* 81*  ALKPHOS 77 72  BILITOT 0.5 0.5  PROT 6.3 6.8  ALBUMIN 2.7* 2.9*   CBC:  Recent Labs Lab 08/08/13 2102  08/11/13 0030 08/12/13 0359  WBC 8.3  < > 5.1 7.5  NEUTROABS 4.5  --   --   --   HGB 13.2  < > 12.3* 12.6*  HCT 41.6  < > 37.4* 38.2*  MCV 97.9  < > 91.2 92.3  PLT 96*  < > 107* 141*  < > = values in this interval not displayed. Coagulation:   Recent Labs Lab 08/09/13 1015  LABPROT 15.1  INR 1.22   Cardiac Enzymes:   Recent Labs Lab 08/10/13 1430 08/10/13 1902 08/11/13 0030  TROPONINI 1.47* 1.10* 1.17*   Urinalysis:   Recent Labs Lab 08/09/13 0112  COLORURINE YELLOW  LABSPEC 1.019  PHURINE 5.0  GLUCOSEU >1000*  HGBUR LARGE*  BILIRUBINUR NEGATIVE  KETONESUR NEGATIVE  PROTEINUR 100*  UROBILINOGEN 1.0  NITRITE NEGATIVE  LEUKOCYTESUR NEGATIVE   Lipid Panel    Component Value Date/Time   CHOL 169 08/09/2013 0200   TRIG 130 08/09/2013 0200   HDL  37* 08/09/2013 0200   CHOLHDL 4.6 08/09/2013 0200   VLDL 26 08/09/2013 0200   LDLCALC 106* 08/09/2013 0200   HgbA1C  Lab Results  Component Value Date   HGBA1C 6.8* 08/09/2013    Urine Drug Screen:     Component Value Date/Time   LABOPIA NONE DETECTED 08/09/2013 0112   COCAINSCRNUR NONE DETECTED 08/09/2013 0112   LABBENZ NONE DETECTED 08/09/2013 0112   AMPHETMU NONE DETECTED 08/09/2013 0112   THCU NONE DETECTED 08/09/2013 0112   LABBARB NONE DETECTED 08/09/2013 0112    Alcohol Level:   Recent Labs Lab 08/09/13 0220  ETH <11    Ct Head Wo Contrast 08/08/2013    1. No acute intracranial process. 2. Stable appearance of extensive global atrophy and chronic microvascular ischemic disease. 3. Remote right cerebellar and right frontoparietal infarcts.     Matthew Brain Wo Contrast 08/09/2013  At least two and probably three acute subcentimeter infarcts as described. Suspect shower of emboli.  Advanced atrophy and chronic microvascular ischemic change with numerous remote infarcts and microbleeds       Dg Chest Portable 1 View 08/08/2013   Negative for edema or consolidation.     CT Chest 1. Right upper lobe airspace consolidation, indicative of pneumonia.  Consider CT chest without contrast in 4-6 weeks to ensure clearing  and exclude primary bronchogenic carcinoma. These results will be  called to the ordering clinician or representative by the  Radiologist Assistant, and communication documented in the PACS  Dashboard.  2. Extensive 3 vessel coronary artery calcification   MRA of the brain    2D Echocardiogram  - ejection fraction 25-30%. No obvious cardiac source of emboli identified. EF unchanged from 2013  Carotid Doppler  Preliminary findings: Bilateral: 1-39% ICA stenosis. Vertebral artery flow is antegrade. Right vertebral demonstrates atypical flow with loss of diastolic component. This could suggest distal obstruction.   EKG  -  sinus rhythm rate 70 beats per  minute  Therapy Recommendations [facility] PTOT  Physical Exam   Neurologic Examination:  Mental Status:  Alert, oriented, thought content appropriate. Speech fluent without evidence of aphasia. Able to follow commands without difficulty.  Cranial Nerves:  II-Visual fields were normal.  III/IV/VI-Pupils were equal and reacted. Extraocular movements were full and conjugate.  V/VII-no facial numbness and no facial weakness.  VIII-normal.  X-normal speech and symmetrical palatal movement.  Motor:  5/5 bilaterally with normal tone and bulk  Sensory: Normal throughout.  Deep Tendon Reflexes: 1+ and symmetric in upper extremities; absent at knees and ankles.  Plantars: Mute bilaterally  Cerebellar: Normal finger-to-nose testing.  Carotid auscultation: Normal    ASSESSMENT Matthew. JAMAREE Bean is a 79 y.o. male presenting with loss of consciousness. TPA was not given - NIH score was 0. MRI revealed small strokes involving the right posterior frontal and right posterior temporal regions. Infarcts felt to be embolic secondary to Atrial fibrillation. On aspirin 81 mg orally every day prior to admission. Now on aspirin 81 mg orally every day for secondary stroke prevention. Patient with resultant minimal deficits. Work up underway.   EF 25-30%, cardiomyopathy  Atrial fibrillation  Diabetes mellitus- hemoglobin A1c 6.8  Hyperlipidemia - cholesterol 169 LDL 106 - would benefit from statin therapy secondary to elevated LDL; however the patient has elevated LFTs and statins cannot be started due to this.  Hypertension - currently controlled. No need to be overly aggressive in setting of recent stroke.  Pneumonia on levaquin  Hospital day # 4  TREATMENT/PLAN  Change to plavix 75mg  daily for secondary stroke prevention at this time.  Therapy recommends home health  Risk factor modification  Heart cath 08/12/2013 showed no aggressive stenosis that could explain cerebrovascular infarcts in  setting of ventricular dysrhythmia. Therefore, will need to proceed with TEE and LOOP. Patient just out from cath. Potential studies today, but most likely in am.  Gwendolyn Lima. Manson Passey, [facility], MBA, MHA [facility] Pager: [phone] 08/12/2013 7:46 AM  I have personally obtained a history, examined the patient, evaluated imaging results, and formulated the assessment and plan of care. I agree with the above. Delia Heady, MD

## 2013-08-12 NOTE — Progress Notes (Signed)
Pt amb to bathroom using walker w/ NT assist.  Returned to bed, audibly wheezing, strong harsh nonproductive cough.  MD paged, order received.  Lungs very diminished.  Robitussin with codeine given po.  Neb tx given by RT.  Wheezing no longer audible.  Bed exit alarm on, reminded pt not to get up w/o assist.  Call light in reach.

## 2013-08-12 NOTE — Progress Notes (Signed)
     SUBJECTIVE: No chest pain or SOB.   BP 149/68  Pulse 74  Temp(Src) 98.6 F (37 C) (Oral)  Resp 24  Ht 6' 1" (1.854 m)  Wt 209 lb 3.5 oz (94.9 kg)  BMI 27.61 kg/m2  SpO2 98%  Intake/Output Summary (Last 24 hours) at 08/12/13 0837 Last data filed at 08/12/13 0700  Gross per 24 hour  Intake   1290 ml  Output   1200 ml  Net     90 ml    PHYSICAL EXAM General: Well developed, well nourished, in no acute distress. Alert and oriented x 3.  Psych:  Good affect, responds appropriately Neck: No JVD. No masses noted.  Lungs: Clear bilaterally with no wheezes or rhonci noted.  Heart: RRR with systolic murmur noted. Abdomen: Bowel sounds are present. Soft, non-tender.  Extremities: No lower extremity edema.   LABS: Basic Metabolic Panel:  Recent Labs  08/11/13 0030 08/12/13 0535  NA 136 138  K 3.6 3.9  CL 103 102  CO2 25 26  GLUCOSE 151* 126*  BUN 29* 20  CREATININE 1.67* 1.53*  CALCIUM 8.5 8.9   CBC:  Recent Labs  08/11/13 0030 08/12/13 0359  WBC 5.1 7.5  HGB 12.3* 12.6*  HCT 37.4* 38.2*  MCV 91.2 92.3  PLT 107* 141*   Cardiac Enzymes:  Recent Labs  08/10/13 1430 08/10/13 1902 08/11/13 0030  TROPONINI 1.47* 1.10* 1.17*   Current Meds: . amLODipine  5 mg Oral Daily  . aspirin EC  81 mg Oral Daily  . carvedilol  12.5 mg Oral BID WC  . enoxaparin (LOVENOX) injection  40 mg Subcutaneous Q24H  . insulin aspart  0-9 Units Subcutaneous TID WC  . insulin glargine  12 Units Subcutaneous QHS  . levofloxacin (LEVAQUIN) IV  750 mg Intravenous Q48H  . potassium chloride  20 mEq Oral Daily  . sodium chloride  3 mL Intravenous Q12H     ASSESSMENT AND PLAN:   1. Ischemic cardiomyopathy: Possible aborted VT/VF arrest prior to admission leading to AMS although also found to have small embolic strokes on MRI brain. NSVT on monitor during admission. He is known to have LVEF=35-40% by echo 08/10/13. Continue beta blocker. No ARB or Ace-inh secondary to renal  insufficiency. Will get EP opinion before discharge in regards to possible ICD with syncopal event, documented NSVT, known ischemic cardiomyopathy with LVEF as low as 25% in past although I am not sure an ICD is appropriate given his current DNR status.   2. CAD/NSTEMI: Last cath 2008 with diffuse multi-vessel disease, PTCA only of RCA lesion as a stent could not be delivered into the vessel. Now with elevated troponin in setting of syncopal event. Will arrange repeat cath today as per plans of Dr. Katz but I am not sure there will be any options for revascularization.   3. Atrial fibrillation: On admission. Now sinus. Will need long term anti-coagulation. Will wait to start after invasive workup complete. TEE today per Neurology plans to exclude intracardiac source for embolus.   4. Chronic kidney disease: Renal function stable this am.    Matthew Bean,Matthew Bean  11/4/20148:37 AM  

## 2013-08-12 NOTE — Progress Notes (Signed)
Paged to see pt. for wheezing, just returned from Endoscopy, pt. remembers numbing spray given orally, placed humidity bottle on Oxygen for suspected upper airway irritation from procedure, RN made aware.

## 2013-08-12 NOTE — Progress Notes (Addendum)
[facility] PROGRESS NOTE  Matthew Bean ZOX:[id number] DOB: 01/23/1933 DOA: 08/08/2013 PCP: [name], MD  Brief narrative: Pt is 79 yo male with complicated medical conditions including HTN, HLD, DM, systolic and diastolic CHF, presented to [facility] ED with main concern of sudden onset altered mental status. Pt is unable to provide history due to confusion and family at bedside unable to provide details as they have not witnessed the events.  -Work up has revealed elevated Troponin, acute small strokes and PNA    Principal Problem: 1. VT (ventricular tachycardia) and Syncope and  CVA - Presumed cause for his syncope was a non sustained episode of VT/VF which may have caused his stroke  -TTE; EF of 35-40%, mild LVH, grade 1 diastolic dysfunction -no new episodes; appreciate cardiology input; Continue beta blocker. No ARB or Ace-inh secondary to renal insufficiency. ? Role of ICD patient is DNR;   2. Pneumonia likely present on admission but not seen on CXR, CT: Right upper lobe airspace consolidation -cont IV atx, bronchodilators, oxygen; blood c/s pend   3. AKI on CKD II; baseline Cr? Around 1.8;  -lasix on hold, Low rate of fluids started today by Cardiology  3. CVA (cerebral infarction); MRI: At least two and probably three acute subcentimeter infarcts - cont ASA 81 mg, control risk factors   4. Transaminitis Improving today, likely due to acute illness  5. DM (diabetes mellitus), type 2, uncontrolled with complications - A1C 6.8; On home lantus and ssi - episode of hypoglycemia likely due to AKI; keep d5% IV while NPO, hold lantus; cont only ISS  6. HYPERTENSION -Lasix on hold, increased carvedilol to 12.5 BID, restarted amlodipine, on 4/11  7. CONGESTIVE HEART FAILURE - Repeat TTE above, appears improved; I/Os, low rate fluids; Holding lasix while NPO  8. CAD/NSTEMI: Last cath 2008 with diffuse multi-vessel disease, PTCA only of RCA  -per cardiology: cath today    D/w patient, wife; patient is DNR;   Consultants:  Cardiology  Neurology  Procedures/Studies: Ct Chest Wo Contrast  08/11/2013   CLINICAL DATA:  Congestion and nonproductive cough. Evaluate for pneumonia.  EXAM: CT CHEST WITHOUT CONTRAST  TECHNIQUE: Multidetector CT imaging of the chest was performed following the standard protocol without IV contrast.  COMPARISON:  07/22/2007.  FINDINGS: Lower right paratracheal lymph node measures 12 mm (previously 10 mm). No definite hilar or axillary adenopathy. A lipoma in the left subscapularis muscle is incidentally noted. Extensive 3 vessel coronary artery calcification. Aortic valvular calcification. Heart size normal. No pericardial effusion. Ascending aorta does not appear dilated.  There is patchy airspace consolidation in the anterior segment right upper lobe. Basilar dependent pulmonary parenchymal scarring and mild architectural distortion, even fibrotic change, appear unchanged. Previously seen 5 mm peripheral right lower lobe nodule is less prominent. No pleural fluid. Airway is unremarkable.  Incidental imaging of the upper abdomen shows no acute findings. There is streak artifact related to the patient's arms. No worrisome lytic or sclerotic lesions. Degenerative changes are seen in the spine.  IMPRESSION: 1. Right upper lobe airspace consolidation, indicative of pneumonia. Consider CT chest without contrast in 4-6 weeks to ensure clearing and exclude primary bronchogenic carcinoma. These results will be called to the ordering clinician or representative by the Radiologist Assistant, and communication documented in the PACS Dashboard. 2. Extensive 3 vessel coronary artery calcification.   Electronically Signed   By: Leanna Battles M.D.   On: 08/11/2013 16:50     Antibiotics:  None  Code: Full  HPI/Subjective: Did well overnight, no acute events.  No complaints this AM.   Objective: Filed Vitals:   08/12/13 0200 08/12/13 0400 08/12/13  0600 08/12/13 0735  BP: 150/72 151/74 157/76 149/68  Pulse: 74 74  74  Temp:  99.5 F (37.5 C)  98.6 F (37 C)  TempSrc:  Oral  Oral  Resp: 22 23 24 24   Height:      Weight:   94.9 kg (209 lb 3.5 oz)   SpO2:  98%  98%    Intake/Output Summary (Last 24 hours) at 08/12/13 0826 Last data filed at 08/12/13 0700  Gross per 24 hour  Intake   1290 ml  Output   1200 ml  Net     90 ml    Exam:   General:  Pt is alert and oriented to person, place, time, follows commands appropriately, not in acute distress  Cardiovascular: Regular rate and rhythm (SR by telemetry), distant heart sounds, 2-3/6 systolic murmur  Respiratory: Clear to auscultation bilaterally, no wheezing  Abdomen: Soft, non tender, bowel sounds present  Extremities: No edema, warm and dry  Neuro: Grossly nonfocal  Data Reviewed: Basic Metabolic Panel:  Recent Labs Lab 08/08/13 2113 08/08/13 2119 08/09/13 0200 08/09/13 0612 08/10/13 0455 08/11/13 0030 08/12/13 0535  NA 138 141  --  137 139 136 138  K 3.6 3.6  --  4.0 3.7 3.6 3.9  CL 100 106  --  104 105 103 102  CO2 15*  --   --  21 25 25 26   GLUCOSE 327* 276*  --  144* 121* 151* 126*  BUN 31* 32*  --  34* 35* 29* 20  CREATININE 2.09* 2.20*  --  1.99* 1.92* 1.67* 1.53*  CALCIUM 8.1*  --   --  8.0* 8.5 8.5 8.9  MG  --   --  2.2  --   --   --   --   PHOS  --   --  4.4  --   --   --   --    Liver Function Tests:  Recent Labs Lab 08/08/13 2113 08/09/13 0612 08/10/13 0455  AST 93* 153* 50*  ALT 59* 132* 81*  ALKPHOS 72 77 72  BILITOT 0.6 0.5 0.5  PROT 6.6 6.3 6.8  ALBUMIN 2.8* 2.7* 2.9*   CBC:  Recent Labs Lab 08/08/13 2102 08/08/13 2119 08/09/13 0612 08/10/13 0455 08/11/13 0030 08/12/13 0359  WBC 8.3  --  5.6 6.2 5.1 7.5  NEUTROABS 4.5  --   --   --   --   --   HGB 13.2 15.0 12.3* 12.2* 12.3* 12.6*  HCT 41.6 44.0 37.3* 37.1* 37.4* 38.2*  MCV 97.9  --  92.1 92.8 91.2 92.3  PLT 96*  --  87* 98* 107* 141*   Cardiac  Enzymes:  Recent Labs Lab 08/09/13 1015 08/09/13 1705 08/10/13 1430 08/10/13 1902 08/11/13 0030  TROPONINI 2.66* 2.35* 1.47* 1.10* 1.17*   BNP: No components found with this basename: POCBNP,  CBG:  Recent Labs Lab 08/11/13 1301 08/11/13 1408 08/11/13 1724 08/11/13 2149 08/12/13 0730  GLUCAP 64* 109* 92 127* 129*    Recent Results (from the past 240 hour(s))  CULTURE, BLOOD (ROUTINE X 2)     Status: None   Collection Time    08/09/13 12:30 AM      Result Value Range Status   Specimen Description BLOOD RIGHT THUMB   Final   Special Requests BOTTLES DRAWN AEROBIC ONLY 6CC  Final   Culture  Setup Time     Final   Value: 08/09/2013 04:03     Performed at [facility]   Culture     Final   Value:        BLOOD CULTURE RECEIVED NO GROWTH TO DATE CULTURE WILL BE HELD FOR 5 DAYS BEFORE ISSUING A FINAL NEGATIVE REPORT     Performed at [facility]   Report Status PENDING   Incomplete  CULTURE, BLOOD (ROUTINE X 2)     Status: None   Collection Time    08/09/13 12:41 AM      Result Value Range Status   Specimen Description BLOOD LEFT HAND   Final   Special Requests BOTTLES DRAWN AEROBIC ONLY 1CC   Final   Culture  Setup Time     Final   Value: 08/09/2013 04:07     Performed at [facility]   Culture     Final   Value:        BLOOD CULTURE RECEIVED NO GROWTH TO DATE CULTURE WILL BE HELD FOR 5 DAYS BEFORE ISSUING A FINAL NEGATIVE REPORT     Performed at [facility]   Report Status PENDING   Incomplete  MRSA PCR SCREENING     Status: None   Collection Time    08/09/13  1:06 AM      Result Value Range Status   MRSA by PCR NEGATIVE  NEGATIVE Final   Comment:            The GeneXpert MRSA Assay (FDA     approved for NASAL specimens     only), is one component of a     comprehensive MRSA colonization     surveillance program. It is not     intended to diagnose MRSA     infection nor to guide or     monitor treatment for     MRSA  infections.  URINE CULTURE     Status: None   Collection Time    08/09/13  1:12 AM      Result Value Range Status   Specimen Description URINE, RANDOM   Final   Special Requests NONE   Final   Culture  Setup Time     Final   Value: 08/09/2013 19:19     Performed at [facility] Count     Final   Value: 5,000 COLONIES/ML     Performed at [facility]   Culture     Final   Value: INSIGNIFICANT GROWTH     Performed at [facility]   Report Status 08/11/2013 FINAL   Final     Scheduled Meds: . aspirin EC  81 mg Oral Daily  . carvedilol  6.25 mg Oral BID WC  . enoxaparin (LOVENOX) injection  40 mg Subcutaneous Q24H  . insulin aspart  0-9 Units Subcutaneous TID WC  . insulin glargine  12 Units Subcutaneous QHS  . levofloxacin (LEVAQUIN) IV  750 mg Intravenous Q48H  . potassium chloride  20 mEq Oral Daily  . sodium chloride  3 mL Intravenous Q12H   Continuous Infusions: . dextrose 5 % and 0.9% NaCl 50 mL/hr (08/11/13 1300)     Esperanza Sheets, MD  [facility] Pager [phone]  If 7PM-7AM, please contact night-coverage [email or web address] Password [email or web address] 08/12/2013, 8:26 AM   LOS: 4 days

## 2013-08-12 NOTE — CV Procedure (Signed)
TEE:  EF 40% inferior wall hypokinesis Prolapse of posterior leaflet mild MR Severely calcified AV with moderate AS by TTE gradient moderate AR No LAA thrombus Atrial septal aneurysm with no PFO Negative bubble study Normal RV No pericardial effusion Normal PV/TV  See full report in [employer]  [employer]

## 2013-08-13 ENCOUNTER — Other Ambulatory Visit: Payer: Self-pay | Admitting: Physician Assistant

## 2013-08-13 ENCOUNTER — Encounter (HOSPITAL_COMMUNITY): Payer: Self-pay | Admitting: Cardiovascular Disease

## 2013-08-13 DIAGNOSIS — R55 Syncope and collapse: Secondary | ICD-10-CM

## 2013-08-13 LAB — CBC
MCH: 30.1 pg (ref 26.0–34.0)
MCHC: 32.5 g/dL (ref 30.0–36.0)
Platelets: 158 10*3/uL (ref 150–400)
RDW: 14.9 % (ref 11.5–15.5)

## 2013-08-13 LAB — PROCALCITONIN: Procalcitonin: 2.36 ng/mL

## 2013-08-13 LAB — GLUCOSE, CAPILLARY: Glucose-Capillary: 112 mg/dL — ABNORMAL HIGH (ref 70–99)

## 2013-08-13 MED ORDER — INSULIN GLARGINE 100 UNIT/ML SOLOSTAR PEN: 8.0000 [IU] | PEN_INJECTOR | Freq: Every evening | SUBCUTANEOUS | Status: DC | PRN

## 2013-08-13 MED ORDER — POTASSIUM CHLORIDE ER 20 MEQ PO TBCR: 10.0000 meq | EXTENDED_RELEASE_TABLET | Freq: Every day | ORAL | Status: AC

## 2013-08-13 MED ORDER — LEVOFLOXACIN 750 MG PO TABS: 750.0000 mg | ORAL_TABLET | Freq: Every day | ORAL | Status: AC

## 2013-08-13 MED ORDER — CLOPIDOGREL BISULFATE 75 MG PO TABS: 75.0000 mg | ORAL_TABLET | Freq: Every day | ORAL | Status: AC

## 2013-08-13 MED ORDER — GLYBURIDE 5 MG PO TABS: 5.0000 mg | ORAL_TABLET | Freq: Every day | ORAL | Status: DC

## 2013-08-13 MED ORDER — LIVING BETTER WITH HEART FAILURE BOOK
Freq: Once | Status: DC
  Filled 2013-08-13: qty 1

## 2013-08-13 MED ORDER — STROKE: EARLY STAGES OF RECOVERY BOOK
Freq: Once | Status: DC
  Filled 2013-08-13: qty 1

## 2013-08-13 NOTE — Progress Notes (Signed)
Stroke Team Progress Note  HISTORY Matthew Bean is a 79 y.o. male with. diabetes mellitus, hypertension, hyperlipidemia, coronary artery disease and congestive heart failure, who was brought to the hospital following an episode of loss of consciousness at home thought  Likely to have been syncopal in nature with possible transient ventricular tachycardia or fibrillation. Lactic acid was elevated on admission but subsequently improved. He was found to be in atrial fibrillation on presentation in the emergency room with no apparent previous documentation of atrial fibrillation. His been on aspirin for antiplatelet therapy. Patient was felt to have suffered a NSTEMI. MRI of his brain was obtained as part of his workup altered mental status. Study showed small likely embolic strokes involving the right posterior frontal and right posterior temporal regions. No focal deficits were described. NIH stroke score is 0. He was last known well at 5:30 PM on 08/08/2013.   LSN: 5:30 PM on 08/08/2013  tPA Given: No: No objective deficits; beyond time window for treatment consideration.  MRankin: 0   SUBJECTIVE Patient up out of bed. Preparing for discharge.  OBJECTIVE Most recent Vital Signs: Filed Vitals:   08/12/13 2159 08/13/13 0007 08/13/13 0453 08/13/13 0830  BP:  119/57  146/65  Pulse: 77 70  77  Temp:  98.1 F (36.7 C) 97.8 F (36.6 C) 97.3 F (36.3 C)  TempSrc:  Oral Oral Oral  Resp: 20 18  16   Height:      Weight:   96.1 kg (211 lb 13.8 oz)   SpO2: 97% 97%  97%   CBG (last 3)   Recent Labs  08/12/13 2125 08/13/13 0829 08/13/13 1242  GLUCAP 148* 102* 112*    IV Fluid Intake:   . sodium chloride 500 mL (08/12/13 1043)  . dextrose 5 % and 0.9% NaCl 50 mL/hr at 08/12/13 0700    MEDICATIONS  .  stroke: mapping our early stages of recovery book   Does not apply Once  . amLODipine  5 mg Oral Daily  . carvedilol  12.5 mg Oral BID WC  . clopidogrel  75 mg Oral Q breakfast  .  enoxaparin (LOVENOX) injection  40 mg Subcutaneous Q24H  . insulin aspart  0-9 Units Subcutaneous TID WC  . levofloxacin (LEVAQUIN) IV  750 mg Intravenous Q48H  . Living Better with Heart Failure Book   Does not apply Once  . potassium chloride  20 mEq Oral Daily   PRN:  acetaminophen, guaiFENesin-codeine, HYDROcodone-acetaminophen, ondansetron (ZOFRAN) IV, ondansetron  Diet:  Advance as tolerated Activity:  Bedrest with Bathroom privileges DVT Prophylaxis:  Lovenox  CLINICALLY SIGNIFICANT STUDIES Basic Metabolic Panel:   Recent Labs Lab 08/09/13 0200  08/11/13 0030 08/12/13 0535  NA  --   < > 136 138  K  --   < > 3.6 3.9  CL  --   < > 103 102  CO2  --   < > 25 26  GLUCOSE  --   < > 151* 126*  BUN  --   < > 29* 20  CREATININE  --   < > 1.67* 1.53*  CALCIUM  --   < > 8.5 8.9  MG 2.2  --   --   --   PHOS 4.4  --   --   --   < > = values in this interval not displayed. Liver Function Tests:   Recent Labs Lab 08/09/13 0612 08/10/13 0455  AST 153* 50*  ALT 132* 81*  ALKPHOS 77  72  BILITOT 0.5 0.5  PROT 6.3 6.8  ALBUMIN 2.7* 2.9*   CBC:  Recent Labs Lab 08/08/13 2102  08/12/13 0359 08/13/13 0353  WBC 8.3  < > 7.5 6.5  NEUTROABS 4.5  --   --   --   HGB 13.2  < > 12.6* 11.8*  HCT 41.6  < > 38.2* 36.3*  MCV 97.9  < > 92.3 92.6  PLT 96*  < > 141* 158  < > = values in this interval not displayed. Coagulation:   Recent Labs Lab 08/09/13 1015  LABPROT 15.1  INR 1.22   Cardiac Enzymes:   Recent Labs Lab 08/10/13 1430 08/10/13 1902 08/11/13 0030  TROPONINI 1.47* 1.10* 1.17*   Urinalysis:   Recent Labs Lab 08/09/13 0112  COLORURINE YELLOW  LABSPEC 1.019  PHURINE 5.0  GLUCOSEU >1000*  HGBUR LARGE*  BILIRUBINUR NEGATIVE  KETONESUR NEGATIVE  PROTEINUR 100*  UROBILINOGEN 1.0  NITRITE NEGATIVE  LEUKOCYTESUR NEGATIVE   Lipid Panel    Component Value Date/Time   CHOL 169 08/09/2013 0200   TRIG 130 08/09/2013 0200   HDL 37* 08/09/2013 0200    CHOLHDL 4.6 08/09/2013 0200   VLDL 26 08/09/2013 0200   LDLCALC 106* 08/09/2013 0200   HgbA1C  Lab Results  Component Value Date   HGBA1C 6.8* 08/09/2013    Urine Drug Screen:     Component Value Date/Time   LABOPIA NONE DETECTED 08/09/2013 0112   COCAINSCRNUR NONE DETECTED 08/09/2013 0112   LABBENZ NONE DETECTED 08/09/2013 0112   AMPHETMU NONE DETECTED 08/09/2013 0112   THCU NONE DETECTED 08/09/2013 0112   LABBARB NONE DETECTED 08/09/2013 0112    Alcohol Level:   Recent Labs Lab 08/09/13 0220  ETH <11    Ct Head Wo Contrast 08/08/2013    1. No acute intracranial process. 2. Stable appearance of extensive global atrophy and chronic microvascular ischemic disease. 3. Remote right cerebellar and right frontoparietal infarcts.     Mr Brain Wo Contrast 08/09/2013  At least two and probably three acute subcentimeter infarcts as described. Suspect shower of emboli.  Advanced atrophy and chronic microvascular ischemic change with numerous remote infarcts and microbleeds       Dg Chest Portable 1 View 08/08/2013   Negative for edema or consolidation.     CT Chest 1. Right upper lobe airspace consolidation, indicative of pneumonia.  Consider CT chest without contrast in 4-6 weeks to ensure clearing  and exclude primary bronchogenic carcinoma. These results will be  called to the ordering clinician or representative by the  Radiologist Assistant, and communication documented in the PACS  Dashboard.  2. Extensive 3 vessel coronary artery calcification   MRA of the brain    2D Echocardiogram  - ejection fraction 25-30%. No obvious cardiac source of emboli identified. EF unchanged from 2013  Carotid Doppler  Preliminary findings: Bilateral: 1-39% ICA stenosis. Vertebral artery flow is antegrade. Right vertebral demonstrates atypical flow with loss of diastolic component. This could suggest distal obstruction.   EKG  -  sinus rhythm rate 70 beats per minute  Therapy Recommendations  [facility] PTOT  Physical Exam   Neurologic Examination:  Mental Status:  Alert, oriented, thought content appropriate. Speech fluent without evidence of aphasia. Able to follow commands without difficulty.  Cranial Nerves:  II-Visual fields were normal.  III/IV/VI-Pupils were equal and reacted. Extraocular movements were full and conjugate.  V/VII-no facial numbness and no facial weakness.  VIII-normal.  X-normal speech and symmetrical palatal movement.  Motor: 5/5 bilaterally with normal tone and bulk  Sensory: Normal throughout.  Deep Tendon Reflexes: 1+ and symmetric in upper extremities; absent at knees and ankles.  Plantars: Mute bilaterally  Cerebellar: Normal finger-to-nose testing.  Carotid auscultation: Normal    ASSESSMENT [name] is a 79 y.o. male presenting with loss of consciousness. TPA was not given - NIH score was 0. MRI revealed small strokes involving the right posterior frontal and right posterior temporal regions. Infarcts felt to be embolic secondary to Atrial fibrillation. On aspirin 81 mg orally every day prior to admission. Now on aspirin 81 mg orally every day for secondary stroke prevention. Patient with resultant minimal deficits. Work up underway.   EF 25-30%, cardiomyopathy  Atrial fibrillation  Diabetes mellitus- hemoglobin A1c 6.8  Hyperlipidemia - cholesterol 169 LDL 106 - would benefit from statin therapy secondary to elevated LDL; however the patient has elevated LFTs and statins cannot be started due to this.  Hypertension - currently controlled. No need to be overly aggressive in setting of recent stroke.  Pneumonia on levaquin  Hospital day # 5  TREATMENT/PLAN  Change to plavix 75mg  daily for secondary stroke prevention at this time.  Risk factor modification  Heart cath 08/12/2013 showed no aggressive stenosis that could explain cerebrovascular infarcts in setting of ventricular dysrhythmia. TEE showed no sign of  cardioembolic source. EP felt that patient would be best for a outpatient monitor.   Patient being discharged today. Have patient follow up with Dr. Pearlean Brownie in 2 months in stroke clinic.  Gwendolyn Lima. Manson Passey, [facility], MBA, MHA [facility] Pager: [phone] 08/13/2013 12:47 PM  I have personally obtained a history, examined the patient, evaluated imaging results, and formulated the assessment and plan of care. I agree with the above. Delia Heady, MD

## 2013-08-13 NOTE — Progress Notes (Signed)
Occupational Therapy Treatment Patient Details Name: Matthew Bean MRN: [id number] DOB: Aug 11, 1933 Today's Date: 08/13/2013 Time: 8295-6213 OT Time Calculation (min): 31 min  OT Assessment / Plan / Recommendation  History of present illness Pt admit with Vtach, STEMI, CHF and AMS. MRI revealed small likely embolic strokes involving the right posterior frontal and right posterior temporal regions    OT comments  Pt making progress with functional goals, however, pt continues to demo impulsivity and decreased safety awareness with memory deficits during functional/ADL mobility tasks. Pt to continue with acute OT services to increase level of function and safety to return home. Pt and family provided with review of safety education for sit - stand transitions/mobility using RW for ADLs and ADL mobility  Follow Up Recommendations  Home health OT;Supervision/Assistance - 24 hour    Barriers to Discharge   None    Equipment Recommendations  None recommended by OT    Recommendations for Other Services    Frequency Min 2X/week   Progress towards OT Goals Progress towards OT goals: Progressing toward goals  Plan Discharge plan remains appropriate    Precautions / Restrictions Precautions Precautions: Fall Precaution Comments: decreased safety awareness Restrictions Weight Bearing Restrictions: No   Pertinent Vitals/Pain No c/o pain    ADL  Grooming: Wash/dry hands;Wash/dry face;Supervision/safety Where Assessed - Grooming: Unsupported standing Upper Body Bathing: Min guard;Supervision/safety;Set up;Simulated Where Assessed - Upper Body Bathing: Unsupported standing Lower Body Bathing: Min guard;Simulated Where Assessed - Lower Body Bathing: Unsupported standing Upper Body Dressing: Supervision/safety;Min guard;Performed Where Assessed - Upper Body Dressing: Unsupported standing Lower Body Dressing: Min guard;Supervision/safety;Performed Toilet Transfer:  Performed;Supervision/safety;Min Pension scheme manager Method: Sit to Barista: Regular height toilet;Grab bars Toileting - Architect and Hygiene: Min guard;Supervision/safety Where Assessed - Engineer, mining and Hygiene: Standing Tub/Shower Transfer: Performed;Supervision/safety;Min guard Web designer Method: Science writer: Grab bars;Walk in shower Equipment Used: Gait belt;Rolling walker Transfers/Ambulation Related to ADLs: verbal cues for hand placement and min guard A for safety, required 3 trials before pt correctly performed sit - stand, stand - sit transitions from chair t RW, RW to toilet and to walk in shower.  Pt's wife/family present ADL Comments: min guard A/sup due to decreased balance and safety awareness    OT Diagnosis:    OT Problem List:   OT Treatment Interventions:     OT Goals(current goals can now be found in the care plan section) Acute Rehab OT Goals Patient Stated Goal: to go home  Visit Information  Last OT Received On: 08/13/13 Assistance Needed: +1 History of Present Illness: Pt admit with Vtach, STEMI, CHF and AMS. MRI revealed small likely embolic strokes involving the right posterior frontal and right posterior temporal regions     Subjective Data      Prior Functioning       Cognition  Cognition Arousal/Alertness: Awake/alert Behavior During Therapy: WFL for tasks assessed/performed Overall Cognitive Status: Impaired/Different from baseline Area of Impairment: Safety/judgement Current Attention Level: Selective Memory: Decreased short-term memory;Decreased recall of precautions Safety/Judgement: Decreased awareness of safety;Decreased awareness of deficits Awareness: Intellectual Problem Solving: Slow processing;Decreased initiation;Requires verbal cues;Difficulty sequencing General Comments: Pt is slow to initiate.  Self distracts    Mobility  Bed  Mobility Bed Mobility: Not assessed Details for Bed Mobility Assistance: pt up in recliner Transfers Transfers: Sit to Stand;Stand to Sit Sit to Stand: 5: Supervision;4: Min guard Stand to Sit: 5: Supervision Details for Transfer Assistance: verbal cues for hand placement and min  guard A for safety, required 3 trials before pt correctly performed sit - stand, stand - sit transitions from chair t RW, RW to toilet.  Pt's wife/family present    Exercises      Balance Balance Balance Assessed: Yes Static Standing Balance Static Standing - Balance Support: During functional activity;No upper extremity supported Static Standing - Level of Assistance: 5: Stand by assistance Dynamic Standing Balance Dynamic Standing - Balance Support: No upper extremity supported;During functional activity Dynamic Standing - Level of Assistance: 5: Stand by assistance;Other (comment) (min guard A)   End of Session OT - End of Session Equipment Utilized During Treatment: Gait belt;Rolling walker Activity Tolerance: Patient tolerated treatment well Patient left: in chair;with call bell/phone within reach;with family/visitor present  GO     Galen Manila 08/13/2013, 2:39 PM

## 2013-08-13 NOTE — Discharge Summary (Addendum)
Physician Discharge Summary  Matthew Bean EAV:[id number] DOB: Mar 29, 1933 DOA: 08/08/2013  PCP: [name], MD  Admit date: 08/08/2013 Discharge date: 08/13/2013  Time spent: >35 minutes  Recommendations for Outpatient Follow-up:  F/u with cardiology for event monitoring  Discharge Diagnoses:  Principal Problem:   VT (ventricular tachycardia) Active Problems:   DM (diabetes mellitus), type 2, uncontrolled with complications   HYPERLIPIDEMIA   HYPERTENSION   CONGESTIVE HEART FAILURE   Altered mental state   Acute on chronic renal failure   Acute respiratory failure with hypoxia   Lactic acidosis   Transaminitis   Syncope   CVA (cerebral infarction)   Discharge Condition: stable   Diet recommendation: heart healthy   Filed Weights   08/11/13 0411 08/12/13 0600 08/13/13 0453  Weight: 96.5 kg (212 lb 11.9 oz) 94.9 kg (209 lb 3.5 oz) 96.1 kg (211 lb 13.8 oz)    History of present illness:  Pt is 79 yo male with complicated medical conditions including HTN, HLD, DM, systolic and diastolic CHF, presented to [facility] ED with main concern of sudden onset altered mental status. Pt is unable to provide history due to confusion and family at bedside unable to provide details as they have not witnessed the events.  -Work up has revealed elevated Troponin, acute small strokes and PNA    Hospital Course:  Principal Problem:  1. VT (ventricular tachycardia) and Syncope and CVA  - Presumed cause for his syncope was a non sustained episode of VT/VF which may have caused his stroke  -no new episodes of VT in the hospital; TTE; EF of 35-40%, mild LVH, grade 1 diastolic dysfunction  -appreciate cardiology input; not a good candidate for ICD or long term anticoagulation; Continue beta blocker. No ARB or Ace-inh secondary to renal insufficiency. patient is DNR;  2. Pneumonia likely present on admission but not seen on CXR, CT: Right upper lobe airspace consolidation  -blood c/s NGT; afebrile;  to complete outpatient atx 3. AKI on CKD II; baseline Cr? Around 1.8; cont outpatient f/u  3. CVA (cerebral infarction); MRI: At least two and probably three acute subcentimeter infarcts  - per neurology recommended plavix; d/w cardiology who will arrange event monitor outpatient  4. Transaminitis Improving today, likely due to acute illness  5. DM (diabetes mellitus), type 2, uncontrolled with complications  - A1C 6.8; On home lantus prn; hold lantus due to episode of hypoglycemia likely due to AKI;  -resume PO meds decreased to 5 mg Q breakfast; outpatient follow up  6. HYPERTENSION resume BB, amlodipine;  7. CONGESTIVE HEART FAILURE, AS - Repeat TTE above, appears improved; I/Os, cont lasix prn  8. CAD/NSTEMI: Last cath 2008 with diffuse multi-vessel disease, PTCA only of RCA  -per cardiology: s/p LHC: diffuse, CAD no significant change from 2008; cont medical treatment   D/w patient, wife; patient is DNR;  -arrange HHC/PT RN   Consultants:  Cardiology  Neurology Procedures/Studies:  Ct Chest Wo Contrast  08/11/2013 CLINICAL DATA: Congestion and nonproductive cough. Evaluate for pneumonia. EXAM: CT CHEST WITHOUT CONTRAST TECHNIQUE: Multidetector CT imaging of the chest was performed following the standard protocol without IV contrast. COMPARISON: 07/22/2007. FINDINGS: Lower right paratracheal lymph node measures 12 mm (previously 10 mm). No definite hilar or axillary adenopathy. A lipoma in the left subscapularis muscle is incidentally noted. Extensive 3 vessel coronary artery calcification. Aortic valvular calcification. Heart size normal. No pericardial effusion. Ascending aorta does not appear dilated. There is patchy airspace consolidation in the anterior segment right  upper lobe. Basilar dependent pulmonary parenchymal scarring and mild architectural distortion, even fibrotic change, appear unchanged. Previously seen 5 mm peripheral right lower lobe nodule is less prominent. No pleural  fluid. Airway is unremarkable. Incidental imaging of the upper abdomen shows no acute findings. There is streak artifact related to the patient's arms. No worrisome lytic or sclerotic lesions. Degenerative changes are seen in the spine. IMPRESSION: 1. Right upper lobe airspace consolidation, indicative of pneumonia. Consider CT chest without contrast in 4-6 weeks to ensure clearing and exclude primary bronchogenic carcinoma. These results will be called to the ordering clinician or representative by the Radiologist Assistant, and communication documented in the PACS Dashboard. 2. Extensive 3 vessel coronary artery calcification. Electronically Signed By: Leanna Battles M.D. On: 08/11/2013 16:50   Consultations:  Cardiology, neurology   Discharge Exam: Filed Vitals:   08/13/13 0830  BP: 146/65  Pulse: 77  Temp: 97.3 F (36.3 C)  Resp: 16    General: alert Cardiovascular: s1,s2 rrr Respiratory: CTA BL   Discharge Instructions  Discharge Orders   Future Appointments Provider Department Dept Phone   08/27/2013 1:30 PM Rosalio Macadamia, NP [facility] [place] Office [phone]   08/29/2013 11:00 AM [facility] [place] Office [phone]   10/24/2013 9:15 AM Lindley Magnus, MD  [facility] [place] [phone]   Future Orders Complete By Expires   Diet - low sodium heart healthy  As directed    Discharge instructions  As directed    Comments:     Please follow up with cardiologist in 1 week Matthew Hazel, MD Schedule an appointment as soon as possible for a visit in 1 week  [address] [place] [address] [phone]   Increase activity slowly  As directed        Medication List    STOP taking these medications       aspirin EC 81 MG tablet     LANTUS SOLOSTAR 100 UNIT/ML Sopn  Generic drug:  Insulin Glargine      TAKE these medications       amLODipine 10 MG tablet  Commonly known as:  NORVASC  Take  10 mg by mouth daily.     clopidogrel 75 MG tablet  Commonly known as:  PLAVIX  Take 1 tablet (75 mg total) by mouth daily with breakfast.     furosemide 20 MG tablet  Commonly known as:  LASIX  Take 20 mg by mouth daily as needed for edema.     glyBURIDE 5 MG tablet  Commonly known as:  DIABETA  Take 1 tablet (5 mg total) by mouth daily with breakfast.     hydrochlorothiazide 25 MG tablet  Commonly known as:  HYDRODIURIL  Take 25 mg by mouth daily.     levofloxacin 750 MG tablet  Commonly known as:  LEVAQUIN  Take 1 tablet (750 mg total) by mouth daily.     memantine 5 MG tablet  Commonly known as:  NAMENDA  Take 5 mg by mouth 2 (two) times daily.     methotrexate 2.5 MG tablet  Commonly known as:  RHEUMATREX  Take 12.5 mg by mouth once a week.     metoprolol 50 MG tablet  Commonly known as:  LOPRESSOR  Take 1 tablet (50 mg total) by mouth 2 (two) times daily.     nitroGLYCERIN 0.4 MG SL tablet  Commonly known as:  NITROSTAT  Place 0.4 mg under the tongue  every 5 (five) minutes as needed. For chest pain     ONE TOUCH TEST STRIPS test strip  Generic drug:  glucose blood  1 each by Other route as needed. Use as instructed     Potassium Chloride ER 20 MEQ Tbcr  Take 10 mEq by mouth daily.     RELION MINI PEN NEEDLES 31G X 6 MM Misc  Generic drug:  Insulin Pen Needle  USE EVERY DAY       No Known Allergies     Follow-up Information   Follow up with [name], MD In 1 week.   Specialties:  Internal Medicine, Radiology   Contact information:   [address] [place] [address] [phone]       Follow up with Norma Fredrickson, NP. [facility] - 08/27/13 at 1:30pm)    Specialty:  Nurse Practitioner   Contact information:   [address] [place] [address] [phone]       Follow up with [facility]. (Our office will call you with a time to pick  up your heart monitor.)    Contact information:   [address] [place] [address]        The results of significant diagnostics from this hospitalization (including imaging, microbiology, ancillary and laboratory) are listed below for reference.    Significant Diagnostic Studies: Ct Head Wo Contrast  08/08/2013   CLINICAL DATA:  Unresponsive  EXAM: CT HEAD WITHOUT CONTRAST  TECHNIQUE: Contiguous axial images were obtained from the base of the skull through the vertex without intravenous contrast.  COMPARISON:  Prior CT from 06/02/2012  FINDINGS: Extensive age-related atrophy and chronic microvascular ischemic disease there is again seen, similar as compared to the prior examination. Ventricular size is unchanged. Prominent bilateral basal ganglia calcifications are noted. No acute intracranial hemorrhage or large vessel territory infarct. No extra-axial fluid collection. Remote right cerebellar infarct and right parietal infarcts again noted. No mass lesion or midline shift. Calvarium is intact. Orbital soft tissues are within normal limits. The paranasal sinuses and mastoid air cells are clear.  IMPRESSION: 1. No acute intracranial process. 2. Stable appearance of extensive global atrophy and chronic microvascular ischemic disease. 3. Remote right cerebellar and right frontoparietal infarcts.   Electronically Signed   By: Rise Mu M.D.   On: 08/08/2013 22:34   Ct Chest Wo Contrast  08/11/2013   CLINICAL DATA:  Congestion and nonproductive cough. Evaluate for pneumonia.  EXAM: CT CHEST WITHOUT CONTRAST  TECHNIQUE: Multidetector CT imaging of the chest was performed following the standard protocol without IV contrast.  COMPARISON:  07/22/2007.  FINDINGS: Lower right paratracheal lymph node measures 12 mm (previously 10 mm). No definite hilar or axillary adenopathy. A lipoma in the left subscapularis muscle is incidentally noted. Extensive 3 vessel coronary artery  calcification. Aortic valvular calcification. Heart size normal. No pericardial effusion. Ascending aorta does not appear dilated.  There is patchy airspace consolidation in the anterior segment right upper lobe. Basilar dependent pulmonary parenchymal scarring and mild architectural distortion, even fibrotic change, appear unchanged. Previously seen 5 mm peripheral right lower lobe nodule is less prominent. No pleural fluid. Airway is unremarkable.  Incidental imaging of the upper abdomen shows no acute findings. There is streak artifact related to the patient's arms. No worrisome lytic or sclerotic lesions. Degenerative changes are seen in the spine.  IMPRESSION: 1. Right upper lobe airspace consolidation, indicative of pneumonia. Consider  CT chest without contrast in 4-6 weeks to ensure clearing and exclude primary bronchogenic carcinoma. These results will be called to the ordering clinician or representative by the Radiologist Assistant, and communication documented in the PACS Dashboard. 2. Extensive 3 vessel coronary artery calcification.   Electronically Signed   By: Leanna Battles M.D.   On: 08/11/2013 16:50   Mr Brain Wo Contrast  08/09/2013   CLINICAL DATA:  Sudden onset of altered mental status. Stroke risk factors include hypertension, hyperlipidemia, diabetes, and renal failure.  EXAM: MRI HEAD WITHOUT CONTRAST  TECHNIQUE: Multiplanar, multisequence MR imaging was performed. No intravenous contrast was administered.  COMPARISON:  CT head 08/08/2013.  FINDINGS: Acute subcentimeter right frontal cortical infarction (image 26 series 3). Acute left posterior frontal subcentimeter cortical infarct (image 29 series 3). Probable acute right posterior temporal subcentimeter infarct (image 12 series 3), persists on thin DWI series. Attention to this area on followup MR as clinically indicated.  Severe cerebral and cerebellar atrophy. Remote bilateral cortical infarcts with brain substance loss, most notable  right parietal region. Remote bilateral right greater than left cerebellar infarcts. Extensive chronic microvascular ischemic change throughout the periventricular and subcortical white matter.  Small foci of chronic hemorrhage are scattered throughout both cerebral hemispheres likely sequelae of hypertensive cerebrovascular disease. No midline shift. Flow voids are maintained. No osseous lesions. No acute mastoid disease. Small amount of left sphenoid sinus fluid could be acute.  IMPRESSION: At least two and probably three acute subcentimeter infarcts as described. Suspect shower of emboli.  Advanced atrophy and chronic microvascular ischemic change with numerous remote infarcts and microbleeds   Electronically Signed   By: Davonna Belling M.D.   On: 08/09/2013 12:08   Dg Chest Portable 1 View  08/08/2013   CLINICAL DATA:  Altered mental status. Hypertension. Atrial fibrillation  EXAM: PORTABLE CHEST - 1 VIEW  COMPARISON:  06/02/2012  FINDINGS: Chronic cardiomegaly, stable from prior. Mediastinal contours distorted by rightward rotation. Chronic interstitial coarsening at the bases. No acute infiltrate or edema. No effusion or pneumothorax. Linear high attenuation overlapping the lower right neck is likely either the thyroid cartilage or external to the patient.No history of neck pain.  IMPRESSION: Negative for edema or consolidation.   Electronically Signed   By: Tiburcio Pea M.D.   On: 08/08/2013 21:26    Microbiology: Recent Results (from the past 240 hour(s))  CULTURE, BLOOD (ROUTINE X 2)     Status: None   Collection Time    08/09/13 12:30 AM      Result Value Range Status   Specimen Description BLOOD RIGHT THUMB   Final   Special Requests BOTTLES DRAWN AEROBIC ONLY 6CC   Final   Culture  Setup Time     Final   Value: 08/09/2013 04:03     Performed at [facility]   Culture     Final   Value:        BLOOD CULTURE RECEIVED NO GROWTH TO DATE CULTURE WILL BE HELD FOR 5 DAYS BEFORE  ISSUING A FINAL NEGATIVE REPORT     Performed at [facility]   Report Status PENDING   Incomplete  CULTURE, BLOOD (ROUTINE X 2)     Status: None   Collection Time    08/09/13 12:41 AM      Result Value Range Status   Specimen Description BLOOD LEFT HAND   Final   Special Requests BOTTLES DRAWN AEROBIC ONLY 1CC   Final   Culture  Setup  Time     Final   Value: 08/09/2013 04:07     Performed at [facility]   Culture     Final   Value:        BLOOD CULTURE RECEIVED NO GROWTH TO DATE CULTURE WILL BE HELD FOR 5 DAYS BEFORE ISSUING A FINAL NEGATIVE REPORT     Performed at [facility]   Report Status PENDING   Incomplete  MRSA PCR SCREENING     Status: None   Collection Time    08/09/13  1:06 AM      Result Value Range Status   MRSA by PCR NEGATIVE  NEGATIVE Final   Comment:            The GeneXpert MRSA Assay (FDA     approved for NASAL specimens     only), is one component of a     comprehensive MRSA colonization     surveillance program. It is not     intended to diagnose MRSA     infection nor to guide or     monitor treatment for     MRSA infections.  URINE CULTURE     Status: None   Collection Time    08/09/13  1:12 AM      Result Value Range Status   Specimen Description URINE, RANDOM   Final   Special Requests NONE   Final   Culture  Setup Time     Final   Value: 08/09/2013 19:19     Performed at [facility] Count     Final   Value: 5,000 COLONIES/ML     Performed at [facility]   Culture     Final   Value: INSIGNIFICANT GROWTH     Performed at [facility]   Report Status 08/11/2013 FINAL   Final     Labs: Basic Metabolic Panel:  Recent Labs Lab 08/08/13 2113 08/08/13 2119 08/09/13 0200 08/09/13 0612 08/10/13 0455 08/11/13 0030 08/12/13 0535  NA 138 141  --  137 139 136 138  K 3.6 3.6  --  4.0 3.7 3.6 3.9  CL 100 106  --  104 105 103 102  CO2 15*  --   --  21 25 25 26   GLUCOSE 327* 276*  --   144* 121* 151* 126*  BUN 31* 32*  --  34* 35* 29* 20  CREATININE 2.09* 2.20*  --  1.99* 1.92* 1.67* 1.53*  CALCIUM 8.1*  --   --  8.0* 8.5 8.5 8.9  MG  --   --  2.2  --   --   --   --   PHOS  --   --  4.4  --   --   --   --    Liver Function Tests:  Recent Labs Lab 08/08/13 2113 08/09/13 0612 08/10/13 0455  AST 93* 153* 50*  ALT 59* 132* 81*  ALKPHOS 72 77 72  BILITOT 0.6 0.5 0.5  PROT 6.6 6.3 6.8  ALBUMIN 2.8* 2.7* 2.9*   No results found for this basename: LIPASE, AMYLASE,  in the last 168 hours No results found for this basename: AMMONIA,  in the last 168 hours CBC:  Recent Labs Lab 08/08/13 2102  08/09/13 0612 08/10/13 0455 08/11/13 0030 08/12/13 0359 08/13/13 0353  WBC 8.3  --  5.6 6.2 5.1 7.5 6.5  NEUTROABS 4.5  --   --   --   --   --   --  HGB 13.2  < > 12.3* 12.2* 12.3* 12.6* 11.8*  HCT 41.6  < > 37.3* 37.1* 37.4* 38.2* 36.3*  MCV 97.9  --  92.1 92.8 91.2 92.3 92.6  PLT 96*  --  87* 98* 107* 141* 158  < > = values in this interval not displayed. Cardiac Enzymes:  Recent Labs Lab 08/09/13 1015 08/09/13 1705 08/10/13 1430 08/10/13 1902 08/11/13 0030  TROPONINI 2.66* 2.35* 1.47* 1.10* 1.17*   BNP: BNP (last 3 results)  Recent Labs  08/08/13 2103 08/09/13 0220 08/09/13 0612  PROBNP 2702.0* 3120.0* 5068.0*   CBG:  Recent Labs Lab 08/12/13 1211 08/12/13 1624 08/12/13 1703 08/12/13 2125 08/13/13 0829  GLUCAP 73 63* 73 148* 102*       Signed:  Rice Walsh N  [facility] 08/13/2013, 10:17 AM

## 2013-08-13 NOTE — Progress Notes (Signed)
Pt pleasantly confused, denies complaints.  Received robitussin/codeine twice last night for harsh dry cough.  Bed very wet with urine.  Full linen change and peri care done. Up to bathroom w/ walker and one assist, tolerates fair, steady on feet but needs help getting up.  Follows commands.  Call light in reach, yellow armband, red socks, bed exit alarm on.  Becomes sob w/ audible exp wheezing with ambulating 10 feet, Ow sat 86% on RA.  Placed on 2L per [place], recovers in about 5 minutes to normal.

## 2013-08-13 NOTE — Progress Notes (Signed)
Patient ready for discharge with family. Wife has some anxiety for patient discharge for full supervision at home. I explained and we have set up and offered all assistance for home health that can be provided and also approved for insurance. Patient is not total care and is able to ambulate with walker with stand by assist. PT consult done and their recommendation will be followed and they will also follow him at home and further assess for any other needs. Social work ordered for home to assess at home for further assistance. Home RN to follow him at home and Nurse aid not approved with uinsurance.Patient already received walker in Sept 2014 insurance did not approve for another one at this time wife is offered to buy at her expense but she declined. Wife does not want patient go to nursing home for 24 hour care. I discussed with her having her family to come and assist with checking in on the patient as well. Patient is stable moves all extremites and doing well slight confusion to time otherwise orientated to people and situation. Mostly needs supervision for safety. I requested MD to come and talk with family and he did. I provided education and discharge instructions to family also. MD feels patient is stable to go home no further reason to keep him at this time.

## 2013-08-13 NOTE — Progress Notes (Signed)
     SUBJECTIVE: No complaints. No chest pain or SOB.   BP 119/57  Pulse 70  Temp(Src) 97.8 F (36.6 C) (Oral)  Resp 18  Ht 6\' 1"  (1.854 m)  Wt 211 lb 13.8 oz (96.1 kg)  BMI 27.96 kg/m2  SpO2 97%  Intake/Output Summary (Last 24 hours) at 08/13/13 1610 Last data filed at 08/13/13 0400  Gross per 24 hour  Intake 1024.17 ml  Output    401 ml  Net 623.17 ml    PHYSICAL EXAM General: Well developed, well nourished, in no acute distress. Alert and oriented x 3.  Psych:  Good affect, responds appropriately Neck: No JVD. No masses noted.  Lungs: Clear bilaterally with no wheezes or rhonci noted.  Heart: RRR with systolic murmur noted. Abdomen: Bowel sounds are present. Soft, non-tender.  Extremities: No lower extremity edema.   LABS: Basic Metabolic Panel:  Recent Labs  96/04/54 0030 08/12/13 0535  NA 136 138  K 3.6 3.9  CL 103 102  CO2 25 26  GLUCOSE 151* 126*  BUN 29* 20  CREATININE 1.67* 1.53*  CALCIUM 8.5 8.9   CBC:  Recent Labs  08/12/13 0359 08/13/13 0353  WBC 7.5 6.5  HGB 12.6* 11.8*  HCT 38.2* 36.3*  MCV 92.3 92.6  PLT 141* 158   Cardiac Enzymes:  Recent Labs  08/10/13 1430 08/10/13 1902 08/11/13 0030  TROPONINI 1.47* 1.10* 1.17*   Current Meds: . amLODipine  5 mg Oral Daily  . carvedilol  12.5 mg Oral BID WC  . clopidogrel  75 mg Oral Q breakfast  . enoxaparin (LOVENOX) injection  40 mg Subcutaneous Q24H  . insulin aspart  0-9 Units Subcutaneous TID WC  . levofloxacin (LEVAQUIN) IV  750 mg Intravenous Q48H  . potassium chloride  20 mEq Oral Daily     ASSESSMENT AND PLAN:  1. Syncope: Possible aborted VT/VF arrest prior to admission leading to AMS although also found to have small embolic strokes on MRI brain. NSVT on monitor during admission. No VT noted over last 3 days. Post CVA planning per Neurology. Have discussed case in depth with EP team and do not feel that he will be a candidate for ICD (see below). Will plan event monitor  on discharge. We do not feel that he is a good candidate for long term anti-coagulation at this time. If that changes, could consider implantable LOOP recorder. Would likely get some benefit from ASA 325 mg po Qdaily.   2. Ischemic cardiomyopathy: He is known to have LVEF=35-40% by echo 08/10/13. Continue beta blocker. No ARB or Ace-inh secondary to renal insufficiency. Would not consider ICD at this time given dementia, advanced age, poor functional status, DNR status.    3. CAD/NSTEMI: CAD stable by cath 08/12/13. Continue medical management.    4. Atrial fibrillation: Reported on admission. Sinus since he has has been here. Even in setting of his likely acute neurological events, he does not appear to be a good candidate for long term anticoagulation with history of medication non-compliance, dementia, advanced age. TEE 08/12/13 without evidence of intra-cardiac thrombus.    5. Chronic kidney disease: Renal function stable.   6. Aortic stenosis: Moderate.    Matthew Bean  11/5/20147:23 AM

## 2013-08-13 NOTE — Care Management Note (Signed)
    Page 1 of 2   08/13/2013     11:04:15 AM   CARE MANAGEMENT NOTE 08/13/2013  Patient:  Matthew Bean, Matthew Bean   Account Number:  [id number]  Date Initiated:  08/13/2013  Documentation initiated by:  [facility]  Subjective/Objective Assessment:   79 yo male with complicated and multiple medical conditions including HTN, HLD, DM, systolic and diastolic CHF, now presented to [facility] ED with main concern of sudden onset altered mental status./ Hm with spouse     Action/Plan:   Heart cath/ home with [facility]   Anticipated DC Date:  08/13/2013   Anticipated DC Plan:  HOME W HOME HEALTH SERVICES      DC Planning Services  CM consult      PAC Choice  DURABLE MEDICAL EQUIPMENT  HOME HEALTH   Choice offered to / List presented to:  C-1 Patient   DME arranged  Levan Hurst      DME agency  [facility]     HH arranged  HH-1 RN  HH-2 PT      [facility] agency  [facility]   Status of service:  Completed, signed off Medicare Important Message given?   (If response is "NO", the following Medicare IM given date fields will be blank) Date Medicare IM given:   Date Additional Medicare IM given:    Discharge Disposition:    Per UR Regulation:    If discussed at Long Length of Stay Meetings, dates discussed:    Comments:  08/13/13 1015 Oletta Cohn, RN, BSN, NCM [phone] CM spoke with patient concerning discharge planning. Pt offered choice for [facility] for [facility] services upon discharge. Per pt choice [facility] to provide [facility] services of RN and PT.  [facility] rep, Kizzie Furnish  contacted concerning new referral. Pt to discharge home with spouse.  DME needs include rolling walker.

## 2013-08-15 LAB — CULTURE, BLOOD (ROUTINE X 2)

## 2013-08-19 ENCOUNTER — Encounter: Payer: Self-pay | Admitting: *Deleted

## 2013-08-19 ENCOUNTER — Encounter (HOSPITAL_BASED_OUTPATIENT_CLINIC_OR_DEPARTMENT_OTHER): Payer: Self-pay

## 2013-08-19 DIAGNOSIS — R55 Syncope and collapse: Secondary | ICD-10-CM

## 2013-08-19 NOTE — Progress Notes (Signed)
Patient ID: Matthew Bean, male   DOB: 08/22/1933, 79 y.o.   MRN: [id number] E=Cardio Braemar 30 day cardiac event monitor applied to patient.

## 2013-08-20 ENCOUNTER — Other Ambulatory Visit: Payer: Self-pay | Admitting: Internal Medicine

## 2013-08-23 ENCOUNTER — Emergency Department (HOSPITAL_COMMUNITY)
Admission: EM | Admit: 2013-08-23 | Discharge: 2013-09-08 | Disposition: E | Payer: Medicare Other | Attending: Emergency Medicine | Admitting: Emergency Medicine

## 2013-08-23 ENCOUNTER — Encounter (HOSPITAL_COMMUNITY): Payer: Self-pay | Admitting: Emergency Medicine

## 2013-08-23 DIAGNOSIS — I2510 Atherosclerotic heart disease of native coronary artery without angina pectoris: Secondary | ICD-10-CM | POA: Insufficient documentation

## 2013-08-23 DIAGNOSIS — Z860100 Personal history of colon polyps, unspecified: Secondary | ICD-10-CM | POA: Insufficient documentation

## 2013-08-23 DIAGNOSIS — I469 Cardiac arrest, cause unspecified: Secondary | ICD-10-CM | POA: Insufficient documentation

## 2013-08-23 DIAGNOSIS — I509 Heart failure, unspecified: Secondary | ICD-10-CM | POA: Insufficient documentation

## 2013-08-23 DIAGNOSIS — I10 Essential (primary) hypertension: Secondary | ICD-10-CM | POA: Insufficient documentation

## 2013-08-23 DIAGNOSIS — I252 Old myocardial infarction: Secondary | ICD-10-CM | POA: Insufficient documentation

## 2013-08-23 DIAGNOSIS — Z862 Personal history of diseases of the blood and blood-forming organs and certain disorders involving the immune mechanism: Secondary | ICD-10-CM | POA: Insufficient documentation

## 2013-08-23 DIAGNOSIS — Z8669 Personal history of other diseases of the nervous system and sense organs: Secondary | ICD-10-CM | POA: Insufficient documentation

## 2013-08-23 DIAGNOSIS — Z87891 Personal history of nicotine dependence: Secondary | ICD-10-CM | POA: Insufficient documentation

## 2013-08-23 DIAGNOSIS — Z79899 Other long term (current) drug therapy: Secondary | ICD-10-CM | POA: Insufficient documentation

## 2013-08-23 DIAGNOSIS — E119 Type 2 diabetes mellitus without complications: Secondary | ICD-10-CM | POA: Insufficient documentation

## 2013-08-23 DIAGNOSIS — M069 Rheumatoid arthritis, unspecified: Secondary | ICD-10-CM | POA: Insufficient documentation

## 2013-08-23 DIAGNOSIS — Z7902 Long term (current) use of antithrombotics/antiplatelets: Secondary | ICD-10-CM | POA: Insufficient documentation

## 2013-08-23 DIAGNOSIS — Z8639 Personal history of other endocrine, nutritional and metabolic disease: Secondary | ICD-10-CM | POA: Insufficient documentation

## 2013-08-23 DIAGNOSIS — Z87448 Personal history of other diseases of urinary system: Secondary | ICD-10-CM | POA: Insufficient documentation

## 2013-08-23 DIAGNOSIS — Z8709 Personal history of other diseases of the respiratory system: Secondary | ICD-10-CM | POA: Insufficient documentation

## 2013-08-23 DIAGNOSIS — Z9861 Coronary angioplasty status: Secondary | ICD-10-CM | POA: Insufficient documentation

## 2013-08-27 ENCOUNTER — Encounter: Payer: Medicare Other | Admitting: Nurse Practitioner

## 2013-09-08 NOTE — ED Notes (Signed)
Family in Consultation Room A  Waiting for more family members

## 2013-09-08 NOTE — ED Notes (Signed)
Dr. Norlene Campbell called the CPR.  No pulse, no resp, no heart activity.

## 2013-09-08 NOTE — ED Notes (Signed)
Patient cleaned and prepared for the family to visit.  Patient moved to Room 26.

## 2013-09-08 NOTE — Progress Notes (Signed)
Chaplain paged to ED for CPR in progress. Chaplain provided pastoral care and emotional support to the grieving family. Chaplain provided spiritual support through prayer. Chaplain provided the patient's wife with the card and telephone number for Direct Placement.  Aug 25, 2013 0247  Clinical Encounter Type  Visited With Family  Visit Type Initial;Spiritual support;Death;ED  Referral From Nurse  Spiritual Encounters  Spiritual Needs Prayer;Emotional;Grief support  Stress Factors  Family Stress Factors Health changes;Loss;Major life changes

## 2013-09-08 NOTE — ED Provider Notes (Signed)
CSN: [id number]     Arrival date & time 09-19-13  0244 History   First MD Initiated Contact with Patient Sep 19, 2013 0249     Chief Complaint  Patient presents with  . Cardiac Arrest   (Consider location/radiation/quality/duration/timing/severity/associated sxs/prior Treatment) HPI 79 year old male presents emergency department from home after cardiac arrest.  EMS reports patient had witnessed arrest at around 2 AM.  EMS reports PEA upon their arrival.  He was intubated and CPR initiated.  He received a total of 8 epinephrine.  They briefly rate.  Will get pulses.  Back, but this lasted only a few seconds.  He has been bradycardic on the monitor in the 30s without a pulse.  Patient recently admitted to the hospital with altered mental status.  He was noted to have periods of V. tach in-house, and was sent home on a cardiac event monitor.  Patient has history of diabetes, CAD with stents, hyperlipidemia, CHF, hypertension, MI, CVA, renal sufficiency, and dementia. Past Medical History  Diagnosis Date  . Hyperlipidemia   . Diabetes mellitus   . CHF (congestive heart failure)   . Hypertension   . Myocardial infarction   . Nephropathy   . Renal insufficiency   . CAD (coronary artery disease)   . Pulmonary nodule   . Rheumatoid arthritis(714.0)   . Hx of adenomatous colonic polyps    Past Surgical History  Procedure Laterality Date  . Angioplasty  1997, 2008  . Coronary stent placement  2002    coronary  . Hernia repair      Inguinal and ventral  . Colonoscopy w/ polypectomy  02/20/2011    8 polyps removed, largest 1 cm, mix of adenomas, lymphoid aggregates and hyperplastic  . Tee without cardioversion N/A 08/12/2013    Procedure: TRANSESOPHAGEAL ECHOCARDIOGRAM (TEE);  Surgeon: Wendall Stade, MD;  Location: [facility] ENDOSCOPY;  Service: Cardiovascular;  Laterality: N/A;   Family History  Problem Relation Age of Onset  . Stroke Mother   . Stroke Father    History  Substance Use Topics  .  Smoking status: Former Smoker    Quit date: 02/06/1989  . Smokeless tobacco: Former Neurosurgeon    Quit date: 02/06/2006  . Alcohol Use: No    Review of Systems  Unable to perform ROS: Intubated    Allergies  Review of patient's allergies indicates no known allergies.  Home Medications   Current Outpatient Rx  Name  Route  Sig  Dispense  Refill  . amLODipine (NORVASC) 10 MG tablet   Oral   Take 10 mg by mouth daily.         . clopidogrel (PLAVIX) 75 MG tablet   Oral   Take 1 tablet (75 mg total) by mouth daily with breakfast.   30 tablet   1   . furosemide (LASIX) 20 MG tablet   Oral   Take 20 mg by mouth daily as needed for edema.         Marland Kitchen glucose blood (ONE TOUCH TEST STRIPS) test strip   Other   1 each by Other route as needed. Use as instructed         . glyBURIDE (DIABETA) 5 MG tablet      TAKE ONE TABLET BY MOUTH TWICE DAILY WITH MEALS   180 tablet   0   . hydrochlorothiazide (HYDRODIURIL) 25 MG tablet   Oral   Take 25 mg by mouth daily.         Marland Kitchen levofloxacin (LEVAQUIN) 750  MG tablet   Oral   Take 1 tablet (750 mg total) by mouth daily.   5 tablet   0   . memantine (NAMENDA) 5 MG tablet   Oral   Take 5 mg by mouth 2 (two) times daily.         . methotrexate (RHEUMATREX) 2.5 MG tablet   Oral   Take 12.5 mg by mouth once a week.         . metoprolol (LOPRESSOR) 50 MG tablet   Oral   Take 1 tablet (50 mg total) by mouth 2 (two) times daily.   60 tablet   3   . nitroGLYCERIN (NITROSTAT) 0.4 MG SL tablet   Sublingual   Place 0.4 mg under the tongue every 5 (five) minutes as needed. For chest pain         . potassium chloride 20 MEQ TBCR   Oral   Take 10 mEq by mouth daily.   20 tablet   0   . RELION MINI PEN NEEDLES 31G X 6 MM MISC      USE EVERY DAY   100 each   0    Pulse 0  Resp  Physical Exam  Vitals reviewed. Constitutional:  Elderly-appearing male, intubated, with Samuel Bouche, device, unresponsive  HENT:  Head:  Normocephalic and atraumatic.  ET tube in place  Eyes:  Pupils are fixed and dilated  Cardiovascular:  No heart tones auscultated  Pulmonary/Chest:  Patient with bilateral breath sounds with bagging  Abdominal: Soft.  Musculoskeletal: He exhibits edema.  Skin: Skin is warm and dry.    ED Course  [place] bedside Date/Time: September 13, 2013 3:19 AM Performed by: Olivia Mackie Authorized by: Olivia Mackie Consent: The procedure was performed in an emergent situation. Comments:   EMERGENCY DEPARTMENT [place] CARDIAC EXAM "Study: Limited Ultrasound of the heart and pericardium"  INDICATIONS:Cardiac arrest Multiple views of the heart and pericardium are obtained with a multi-frequency probe.  PERFORMED ZO:XWRUEA  IMAGES ARCHIVED?: No  FINDINGS: No cardiac activity  LIMITATIONS:  Emergent procedure  VIEWS USED: Subcostal 4 chamber, Parasternal long axis and Parasternal short axis  INTERPRETATION: Cardiac activity absent  COMMENT:      (including critical care time) Labs Review Labs Reviewed - No data to display Imaging Review No results found.  EKG Interpretation   None       MDM   1. Cardiac arrest    79 year old male with cardiac arrest 45 minutes ago.  No response to epinephrine.  Patient is pulseless, asystole on the monitor.  No cardiac activity on bedside ultrasound.  Time of death pronounced at 2:47 in the morning.  Case was discussed with on-call physician for Dr. Cato Mulligan.  Death certificate to be sent to their office.  This is not an ME case.    Olivia Mackie, MD 09-13-2013 [phone]

## 2013-09-08 NOTE — ED Notes (Signed)
Family taken to room 26 to view the patient

## 2013-09-08 NOTE — ED Notes (Signed)
Patient presents via EMS with CPR in progress.  EMS stated that he was seen at 0145 and told his wife he was feeling fine.  About 2AM wife noticed he was not breathing (witnessed arrest).  EMS inserted 8.0 ET with 27 at the lips.  EMS administered 8 epi, 1 amp D50 and NS.  EMS was able to get pulses back but only about 30 beats of PEA, no shocks adm.  They arrived here with the [facility] in place and bagging the patient.  Dr. Norlene Campbell at bedside to do FAST of the heart (no activity noted.)

## 2013-09-08 NOTE — ED Notes (Signed)
Family leaving.  Will call in the AM with the name of a funeral home.  Patient being prepared for the morgue

## 2013-09-08 DEATH — deceased

## 2013-10-21 ENCOUNTER — Encounter: Payer: Self-pay | Admitting: *Deleted

## 2013-10-21 NOTE — Progress Notes (Signed)
Patient ID: Matthew Bean, male   DOB: 04/23/1933, 81 y.o.   MRN: [id number] No charge applied for cardiac event monitor started 08/19/2013, ending 08-31-2013, incomplete term of service.

## 2013-10-24 ENCOUNTER — Ambulatory Visit: Payer: Medicare Other | Admitting: Internal Medicine

## 2013-11-26 ENCOUNTER — Telehealth: Payer: Self-pay | Admitting: Internal Medicine

## 2013-11-26 NOTE — Telephone Encounter (Signed)
Melody from [employer] is calling in regards to deceased pt, states the insurance denied a claim back on 03/13/2012 because of incomplete directions. Melody is needed specific directions, rx was written 01/11/12, the max daily dose rx lantus solarstar. Melody needs a letter overhead from the doctor so she resubmit the claim.

## 2013-11-28 NOTE — Telephone Encounter (Signed)
Ok to provide letter

## 2013-12-02 ENCOUNTER — Encounter: Payer: Self-pay | Admitting: *Deleted

## 2013-12-02 NOTE — Telephone Encounter (Signed)
Letter faxed.

## 2014-01-27 NOTE — Telephone Encounter (Signed)
Error/gd °

## 2014-09-17 ENCOUNTER — Encounter (HOSPITAL_COMMUNITY): Payer: Self-pay | Admitting: Cardiovascular Disease
